# Patient Record
Sex: Male | Born: 1955
Health system: Southern US, Community
[De-identification: ages and names within clinical notes are randomized; demographics above are authoritative.]

## PROBLEM LIST (undated history)

## (undated) DIAGNOSIS — E119 Type 2 diabetes mellitus without complications: Secondary | ICD-10-CM

## (undated) DIAGNOSIS — N189 Chronic kidney disease, unspecified: Secondary | ICD-10-CM

## (undated) DIAGNOSIS — R011 Cardiac murmur, unspecified: Secondary | ICD-10-CM

## (undated) DIAGNOSIS — K219 Gastro-esophageal reflux disease without esophagitis: Secondary | ICD-10-CM

## (undated) DIAGNOSIS — I10 Essential (primary) hypertension: Secondary | ICD-10-CM

## (undated) DIAGNOSIS — N200 Calculus of kidney: Secondary | ICD-10-CM

## (undated) HISTORY — PX: COLONOSCOPY: SHX174

## (undated) HISTORY — DX: Type 2 diabetes mellitus without complications: E11.9

## (undated) HISTORY — DX: Calculus of kidney: N20.0

## (undated) HISTORY — PX: LITHOTRIPSY: SUR834

## (undated) HISTORY — DX: Cardiac murmur, unspecified: R01.1

---

## 2001-03-05 ENCOUNTER — Ambulatory Visit (HOSPITAL_COMMUNITY): Admission: RE | Admit: 2001-03-05 | Discharge: 2001-03-05 | Payer: Self-pay | Admitting: Pulmonary Disease

## 2009-05-12 ENCOUNTER — Encounter: Admission: RE | Admit: 2009-05-12 | Discharge: 2009-05-12 | Payer: Self-pay | Admitting: Urology

## 2009-10-06 ENCOUNTER — Emergency Department (HOSPITAL_COMMUNITY): Admission: EM | Admit: 2009-10-06 | Discharge: 2009-10-06 | Payer: Self-pay | Admitting: Family Medicine

## 2009-10-20 ENCOUNTER — Emergency Department (HOSPITAL_COMMUNITY): Admission: EM | Admit: 2009-10-20 | Discharge: 2009-10-20 | Payer: Self-pay | Admitting: Emergency Medicine

## 2010-11-18 LAB — DIFFERENTIAL
Basophils Absolute: 0.1 10*3/uL (ref 0.0–0.1)
Basophils Relative: 0 % (ref 0–1)
Basophils Relative: 1 % (ref 0–1)
Eosinophils Absolute: 0 10*3/uL (ref 0.0–0.7)
Eosinophils Relative: 2 % (ref 0–5)
Monocytes Absolute: 0.6 10*3/uL (ref 0.1–1.0)
Monocytes Absolute: 0.8 10*3/uL (ref 0.1–1.0)
Neutro Abs: 8.8 10*3/uL — ABNORMAL HIGH (ref 1.7–7.7)
Neutrophils Relative %: 61 % (ref 43–77)

## 2010-11-18 LAB — POCT URINALYSIS DIP (DEVICE)
Bilirubin Urine: NEGATIVE
Glucose, UA: NEGATIVE mg/dL
Hgb urine dipstick: NEGATIVE
Ketones, ur: 40 mg/dL — AB
Nitrite: NEGATIVE
Protein, ur: NEGATIVE mg/dL
Specific Gravity, Urine: 1.025 (ref 1.005–1.030)
pH: 7 (ref 5.0–8.0)

## 2010-11-18 LAB — POCT I-STAT, CHEM 8
BUN: 19 mg/dL (ref 6–23)
Calcium, Ion: 1.13 mmol/L (ref 1.12–1.32)
Calcium, Ion: 1.14 mmol/L (ref 1.12–1.32)
Chloride: 107 mEq/L (ref 96–112)
HCT: 41 % (ref 39.0–52.0)
Hemoglobin: 13.9 g/dL (ref 13.0–17.0)
Potassium: 3.6 mEq/L (ref 3.5–5.1)
Potassium: 3.8 mEq/L (ref 3.5–5.1)
TCO2: 29 mmol/L (ref 0–100)

## 2010-11-18 LAB — CBC
MCHC: 34.3 g/dL (ref 30.0–36.0)
Platelets: 155 10*3/uL (ref 150–400)
RDW: 13.4 % (ref 11.5–15.5)
WBC: 6.8 10*3/uL (ref 4.0–10.5)

## 2012-02-06 ENCOUNTER — Other Ambulatory Visit: Payer: Self-pay | Admitting: Urology

## 2012-02-09 ENCOUNTER — Encounter (HOSPITAL_COMMUNITY): Payer: Self-pay | Admitting: *Deleted

## 2012-02-09 NOTE — Progress Notes (Signed)
Asked bring folder sign all forms that have a patient signature and a nurse will co sign the form,insurance card,ID,take laxative between 5 pm and 6 pm and eat a light dinner 02-22-12.  Clear liquids until 0745 am then NPO for procedure 02-23-12  Do not take any Aspirin,Advil.Ibuprofen or Toradol 72 hours prior to procedure.  Arrive at 1145 am 02-23-12 to the [facility].   Able to verbalize all instructions without difficulty.

## 2012-02-21 ENCOUNTER — Encounter (HOSPITAL_COMMUNITY): Payer: Self-pay | Admitting: Pharmacy Technician

## 2012-02-23 ENCOUNTER — Ambulatory Visit (HOSPITAL_COMMUNITY): Payer: 59

## 2012-02-23 ENCOUNTER — Encounter (HOSPITAL_COMMUNITY)
Admission: RE | Disposition: A | Discharge status: Home / Self Care | Payer: Self-pay | Source: Ambulatory Visit | Attending: Urology

## 2012-02-23 ENCOUNTER — Ambulatory Visit (HOSPITAL_COMMUNITY)
Admission: RE | Admit: 2012-02-23 | Discharge: 2012-02-23 | Disposition: A | Discharge status: Home / Self Care | Payer: 59 | Source: Ambulatory Visit | Attending: Urology | Admitting: Urology

## 2012-02-23 ENCOUNTER — Encounter (HOSPITAL_COMMUNITY): Payer: Self-pay | Admitting: *Deleted

## 2012-02-23 DIAGNOSIS — N200 Calculus of kidney: Secondary | ICD-10-CM | POA: Insufficient documentation

## 2012-02-23 DIAGNOSIS — I10 Essential (primary) hypertension: Secondary | ICD-10-CM | POA: Insufficient documentation

## 2012-02-23 DIAGNOSIS — E119 Type 2 diabetes mellitus without complications: Secondary | ICD-10-CM | POA: Insufficient documentation

## 2012-02-23 HISTORY — DX: Essential (primary) hypertension: I10

## 2012-02-23 HISTORY — DX: Chronic kidney disease, unspecified: N18.9

## 2012-02-23 LAB — GLUCOSE, CAPILLARY: Glucose-Capillary: 88 mg/dL (ref 70–99)

## 2012-02-23 SURGERY — LITHOTRIPSY, ESWL
Anesthesia: LOCAL | Laterality: Left

## 2012-02-23 MED ORDER — DIPHENHYDRAMINE HCL 25 MG PO CAPS
ORAL_CAPSULE | ORAL | Status: AC
  Administered 2012-02-23: 25 mg via ORAL
  Filled 2012-02-23: qty 1

## 2012-02-23 MED ORDER — DIPHENHYDRAMINE HCL 25 MG PO CAPS
25.0000 mg | ORAL_CAPSULE | ORAL | Status: AC
  Administered 2012-02-23: 25 mg via ORAL

## 2012-02-23 MED ORDER — DIAZEPAM 5 MG PO TABS
10.0000 mg | ORAL_TABLET | ORAL | Status: AC
  Administered 2012-02-23: 10 mg via ORAL

## 2012-02-23 MED ORDER — DIAZEPAM 5 MG PO TABS
ORAL_TABLET | ORAL | Status: AC
  Administered 2012-02-23: 10 mg via ORAL
  Filled 2012-02-23: qty 2

## 2012-02-23 MED ORDER — DEXTROSE-NACL 5-0.45 % IV SOLN
INTRAVENOUS | Status: DC
  Administered 2012-02-23: 13:00:00 via INTRAVENOUS

## 2012-02-23 MED ORDER — OXYCODONE-ACETAMINOPHEN 5-325 MG PO TABS: 1.0000 | ORAL_TABLET | ORAL | Status: AC | PRN

## 2012-02-23 NOTE — H&P (Signed)
  Urology History and Physical Exam  CC: kidney stone  HPI: 56 year old male who presents at this time for lithotripsy. I saw him on 6 June, 2013 in followup for gross hematuria. Evaluation included a CT scan and cystoscopy. He has small renal cysts, but are branched left lower pole renal stone. Hounsfield unit was approximately 1100, skin to sun distance was only about 7 cm. He presents at this time for lithotripsy as primary therapy for his symptomatic left lower pole stone.  PMH: Past Medical History  Diagnosis Date  . Hypertension   . Diabetes mellitus   . Chronic kidney disease     PSH: Past Surgical History  Procedure Date  . Lithotripsy     3 yrs ago    Allergies: No Known Allergies  Medications: Prescriptions prior to admission  Medication Sig Dispense Refill  . amLODipine (NORVASC) 5 MG tablet Take 5 mg by mouth every morning.      Marland Kitchen atorvastatin (LIPITOR) 10 MG tablet Take 10 mg by mouth every evening.      Marland Kitchen lisinopril (PRINIVIL,ZESTRIL) 10 MG tablet Take 10 mg by mouth every morning.      . metFORMIN (GLUMETZA) 500 MG (MOD) 24 hr tablet Take 500 mg by mouth daily with breakfast.      . Multiple Vitamins-Minerals (CENTRUM SILVER PO) Take 1 tablet by mouth daily.      Marland Kitchen acetaminophen (TYLENOL) 500 MG tablet Take 1,000 mg by mouth every 6 (six) hours as needed. PAIN      . ascorbic acid (VITAMIN C) 500 MG tablet Take 500 mg by mouth daily.      Marland Kitchen aspirin EC 81 MG tablet Take 81 mg by mouth daily.      . sildenafil (VIAGRA) 100 MG tablet Take 50 mg by mouth daily as needed. ED         Social History: History   Social History  . Marital Status: Married    Spouse Name: N/A    Number of Children: N/A  . Years of Education: N/A   Occupational History  . Not on file.   Social History Main Topics  . Smoking status: Never Smoker   . Smokeless tobacco: Not on file  . Alcohol Use: No  . Drug Use: No  . Sexually Active: Yes   Other Topics Concern  . Not on  file   Social History Narrative  . No narrative on file    Family History: History reviewed. No pertinent family history.  Review of Systems: Genitourinary, constitutional, skin, eye, otolaryngeal, hematologic/lymphatic, cardiovascular, pulmonary, endocrine, musculoskeletal, gastrointestinal, neurological and psychiatric system(s) were reviewed and pertinent findings if present are noted.  Genitourinary: hematuria.     Physical Exam: @VITALS2 @ General: No acute distress.  Awake. Head:  Normocephalic.  Atraumatic. ENT:  EOMI.  Mucous membranes moist Neck:  Supple.  No lymphadenopathy. CV:  S1 present. S2 present. Regular rate. Pulmonary: Equal effort bilaterally.  Clear to auscultation bilaterally. Abdomen: Soft.  Non- tender to palpation. Skin:  Normal turgor.  No visible rash.   Studies:  No results found for this basename: HGB:2,WBC:2,PLT:2 in the last 72 hours  No results found for this basename: NA:2,K:2,CL:2,CO2:2,BUN:2,CREATININE:2,CALCIUM:2,MAGNESIUM:2,GFRNONAA:2,GFRAA:2 in the last 72 hours   No results found for this basename: PT:2,INR:2,APTT:2 in the last 72 hours   No components found with this basename: ABG:2    Assessment:  Left lower pole renal calculus  Plan: lithotripsy

## 2012-02-23 NOTE — Discharge Instructions (Signed)
See [facility] discharge instructions in chart. Lithotripsy for Kidney Stones WHAT ARE KIDNEY STONES? The kidneys filter blood for chemicals the body cannot use. These waste chemicals are eliminated in the urine. They are removed from the body. Under some conditions, these chemicals may become concentrated. When this happens, they form crystals in the urine. When these crystals build up and stick together, stones may form. When these stones block the flow of urine through the urinary tract, they may cause severe pain. The urinary tract is very sensitive to blockage and stretching by the stone. WHAT IS LITHOTRIPSY? Lithotripsy is a treatment that can sometimes help eliminate kidney stones and pain faster. A form of lithotripsy, also known as ESWL (extracorporeal shock wave lithotripsy), is a nonsurgical procedure that helps your body rid itself of the kidney stone with a minimum amount of pain. EWSL is a method of crushing a kidney stone with shock waves. These shock waves pass through your body. They cause the kidney stones to crumble while still in the urinary tract. It is then easier for the smaller pieces of stone to pass in the urine. Lithotripsy usually takes about an hour. It is done in a hospital, a lithotripsy center, or a mobile unit. It usually does not require an overnight stay. Your caregiver will instruct you on preparation for the procedure. Your caregiver will tell you what to expect afterward. LET YOUR CAREGIVER KNOW ABOUT:  Allergies.   Medicines taken including herbs, eye drops, over the counter medicines (including aspirin, aleve, or motrin for treatment of inflammatory conditions) and creams.   Use of steroids (by mouth or creams).   Previous problems with anesthetics or novocaine.   Possibility of pregnancy, if this applies.   History of blood clots (thrombophlebitis).   History of bleeding or blood problems.   Previous surgery.   Other health problems.  RISKS  AND COMPLICATIONS Complications of lithotripsy are uncommon, but include the following:  Infection.   Bleeding of the kidney.   Bruising of the kidney or skin.   Obstruction of the ureter (the passageway from the kidney to the bladder).   Failure of the stone to fragment (break apart).  PROCEDURE A stent (flexible tube with holes) may be placed in your ureter. The ureter is the tube that transports the urine from the kidneys to the bladder. Your caregiver may place a stent before the procedure. This will help keep urine flowing from the kidney if the fragments of the stone block the ureter. You may receive an intravenous (IV) line to give you fluids and medicines. These medicines may help you relax or make you sleep. During the procedure, you will lie comfortably on a fluid-filled cushion or in a warm-water bath. After an x-ray or ultrasound locates your stone, shock waves are aimed at the stone. If you are awake, you may feel a tapping sensation (feeling) as the shock waves pass through your body. If large stone particles remain after treatment, a second procedure may be necessary at a later date. For comfort during the test:  Relax as much as possible.   Try to remain still as much as possible.   Try to follow instructions to speed up the test.   Let your caregiver know if you are uncomfortable, anxious, or in pain.  AFTER THE PROCEDURE  After surgery, you will be taken to the recovery area. A nurse will watch and check your progress. Once you're awake, stable, and taking fluids well, you will be  allowed to go home as long as there are no problems. You may be prescribed antibiotics (medicines that kill germs) to help prevent infection. You may also be prescribed pain medicine if needed. In a week or two, your doctor may remove your stent, if you have one. Your caregiver will check to see whether or not stone particles remain. PASSING THE STONE It may take anywhere from a day to several  weeks for the stone particles to leave your body. During this time, drink at least 8 to 12 eight ounce glasses of water every day. It is normal for your urine to be cloudy or slightly bloody for a few weeks following this procedure. You may even see small pieces of stone in your urine. A slight fever and some pain are also normal. Your caregiver may ask you to strain your urine to collect some stone particles for chemical analysis. If you find particles while straining the urine, save them. Analysis tells you and the caregiver what the stone is made of. Knowing this may help prevent future stones. PREVENTING FUTURE STONES  Drink about 8 to 12, eight-ounce glasses of water every day.   Follow the diet your caregiver recommends.   Take your prescribed medicine.   See your caregiver regularly for checkups.  SEEK IMMEDIATE MEDICAL CARE IF:  You develop an oral temperature above 102 F (38.9 C), or as your caregiver suggests.   Your pain is not relieved by medicine.   You develop nausea (feeling sick to your stomach) and vomiting.   You develop heavy bleeding.   You have difficulty urinating.  Document Released: 08/12/2000 Document Revised: 08/04/2011 Document Reviewed: 06/06/2008 [facility] Patient Information 2012 [place], [place].

## 2012-02-24 ENCOUNTER — Encounter (HOSPITAL_COMMUNITY): Payer: Self-pay

## 2016-12-20 ENCOUNTER — Ambulatory Visit (INDEPENDENT_AMBULATORY_CARE_PROVIDER_SITE_OTHER)

## 2016-12-20 ENCOUNTER — Ambulatory Visit (HOSPITAL_COMMUNITY)
Admission: RE | Admit: 2016-12-20 | Discharge: 2016-12-20 | Disposition: A | Discharge status: Home / Self Care | Payer: Commercial Managed Care - HMO | Source: Ambulatory Visit | Attending: Physician Assistant | Admitting: Physician Assistant

## 2016-12-20 ENCOUNTER — Ambulatory Visit: Payer: Self-pay

## 2016-12-20 ENCOUNTER — Ambulatory Visit: Admitting: Physician Assistant

## 2016-12-20 VITALS — BP 127/85 | HR 71 | Temp 98.4°F | Resp 17 | Ht 69.5 in | Wt 168.0 lb

## 2016-12-20 DIAGNOSIS — R51 Headache: Secondary | ICD-10-CM

## 2016-12-20 DIAGNOSIS — M542 Cervicalgia: Secondary | ICD-10-CM

## 2016-12-20 DIAGNOSIS — S43102A Unspecified dislocation of left acromioclavicular joint, initial encounter: Secondary | ICD-10-CM

## 2016-12-20 DIAGNOSIS — M25512 Pain in left shoulder: Secondary | ICD-10-CM

## 2016-12-20 DIAGNOSIS — E785 Hyperlipidemia, unspecified: Secondary | ICD-10-CM | POA: Insufficient documentation

## 2016-12-20 DIAGNOSIS — F0781 Postconcussional syndrome: Secondary | ICD-10-CM

## 2016-12-20 DIAGNOSIS — R519 Headache, unspecified: Secondary | ICD-10-CM

## 2016-12-20 DIAGNOSIS — I152 Hypertension secondary to endocrine disorders: Secondary | ICD-10-CM | POA: Insufficient documentation

## 2016-12-20 DIAGNOSIS — E1169 Type 2 diabetes mellitus with other specified complication: Secondary | ICD-10-CM | POA: Insufficient documentation

## 2016-12-20 DIAGNOSIS — I10 Essential (primary) hypertension: Secondary | ICD-10-CM | POA: Insufficient documentation
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
## 2016-12-20 NOTE — Patient Instructions (Addendum)
Return for follow up with me in 48 hours.   Post-Concussion Syndrome Post-concussion syndrome is the symptoms that can occur after a head injury. These symptoms can last from weeks to months. Follow these instructions at home:  Take medicines only as told by your doctor.  Do not take aspirin.  Sleep with your head raised to help with headaches.  Avoid activities that can cause another head injury.  Do not play contact sports like football, hockey, soccer, or basketball.  Do not do other risky activities like downhill skiing, martial arts, or horseback riding until your doctor says it is okay.  Keep all follow-up visits as told by your doctor. This is important. Contact a doctor if:  You have a harder time:  Paying attention.  Focusing.  Remembering.  Learning new information.  Dealing with stress.  You need more time to complete tasks.  You are easily bothered (irritable).  You have more symptoms. Get help if you have any of these symptoms for more than two weeks after your injury:  Long-lasting (chronic) headaches.  Dizziness.  Trouble balancing.  Feeling sick to your stomach (nauseous).  Trouble with your vision.  Noise or light bothers you more.  Depression.  Mood swings.  Feeling worried (anxious).  Easily bothered.  Memory problems.  Trouble concentrating or paying attention.  Sleep problems.  Feeling tired all of the time. Get help right away if:  You feel confused.  You feel very sleepy.  You are hard to wake up.  You feel sick to your stomach.  You keep throwing up (vomiting).  You feel like you are moving when you are not (vertigo).  Your eyes move back and forth very quickly.  You start shaking (convulsing) or pass out (faint).  You have very bad headaches that do not get better with medicine.  You cannot use your arms or legs like normal.  One of the black centers of your eyes (pupils) is bigger than the other.  You  have clear or bloody fluid coming from your nose or ears.  Your problems get worse, not better. This information is not intended to replace advice given to you by your health care provider. Make sure you discuss any questions you have with your health care provider. Document Released: 09/22/2004 Document Revised: 01/21/2016 Document Reviewed: 11/20/2013 Elsevier Interactive Patient Education  2017 [employer]  Acromioclavicular Separation Acromioclavicular separation is an injury to the small joint at the top of the shoulder (acromioclavicular joint or AC joint). The [facility] joint connects the outer tip of the collarbone (clavicle) to the top of the shoulder blade (acromion). Two strong cords of tissue (acromioclavicular ligament and coracoclavicular ligament) stretch across the [facility] joint to keep it in place. An AC joint separation happens when one or both ligaments stretch or tear, causing the joint to separate. There are six types of separation. The type of separation that you have depends on how much the ligaments are damaged and how far the joint has moved out of place. The less severe types of separation are most common. What are the causes? Common causes of this condition include:  A hard, direct hit (blow) to the top of the shoulder.  Falling on the shoulder.  Falling on an outstretched arm. What increases the risk? This condition is more likely to develop in male athletes under age 28 who participate in sports that involve potential contact, such as:  Football.  Rugby.  Hockey.  Cycling.  Martial arts. What are  the signs or symptoms?   The main symptom of this condition is shoulder pain. Pain may be mild or severe, depending on the type of separation. Other signs and symptoms in the shoulder may include:  Swelling.  Limited range of motion, especially when moving the arm across the body.  Pain and tenderness when touching the top of the shoulder.  Pain when putting weight  on the shoulder, such as rolling on the shoulder while sleeping.  A visible bump (deformity) over the joint.  A clicking or popping sound when moving the shoulder. How is this diagnosed? This condition may be diagnosed based on:  Your symptoms.  Your medical history, including your history of recent injuries.  A physical exam to check for deformity and limited range of motion.  Imaging tests, such as:  X-rays.  MRI.  Ultrasound. How is this treated? Treatment for this condition may include:  Resting the shoulder before gradually returning to normal activities.  Icing the shoulder.  NSAIDs to help reduce pain and swelling.  A sling to support your shoulder and keep it from moving.  Physical therapy.  Surgery. This is rare. Surgery may be needed for severe injuries that include breaks (fractures) in a bone, or injuries that do not get better with nonsurgical treatments. Surgery is followed by keeping your joint in place for a period of time (immobilization) and physical therapy. Follow these instructions at home: If you have a sling:   Wear the sling as told by your health care provider. Remove it only as told by your health care provider.  Reposition the sling if your fingers tingle, become numb, or turn cold and blue.  Do not let your sling get wet if it is not waterproof. Ask your health care provider if you can remove the sling for bathing and showering.  Keep the sling clean. Managing pain, stiffness, and swelling    If directed, apply ice to the injured area.  Put ice in a plastic bag.  Place a towel between your skin and the bag.  Leave the ice on for 20 minutes, 2-3 times a day.  Move your fingers often to avoid stiffness and to lessen swelling. Driving   Do not drive or operate heavy machinery while taking prescription pain medicine.  Ask your health care provider when it is safe for you to drive. Activity   Rest and return to your normal  activities as told by your health care provider. Ask your health care provider what activities are safe for you.  Do exercises as told by your health care provider. General instructions   Do not use any tobacco products, such as cigarettes, chewing tobacco, and e-cigarettes. Tobacco can delay healing. If you need help quitting, ask your health care provider.  Take over-the-counter and prescription medicines only as told by your health care provider.  Keep all follow-up visits as told by your health care provider. This is important. How is this prevented?  Make sure to use equipment that fits you.  Wear shoulder padding during contact sports.  Be safe and responsible while being active to avoid falls. Contact a health care provider if:  Your pain and stiffness do not improve after 2 weeks. This information is not intended to replace advice given to you by your health care provider. Make sure you discuss any questions you have with your health care provider. Document Released: 08/15/2005 Document Revised: 04/21/2016 Document Reviewed: 07/18/2015 Elsevier Interactive Patient Education  2017 [employer].  IF you received an x-ray today, you will receive an invoice from [facility]. Please contact [facility] at [phone] with questions or concerns regarding your invoice.   IF you received labwork today, you will receive an invoice from [facility]. Please contact [facility] at [phone] with questions or concerns regarding your invoice.   Our billing staff will not be able to assist you with questions regarding bills from these companies.  You will be contacted with the lab results as soon as they are available. The fastest way to get your results is to activate your My Chart account. Instructions are located on the last page of this paperwork. If you have not heard from us regarding the results in 2 weeks, please contact this office.     

## 2016-12-21 ENCOUNTER — Telehealth: Payer: Self-pay | Admitting: Physician Assistant
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
## 2016-12-22 ENCOUNTER — Other Ambulatory Visit: Payer: Commercial Managed Care - HMO

## 2016-12-22 NOTE — Telephone Encounter (Signed)
I have completed the paperwork and placed it with RN Santiago Glad to be faxed.

## 2016-12-22 NOTE — Telephone Encounter (Signed)
Faxed

## 2016-12-23 ENCOUNTER — Ambulatory Visit (INDEPENDENT_AMBULATORY_CARE_PROVIDER_SITE_OTHER)

## 2016-12-23 ENCOUNTER — Encounter: Payer: Self-pay | Admitting: Physician Assistant

## 2016-12-23 ENCOUNTER — Ambulatory Visit (INDEPENDENT_AMBULATORY_CARE_PROVIDER_SITE_OTHER): Admitting: Physician Assistant

## 2016-12-23 VITALS — BP 136/88 | HR 79 | Temp 98.4°F | Resp 16 | Wt 173.2 lb

## 2016-12-23 DIAGNOSIS — S43102D Unspecified dislocation of left acromioclavicular joint, subsequent encounter: Secondary | ICD-10-CM

## 2016-12-23 DIAGNOSIS — S43102A Unspecified dislocation of left acromioclavicular joint, initial encounter: Secondary | ICD-10-CM

## 2016-12-23 DIAGNOSIS — F0781 Postconcussional syndrome: Secondary | ICD-10-CM
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
## 2016-12-23 NOTE — Patient Instructions (Addendum)
Plain films suggest a type 1 AC joint separation. This typically takes anywhere from 3 days to 2 weeks to fully heal.   I recommend you rest, ice, and protect the arm in a sling. Ice can be applied for 15 minutes every four to six hours as needed. Rest includes avoiding overhead reaching, reaching across the chest, lifting, leaning on the elbows, and sleeping directly on the shoulder.  Range-of-motion exercises are recommended as soon as they can be tolerated. They are provided below.   Follow up with me on 01/03/17.     Acromioclavicular Separation Acromioclavicular separation is an injury to the small joint at the top of the shoulder (acromioclavicular joint or AC joint). The [facility] joint connects the outer tip of the collarbone (clavicle) to the top of the shoulder blade (acromion). Two strong cords of tissue (acromioclavicular ligament and coracoclavicular ligament) stretch across the [facility] joint to keep it in place. An AC joint separation happens when one or both ligaments stretch or tear, causing the joint to separate. There are six types of separation. The type of separation that you have depends on how much the ligaments are damaged and how far the joint has moved out of place. The less severe types of separation are most common. What are the causes? Common causes of this condition include:  A hard, direct hit (blow) to the top of the shoulder.  Falling on the shoulder.  Falling on an outstretched arm. What increases the risk? This condition is more likely to develop in male athletes under age 48 who participate in sports that involve potential contact, such as:  Football.  Rugby.  Hockey.  Cycling.  Martial arts. What are the signs or symptoms?   The main symptom of this condition is shoulder pain. Pain may be mild or severe, depending on the type of separation. Other signs and symptoms in the shoulder may include:  Swelling.  Limited range of motion, especially when moving the  arm across the body.  Pain and tenderness when touching the top of the shoulder.  Pain when putting weight on the shoulder, such as rolling on the shoulder while sleeping.  A visible bump (deformity) over the joint.  A clicking or popping sound when moving the shoulder. How is this diagnosed? This condition may be diagnosed based on:  Your symptoms.  Your medical history, including your history of recent injuries.  A physical exam to check for deformity and limited range of motion.  Imaging tests, such as:  X-rays.  MRI.  Ultrasound. How is this treated? Treatment for this condition may include:  Resting the shoulder before gradually returning to normal activities.  Icing the shoulder.  NSAIDs to help reduce pain and swelling.  A sling to support your shoulder and keep it from moving.  Physical therapy.  Surgery. This is rare. Surgery may be needed for severe injuries that include breaks (fractures) in a bone, or injuries that do not get better with nonsurgical treatments. Surgery is followed by keeping your joint in place for a period of time (immobilization) and physical therapy. Follow these instructions at home: If you have a sling:   Wear the sling as told by your health care provider. Remove it only as told by your health care provider.  Reposition the sling if your fingers tingle, become numb, or turn cold and blue.  Do not let your sling get wet if it is not waterproof. Ask your health care provider if you can remove the sling  for bathing and showering.  Keep the sling clean. Managing pain, stiffness, and swelling    If directed, apply ice to the injured area.  Put ice in a plastic bag.  Place a towel between your skin and the bag.  Leave the ice on for 20 minutes, 2-3 times a day.  Move your fingers often to avoid stiffness and to lessen swelling. Driving   Do not drive or operate heavy machinery while taking prescription pain medicine.  Ask  your health care provider when it is safe for you to drive. Activity   Rest and return to your normal activities as told by your health care provider. Ask your health care provider what activities are safe for you.  Do exercises as told by your health care provider. General instructions   Do not use any tobacco products, such as cigarettes, chewing tobacco, and e-cigarettes. Tobacco can delay healing. If you need help quitting, ask your health care provider.  Take over-the-counter and prescription medicines only as told by your health care provider.  Keep all follow-up visits as told by your health care provider. This is important. How is this prevented?  Make sure to use equipment that fits you.  Wear shoulder padding during contact sports.  Be safe and responsible while being active to avoid falls. Contact a health care provider if:  Your pain and stiffness do not improve after 2 weeks. This information is not intended to replace advice given to you by your health care provider. Make sure you discuss any questions you have with your health care provider. Document Released: 08/15/2005 Document Revised: 04/21/2016 Document Reviewed: 07/18/2015 Elsevier Interactive Patient Education  2017 [place] After Surgery Ask your health care provider which exercises are safe for you. Do exercises exactly as told by your health care provider and adjust them as directed. It is normal to feel mild stretching, pulling, tightness, or discomfort as you do these exercises, but you should stop right away if you feel sudden pain or your pain gets worse. Do not begin these exercises until told by your health care provider. Stretching and range of motion exercises These exercises warm up your muscles and joints and improve the movement and flexibility of your shoulder. These exercises also help to relieve pain, numbness, and tingling. Exercise A: Pendulum    1. Stand near a wall or a surface that you can hold onto for balance. 2. Bend at the waist and let your left / right arm hang straight down. Use your other arm to keep your balance. 3. Relax your arm and shoulder muscles, and move your hips and your trunk so your left / right arm swings freely. Your arm should swing because of the motion of your body, not because you are using your arm or shoulder muscles. 4. Keep moving so your arm swings in the following directions, as told by your health care provider:  Side to side.  Forward and backward.  In clockwise and counterclockwise circles. Repeat __________ times, or for __________ seconds per direction. Complete this exercise __________ times a day. Exercise B: Flexion, standing   1. Stand and hold a broomstick, a cane, or a similar object with your hands. Place your hands a little more than shoulder-width apart on the object. Your left / right hand should be palm-up, and your other hand should be palm-down. 2. Push the stick down with your healthy arm to raise your left / right arm in front  of your body, and then over your head. Use your other hand to help move the stick. Stop when you feel a stretch in your shoulder, or when you reach the angle that is recommended by your health care provider.  Avoid shrugging your shoulder while you raise your arm. Keep your shoulder blade tucked down toward your spine. 3. Hold for __________ seconds. 4. Slowly return to the starting position. Repeat __________ times. Complete this exercise __________ times a day. Exercise C: Flexion, seated   1. Sit in a stable chair so your left / right forearm can rest on a flat surface. Your elbow should rest at a height that keeps your upper arm next to your body. 2. Keeping your shoulder relaxed, lean forward at the waist and let your hand slide forward. Stop when you feel a stretch in your shoulder, or when you reach the angle that is recommended by your health care  provider. 3. Hold for __________ seconds. 4. Slowly return to the starting position. Repeat __________ times. Complete this exercise __________ times a day. Strengthening exercises These exercises build strength and endurance in your shoulder. Endurance is the ability to use your muscles for a long time, even after they get tired. Exercise D: Shoulder abduction, isometric   1. Stand or sit about 4-6 inches (10-15 cm) away from a wall with your right/left side facing the wall. 2. Bend your left / right elbow and gently press your elbow against the wall as if you are trying to move your arm out to your side. Gradually increase the pressure until you are pressing as hard as you can without shrugging your shoulder. 3. Hold for __________ seconds. 4. Slowly release the tension and relax your muscles completely before you repeat the exercise. Repeat __________ times. Complete this exercise __________ times a day. Exercise E: Internal rotation, isometric   1. Stand or sit in a doorway, facing the door frame. 2. Bend your left / right elbow and place the palm of your hand against the door frame. Only your palm should be touching the frame. Keep your upper arm at your side. 3. Gently press your hand against the door frame, as if you are trying to push your arm toward your abdomen.  Avoid shrugging your shoulder while you press your hand into the door frame. Keep your shoulder blade tucked down toward the middle of your back. 4. Hold for __________ seconds. 5. Slowly release the tension, and relax your muscles completely before you repeat the exercise. Repeat __________ times. Complete this exercise __________ times a day. Exercise F: External rotation, isometric   1. Stand or sit in a doorway, facing the door frame. 2. Bend your left / right elbow and place the back of your wrist against the door frame. Only the back of your wrist should be touching the frame. Keep your upper arm at your  side. 3. Gently press your wrist against the door frame, as if you are trying to push your arm away from your abdomen.  Avoid shrugging your shoulder while you press your wrist into the door frame. Keep your shoulder blade tucked down toward the middle of your back. 4. Hold for __________ seconds. 5. Slowly release the tension, and relax your muscles completely before you repeat the exercise. Repeat __________ times. Complete this exercise __________ times a day. Exercise G: Internal rotation   1. Sit in a stable chair without armrests, or stand. 2. Secure an exercise band at elbow height at your  left / right side. 3. Place a soft object, such as a folded towel or a small pillow, between your left / right upper arm and your body to move your elbow a few inches (about 10 cm) away from your side. 4. Hold the end of the exercise band so it stretches. 5. Keeping your elbow pressed against the soft object, move your forearm in, toward your abdomen. Keep your body steady so the movement is only coming from your shoulder. 6. Hold for __________ seconds. 7. Slowly return to the starting position. Repeat __________ times. Complete this exercise __________ times a day. Exercise H: External rotation   1. Sit in a stable chair without armrests, or stand. 2. Secure an exercise band at elbow height on your left / right side. 3. Place a soft object, such as a folded towel or a small pillow, between your left / right upper arm and your body to move your elbow a few inches (about 10 cm) away from your side. 4. Hold the end of the exercise band so it stretches. 5. Keeping your elbow pressed against the soft object, move your forearm out, away from your abdomen. Keep your body steady so the movement is only coming from your shoulder. 6. Hold for __________ seconds. 7. Slowly return to the starting position. Repeat __________ times. Complete this exercise __________ times a day. This information is not  intended to replace advice given to you by your health care provider. Make sure you discuss any questions you have with your health care provider. Document Released: 03/16/2005 Document Revised: 04/21/2016 Document Reviewed: 08/29/2015 Elsevier Interactive Patient Education  2017 [employer].    IF you received an x-ray today, you will receive an invoice from [facility]. Please contact [facility] at [phone] with questions or concerns regarding your invoice.   IF you received labwork today, you will receive an invoice from [facility]. Please contact [facility] at [phone] with questions or concerns regarding your invoice.   Our billing staff will not be able to assist you with questions regarding bills from these companies.  You will be contacted with the lab results as soon as they are available. The fastest way to get your results is to activate your My Chart account. Instructions are located on the last page of this paperwork. If you have not heard from [place] regarding the results in 2 weeks, please contact this office.

## 2017-01-03 ENCOUNTER — Encounter: Payer: Self-pay | Admitting: Physician Assistant

## 2017-01-03 ENCOUNTER — Ambulatory Visit (INDEPENDENT_AMBULATORY_CARE_PROVIDER_SITE_OTHER): Admitting: Physician Assistant

## 2017-01-03 ENCOUNTER — Telehealth: Payer: Self-pay | Admitting: Physician Assistant

## 2017-01-03 VITALS — BP 143/81 | HR 81 | Temp 97.5°F | Resp 16 | Ht 69.5 in | Wt 171.6 lb

## 2017-01-03 DIAGNOSIS — S43102D Unspecified dislocation of left acromioclavicular joint, subsequent encounter: Secondary | ICD-10-CM

## 2017-01-03 NOTE — Patient Instructions (Addendum)
You may return to work but I want you to limit the overhead activity you do.Also I want you to go light with lifting. If you ever start to have pain, stop the activity and progress towards lifting that amount again. Please continue to ice after work if you are doing a lot of lifting that day. I also want you to continue doing the stretches at home. Please follow up in one week. Thank you for letting me participate in your health and well being.   Per our medical website:  Most patients return to full activities between three days and two weeks following injury, although throwing and overhead athletes (eg, baseball pitchers, tennis, and volleyball players) may require two to six weeks of progressive rehabilitation before they can return to high level overhead activity [7]. Complete healing and remodeling of the injured ligaments may take four to six weeks. Type I injuries generally heal without deformity and there is no increased risk of reinjury once completely healed.       IF you received an x-ray today, you will receive an invoice from [facility]. Please contact [facility] at [phone] with questions or concerns regarding your invoice.   IF you received labwork today, you will receive an invoice from [place]. Please contact [facility] at [phone] with questions or concerns regarding your invoice.   Our billing staff will not be able to assist you with questions regarding bills from these companies.  You will be contacted with the lab results as soon as they are available. The fastest way to get your results is to activate your My Chart account. Instructions are located on the last page of this paperwork. If you have not heard from [place] regarding the results in 2 weeks, please contact this office.
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
## 2017-01-03 NOTE — Telephone Encounter (Signed)
Patient came in for [facility] on 01/03/17 he brought in forms that were to be completed by [place] and taken back to his boss at work, he forgot and left them here. Once we located them they had not been completed so Larene Beach has placed the forms in [place] box for her to complete on 01/04/17 they need to be done ASAP and faxed to [phone].  Please complete as soon as you can when you come in his boss is waiting for them.

## 2017-01-04 NOTE — Telephone Encounter (Signed)
I have completed the forms and given them to [employer] to fax.

## 2017-01-04 NOTE — Telephone Encounter (Signed)
Advised [place] once forms completed, placed in nurses' box.

## 2017-01-04 NOTE — Telephone Encounter (Signed)
Completed forms faxed to [phone].

## 2017-01-10 ENCOUNTER — Encounter: Payer: Self-pay | Admitting: Physician Assistant

## 2017-01-10 ENCOUNTER — Ambulatory Visit (INDEPENDENT_AMBULATORY_CARE_PROVIDER_SITE_OTHER): Admitting: Physician Assistant

## 2017-01-10 VITALS — Ht 69.5 in

## 2017-01-10 DIAGNOSIS — S43102D Unspecified dislocation of left acromioclavicular joint, subsequent encounter: Secondary | ICD-10-CM

## 2017-01-10 NOTE — Patient Instructions (Addendum)
Decrease the work you are going to do this week by 50% of what you did last week. I want you to make sure you ice your arm at least 3 times the night after you work (20 minutes on, 20 minutes off). Continue doing exercises. I have placed a referral to physical therapy and they should contact you within the next week with an appointment. Plan for follow up with me in one week, unless PT can see you in the next week. If they see you in the next week, follow up with me in 2 weeks. Use tylenol as needed for pain. Thank you for letting me participate in your health and well being.   IF you received an x-ray today, you will receive an invoice from [facility]. Please contact [facility] at [phone] with questions or concerns regarding your invoice.   IF you received labwork today, you will receive an invoice from [place]. Please contact [facility] at [phone] with questions or concerns regarding your invoice.   Our billing staff will not be able to assist you with questions regarding bills from these companies.  You will be contacted with the lab results as soon as they are available. The fastest way to get your results is to activate your My Chart account. Instructions are located on the last page of this paperwork. If you have not heard from [place] regarding the results in 2 weeks, please contact this office.
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
## 2017-01-24 ENCOUNTER — Ambulatory Visit: Payer: Self-pay | Admitting: Physician Assistant

## 2017-01-25 ENCOUNTER — Encounter: Payer: Self-pay | Admitting: Physician Assistant

## 2017-01-25 ENCOUNTER — Ambulatory Visit (INDEPENDENT_AMBULATORY_CARE_PROVIDER_SITE_OTHER): Admitting: Physician Assistant

## 2017-01-25 ENCOUNTER — Ambulatory Visit: Payer: Commercial Managed Care - HMO

## 2017-01-25 VITALS — BP 118/76 | HR 76 | Temp 98.8°F | Resp 16 | Ht 69.5 in | Wt 168.0 lb

## 2017-01-25 DIAGNOSIS — S43102D Unspecified dislocation of left acromioclavicular joint, subsequent encounter: Secondary | ICD-10-CM

## 2017-01-25 MED ORDER — DICLOFENAC SODIUM 1 % TD GEL: 2.0000 g | Freq: Two times a day (BID) | TRANSDERMAL | 0 refills | Status: DC

## 2017-01-25 NOTE — Patient Instructions (Addendum)
Continue following work restrictions. You can advance activity as recommended per physical therapy. Please contact our office later this week if the Dept of Labor needs [place] to fax anything over to support your need for PT. In terms of the shoulder start using gel on affected area twice daily when you are working (before and after work). And continue icing after work. It was a pleasure seeing you today. Please follow up with me in office one week after your first PT visit. Thank you for letting me participate in your health and well being.     IF you received an x-ray today, you will receive an invoice from [facility]. Please contact [facility] at [phone] with questions or concerns regarding your invoice.   IF you received labwork today, you will receive an invoice from [place]. Please contact [facility] at [phone] with questions or concerns regarding your invoice.   Our billing staff will not be able to assist you with questions regarding bills from these companies.  You will be contacted with the lab results as soon as they are available. The fastest way to get your results is to activate your My Chart account. Instructions are located on the last page of this paperwork. If you have not heard from [place] regarding the results in 2 weeks, please contact this office.
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
## 2017-02-01 ENCOUNTER — Telehealth: Payer: Self-pay | Admitting: Physician Assistant
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
## 2017-02-02 ENCOUNTER — Ambulatory Visit (INDEPENDENT_AMBULATORY_CARE_PROVIDER_SITE_OTHER): Admitting: Physician Assistant

## 2017-02-02 ENCOUNTER — Encounter: Payer: Self-pay | Admitting: Physician Assistant

## 2017-02-02 VITALS — BP 120/70 | HR 89 | Temp 98.5°F | Resp 18 | Ht 69.53 in | Wt 173.2 lb

## 2017-02-02 DIAGNOSIS — S43102D Unspecified dislocation of left acromioclavicular joint, subsequent encounter: Secondary | ICD-10-CM

## 2017-02-02 NOTE — Patient Instructions (Addendum)
Please pick up voltaren gel and use as prescribed. Continue following work restrictions until you are evaluated by PT. Continue icing as needed. Thank you for letting me participate in your health and well being.    IF you received an x-ray today, you will receive an invoice from [facility]. Please contact [facility] at [phone] with questions or concerns regarding your invoice.   IF you received labwork today, you will receive an invoice from [place]. Please contact [facility] at [phone] with questions or concerns regarding your invoice.   Our billing staff will not be able to assist you with questions regarding bills from these companies.  You will be contacted with the lab results as soon as they are available. The fastest way to get your results is to activate your My Chart account. Instructions are located on the last page of this paperwork. If you have not heard from [place] regarding the results in 2 weeks, please contact this office.
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
## 2017-02-03 ENCOUNTER — Telehealth: Payer: Self-pay | Admitting: General Practice

## 2017-02-03 NOTE — Telephone Encounter (Signed)
He was evaluated today in office and we discussed this today.

## 2017-02-22 ENCOUNTER — Ambulatory Visit (INDEPENDENT_AMBULATORY_CARE_PROVIDER_SITE_OTHER): Payer: Commercial Managed Care - HMO | Admitting: Physician Assistant

## 2017-02-22 ENCOUNTER — Encounter: Payer: Self-pay | Admitting: Physician Assistant

## 2017-02-22 VITALS — BP 131/82 | HR 86 | Temp 97.8°F | Resp 18 | Ht 70.47 in | Wt 172.0 lb

## 2017-02-22 DIAGNOSIS — E785 Hyperlipidemia, unspecified: Secondary | ICD-10-CM

## 2017-02-22 DIAGNOSIS — E119 Type 2 diabetes mellitus without complications: Secondary | ICD-10-CM | POA: Diagnosis not present

## 2017-02-22 DIAGNOSIS — I10 Essential (primary) hypertension: Secondary | ICD-10-CM | POA: Diagnosis not present

## 2017-02-22 LAB — POCT URINALYSIS DIP (MANUAL ENTRY)
Bilirubin, UA: NEGATIVE
GLUCOSE UA: NEGATIVE mg/dL
Leukocytes, UA: NEGATIVE
NITRITE UA: NEGATIVE
PH UA: 5.5 (ref 5.0–8.0)
SPEC GRAV UA: 1.02 (ref 1.010–1.025)
UROBILINOGEN UA: 0.2 U/dL

## 2017-02-22 LAB — POCT GLYCOSYLATED HEMOGLOBIN (HGB A1C): Hemoglobin A1C: 6.1

## 2017-02-22 MED ORDER — METFORMIN HCL ER (MOD) 500 MG PO TB24: 500.0000 mg | ORAL_TABLET | Freq: Every day | ORAL | 1 refills | Status: DC

## 2017-02-22 MED ORDER — AMLODIPINE BESYLATE 5 MG PO TABS: 5.0000 mg | ORAL_TABLET | Freq: Every morning | ORAL | 1 refills | Status: DC

## 2017-02-22 MED ORDER — LISINOPRIL 10 MG PO TABS: 10.0000 mg | ORAL_TABLET | Freq: Every morning | ORAL | 1 refills | Status: DC

## 2017-02-22 MED ORDER — ATORVASTATIN CALCIUM 10 MG PO TABS: 10.0000 mg | ORAL_TABLET | Freq: Every evening | ORAL | 3 refills | Status: DC

## 2017-02-22 NOTE — Patient Instructions (Addendum)
We will have your lab results within the next week. Please continue taking medication as prescribed. Follow up in 6 months for reevaluation. I have placed a referral for the eye doctor and they should contact you within the next couple weeks to schedule a diabetic eye exam. Thank you for letting me participate in your health and well being.   IF you received an x-ray today, you will receive an invoice from [facility]. Please contact [facility] at [phone] with questions or concerns regarding your invoice.   IF you received labwork today, you will receive an invoice from [place]. Please contact [facility] at [phone] with questions or concerns regarding your invoice.   Our billing staff will not be able to assist you with questions regarding bills from these companies.  You will be contacted with the lab results as soon as they are available. The fastest way to get your results is to activate your My Chart account. Instructions are located on the last page of this paperwork. If you have not heard from [place] regarding the results in 2 weeks, please contact this office.

## 2017-02-22 NOTE — Progress Notes (Signed)
MRN: [id number]  Subjective:   Jacob Chan is a 61 y.o. male who presents for medication refills for chronic medical conditons of T2DM, HTN, and HLD. Followed by a doctor in [place] but could not get in with them before he was going to run out of medication.   T2DM: Dx in 2008. Patient is currently managed with metformin XR 526m daily. Admits excellent compliance. Denies adverse effects including metallic taste, hypoglycemia, nausea, vomiting. Patient is checking home blood sugars. Home blood sugar records: BGs consistently in an acceptable range. Current symptoms include none. Patient denies foot ulcerations, hyperglycemia, nausea, paresthesia of the feet, polydipsia, polyuria, visual disturbances and vomiting. Patient is checking their feet daily. No foot concerns. Last diabetic eye exam eye exam: never.  Diet: Low sodium, eats more baked, grilled foods than fried foods. Eats various fruits and vegetables. Drinks mostly water. Will drink occasional sweet tea. No sodas. Exercise includes walking daily. He works for the [employer] is constantly moving. Denies smoking or alcohol use.   HTN: Dx in 1998. Currently managed with amlodipine 52mand lisinopril 1046mPatient is checking blood pressure at home, range is 120409-811stolic. . DMarland Kitchennies lightheadedness, dizziness, chronic headache, double vision, chest pain, shortness of breath, heart racing, palpitations, nausea, vomiting, abdominal pain, hematuria, lower leg swelling.   HLD: Currently managed with atorvastatin 24m31m  Objective:   PHYSICAL EXAM BP 131/82 (BP Location: Right Arm, Patient Position: Sitting, Cuff Size: Normal)   Pulse 86   Temp 97.8 F (36.6 C) (Oral)   Resp 18   Ht 5' 10.47" (1.79 m)   Wt 172 lb (78 kg)   SpO2 99%   BMI 24.35 kg/m   Physical Exam  Constitutional: He is oriented to person, place, and time. He appears well-developed and well-nourished.  HENT:  Head: Normocephalic and atraumatic.    Right Ear: Tympanic membrane, external ear and ear canal normal.  Left Ear: Tympanic membrane, external ear and ear canal normal.  Mouth/Throat: Uvula is midline, oropharynx is clear and moist and mucous membranes are normal.  Eyes: Conjunctivae are normal.  Neck: Normal range of motion.  Cardiovascular: Normal rate, regular rhythm, normal heart sounds and intact distal pulses.   Pulmonary/Chest: Effort normal and breath sounds normal. He has no wheezes. He has no rales.  Musculoskeletal:       Right lower leg: He exhibits no swelling.       Left lower leg: He exhibits no swelling.  Neurological: He is alert and oriented to person, place, and time.  Skin: Skin is warm and dry.  Psychiatric: He has a normal mood and affect.  Vitals reviewed.   Diabetic Foot Exam - Simple   Simple Foot Form Visual Inspection No deformities, no ulcerations, no other skin breakdown bilaterally:  Yes Sensation Testing Intact to touch and monofilament testing bilaterally:  Yes Pulse Check Posterior Tibialis and Dorsalis pulse intact bilaterally:  Yes Comments     Results for orders placed or performed in visit on 02/22/17 (from the past 24 hour(s))  POCT urinalysis dipstick     Status: Abnormal   Collection Time: 02/22/17  1:52 PM  Result Value Ref Range   Color, UA straw (A) yellow   Clarity, UA clear clear   Glucose, UA negative negative mg/dL   Bilirubin, UA negative negative   Ketones, POC UA trace (5) (A) negative mg/dL   Spec Grav, UA 1.020 1.010 - 1.025   Blood, UA large (A) negative  pH, UA 5.5 5.0 - 8.0   Protein Ur, POC trace (A) negative mg/dL   Urobilinogen, UA 0.2 0.2 or 1.0 E.U./dL   Nitrite, UA Negative Negative   Leukocytes, UA Negative Negative  POCT glycosylated hemoglobin (Hb A1C)     Status: None   Collection Time: 02/22/17  1:53 PM  Result Value Ref Range   Hemoglobin A1C 6.1     Assessment and Plan :  1. Type 2 diabetes mellitus without complication, without  long-term current use of insulin (HCC) Well controlled. Continue current medication. Plan to follow up with PCP as planned. Return here as needed.  - CMP14+EGFR - Microalbumin/Creatinine Ratio, Urine - POCT urinalysis dipstick - POCT glycosylated hemoglobin (Hb A1C) - HM Diabetes Foot Exam - metFORMIN (GLUMETZA) 500 MG (MOD) 24 hr tablet; Take 1 tablet (500 mg total) by mouth daily with breakfast.  Dispense: 90 tablet; Refill: 1 - Ambulatory referral to Ophthalmology  2. Essential hypertension Controlled.  - CBC with Differential/Platelet - CMP14+EGFR - lisinopril (PRINIVIL,ZESTRIL) 10 MG tablet; Take 1 tablet (10 mg total) by mouth every morning.  Dispense: 90 tablet; Refill: 1 - amLODipine (NORVASC) 5 MG tablet; Take 1 tablet (5 mg total) by mouth every morning.  Dispense: 90 tablet; Refill: 1  3. Hyperlipidemia, unspecified hyperlipidemia type Labs pending. - Lipid panel - atorvastatin (LIPITOR) 10 MG tablet; Take 1 tablet (10 mg total) by mouth every evening.  Dispense: 90 tablet; Refill: [place], PA-C  [facility] 02/22/2017 4:02 PM

## 2017-02-23 LAB — CMP14+EGFR
ALBUMIN: 4.4 g/dL (ref 3.6–4.8)
ALK PHOS: 68 IU/L (ref 39–117)
ALT: 21 IU/L (ref 0–44)
AST: 23 IU/L (ref 0–40)
Albumin/Globulin Ratio: 1.8 (ref 1.2–2.2)
BUN / CREAT RATIO: 16 (ref 10–24)
BUN: 18 mg/dL (ref 8–27)
Bilirubin Total: 0.7 mg/dL (ref 0.0–1.2)
CO2: 23 mmol/L (ref 20–29)
Calcium: 9.6 mg/dL (ref 8.6–10.2)
Chloride: 102 mmol/L (ref 96–106)
Creatinine, Ser: 1.15 mg/dL (ref 0.76–1.27)
GFR, EST AFRICAN AMERICAN: 78 mL/min/{1.73_m2} (ref >59)
GFR, EST NON AFRICAN AMERICAN: 68 mL/min/{1.73_m2} (ref >59)
GLOBULIN, TOTAL: 2.5 g/dL (ref 1.5–4.5)
Glucose: 91 mg/dL (ref 65–99)
Potassium: 4.6 mmol/L (ref 3.5–5.2)
Sodium: 142 mmol/L (ref 134–144)
TOTAL PROTEIN: 6.9 g/dL (ref 6.0–8.5)

## 2017-02-23 LAB — CBC WITH DIFFERENTIAL/PLATELET
Basophils Absolute: 0 10*3/uL (ref 0.0–0.2)
Basos: 1 %
EOS (ABSOLUTE): 0.1 10*3/uL (ref 0.0–0.4)
EOS: 2 %
HEMATOCRIT: 42 % (ref 37.5–51.0)
HEMOGLOBIN: 13.9 g/dL (ref 13.0–17.7)
IMMATURE GRANS (ABS): 0 10*3/uL (ref 0.0–0.1)
Immature Granulocytes: 0 %
LYMPHS ABS: 1.4 10*3/uL (ref 0.7–3.1)
LYMPHS: 26 %
MCH: 31 pg (ref 26.6–33.0)
MCHC: 33.1 g/dL (ref 31.5–35.7)
MCV: 94 fL (ref 79–97)
MONOCYTES: 9 %
Monocytes Absolute: 0.5 10*3/uL (ref 0.1–0.9)
NEUTROS ABS: 3.4 10*3/uL (ref 1.4–7.0)
Neutrophils: 62 %
Platelets: 245 10*3/uL (ref 150–379)
RBC: 4.49 x10E6/uL (ref 4.14–5.80)
RDW: 13.4 % (ref 12.3–15.4)
WBC: 5.5 10*3/uL (ref 3.4–10.8)

## 2017-02-23 LAB — LIPID PANEL
CHOL/HDL RATIO: 2.1 ratio (ref 0.0–5.0)
Cholesterol, Total: 164 mg/dL (ref 100–199)
HDL: 80 mg/dL (ref >39)
LDL CALC: 75 mg/dL (ref 0–99)
Triglycerides: 47 mg/dL (ref 0–149)
VLDL CHOLESTEROL CAL: 9 mg/dL (ref 5–40)

## 2017-02-23 LAB — MICROALBUMIN / CREATININE URINE RATIO
CREATININE, UR: 167 mg/dL
MICROALB/CREAT RATIO: 23.1 mg/g{creat} (ref 0.0–30.0)
Microalbumin, Urine: 38.5 ug/mL

## 2017-03-09 LAB — HM DIABETES EYE EXAM

## 2017-06-22 ENCOUNTER — Encounter: Payer: Self-pay | Admitting: Physician Assistant

## 2017-06-22 ENCOUNTER — Ambulatory Visit (INDEPENDENT_AMBULATORY_CARE_PROVIDER_SITE_OTHER): Admitting: Physician Assistant

## 2017-06-22 ENCOUNTER — Ambulatory Visit (INDEPENDENT_AMBULATORY_CARE_PROVIDER_SITE_OTHER)

## 2017-06-22 VITALS — BP 130/88 | HR 81 | Temp 99.2°F | Resp 18 | Ht 71.02 in | Wt 176.8 lb

## 2017-06-22 DIAGNOSIS — S43102D Unspecified dislocation of left acromioclavicular joint, subsequent encounter: Secondary | ICD-10-CM

## 2017-06-22 DIAGNOSIS — M25512 Pain in left shoulder: Secondary | ICD-10-CM

## 2017-06-22 DIAGNOSIS — G8929 Other chronic pain: Secondary | ICD-10-CM

## 2017-06-22 NOTE — Patient Instructions (Addendum)
I will contact you with your xray results. I still recommend PT. We should try to run it through worker's comp once more. If this does not work, we can try running it through [employer]. Continue work restrictions and icing/heating when you get off work if pain is still present. Thank you for letting me participate in your health and well being.    IF you received an x-ray today, you will receive an invoice from [facility]. Please contact [facility] Radiology at [phone] with questions or concerns regarding your invoice.   IF you received labwork today, you will receive an invoice from [place]. Please contact [facility] at [phone] with questions or concerns regarding your invoice.   Our billing staff will not be able to assist you with questions regarding bills from these companies.  You will be contacted with the lab results as soon as they are available. The fastest way to get your results is to activate your My Chart account. Instructions are located on the last page of this paperwork. If you have not heard from [place] regarding the results in 2 weeks, please contact this office.
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
## 2017-06-24 ENCOUNTER — Telehealth: Payer: Self-pay | Admitting: Physician Assistant
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
## 2017-06-24 NOTE — Telephone Encounter (Signed)
Called patient to discuss x-ray findings.  X-ray shows stable type II AC joint separation.  Considering it has been 6 months, he will need to see orthopedics at this time for further evaluation and treatment.  I have reprinted his work note to indicate that he will be following up with orthopedic surgery at this time.  I have signed and placed at the front desk for him to pick up.

## 2017-07-28 ENCOUNTER — Telehealth: Payer: Self-pay | Admitting: Physician Assistant

## 2017-07-28 NOTE — Telephone Encounter (Signed)
Copied from [place] [id number]. Topic: Quick Communication - Rx Refill/Question >> Jul 28, 2017  4:13 PM Marin Olp L wrote: Has the patient contacted their pharmacy? Changing Pharm (Agent: If no, request that the patient contact the pharmacy for the refill.) Preferred Pharmacy (with phone number or street name): [place], [place]: Please be advised that RX refills may take up to 48 hours. We ask that you follow-up with your pharmacy. Would like to change quantity from 50 to 75tabs.

## 2017-07-31 NOTE — Telephone Encounter (Signed)
Please review for refill.  

## 2017-08-01 NOTE — Telephone Encounter (Signed)
Patient has an appointment on 12-6

## 2017-08-03 ENCOUNTER — Ambulatory Visit (INDEPENDENT_AMBULATORY_CARE_PROVIDER_SITE_OTHER): Admitting: Physician Assistant

## 2017-08-03 ENCOUNTER — Encounter: Payer: Self-pay | Admitting: Physician Assistant

## 2017-08-03 ENCOUNTER — Other Ambulatory Visit: Payer: Self-pay

## 2017-08-03 VITALS — BP 120/80 | HR 92 | Temp 99.3°F | Resp 18 | Ht 71.02 in | Wt 172.2 lb

## 2017-08-03 DIAGNOSIS — S43102D Unspecified dislocation of left acromioclavicular joint, subsequent encounter: Secondary | ICD-10-CM | POA: Diagnosis not present

## 2017-08-03 DIAGNOSIS — G8929 Other chronic pain: Secondary | ICD-10-CM

## 2017-08-03 DIAGNOSIS — M25512 Pain in left shoulder: Secondary | ICD-10-CM

## 2017-08-03 NOTE — Progress Notes (Signed)
I agree with assessment and plan as outlined by Tenna Delaine, PA-C. Norwood Levo, M.D. Primary Care at [facility] previously [facility] [place] [address] [place], [place]  [address] [phone] phone [phone] fax

## 2017-08-03 NOTE — Progress Notes (Signed)
I agree with assessment and plan as outlined by PA Timmothy Euler. Cozy Veale Elayne Guerin, M.D. Primary Care at [facility] previously Ur[facility] [place] [address] [place], [place]  [address] [phone] phone [phone] fax

## 2017-08-03 NOTE — Progress Notes (Signed)
I have reviewed the note in detail and agree with assessment and plan as outlined by PA Timmothy Euler. Cabria Micalizzi Elayne Guerin, M.D. Primary Care at [facility] previously Ur[facility] [place] [address] [place], [place]  [address] [phone] phone [phone] fax

## 2017-08-03 NOTE — Progress Notes (Signed)
I have reviewed assessment and plan as outlined by PA Tenna Delaine and agree as outlined. Bary Limbach Elayne Guerin, M.D. Primary Care at [facility] previously [facility] [place] [address] [place], [place]  [address] [phone] phone [phone] fax

## 2017-08-03 NOTE — Patient Instructions (Signed)
     IF you received an x-ray today, you will receive an invoice from [facility]. Please contact [facility] at [phone] with questions or concerns regarding your invoice.   IF you received labwork today, you will receive an invoice from [facility]. Please contact [facility] at [phone] with questions or concerns regarding your invoice.   Our billing staff will not be able to assist you with questions regarding bills from these companies.  You will be contacted with the lab results as soon as they are available. The fastest way to get your results is to activate your My Chart account. Instructions are located on the last page of this paperwork. If you have not heard from us regarding the results in 2 weeks, please contact this office.     

## 2017-08-03 NOTE — Progress Notes (Signed)
I agree with assessment and plan as outlined by PA Timmothy Euler. Jaleen Finch Elayne Guerin, M.D. Primary Care at [facility] previously Ur[facility] [place] [address] [place], [place]  [address] [phone] phone [phone] fax

## 2017-08-03 NOTE — Progress Notes (Signed)
I agree with assessment and plan as outlined by PA Timmothy Euler. Kristi Elayne Guerin, M.D. Primary Care at [facility] previously Ur[facility] [place] [address] [place], [place]  [address] [phone] phone [phone] fax

## 2017-08-03 NOTE — Progress Notes (Deleted)
   Jacob Chan  MRN: [id number] DOB: 10/20/1955  Subjective:  Jacob Chan is a 61 y.o. male seen in office today for a chief complaint of ***  Review of Systems  Patient Active Problem List   Diagnosis Date Noted  . Hypertension 12/20/2016  . Diabetes mellitus 12/20/2016    Current Outpatient Medications on File Prior to Visit  Medication Sig Dispense Refill  . acetaminophen (TYLENOL) 500 MG tablet Take 1,000 mg by mouth every 6 (six) hours as needed. PAIN    . amLODipine (NORVASC) 5 MG tablet Take 1 tablet (5 mg total) by mouth every morning. 90 tablet 1  . ascorbic acid (VITAMIN C) 500 MG tablet Take 500 mg by mouth daily.    Marland Kitchen aspirin EC 81 MG tablet Take 81 mg by mouth daily.    Marland Kitchen atorvastatin (LIPITOR) 10 MG tablet Take 1 tablet (10 mg total) by mouth every evening. 90 tablet 3  . lisinopril (PRINIVIL,ZESTRIL) 10 MG tablet Take 1 tablet (10 mg total) by mouth every morning. 90 tablet 1  . metFORMIN (GLUMETZA) 500 MG (MOD) 24 hr tablet Take 1 tablet (500 mg total) by mouth daily with breakfast. 90 tablet 1  . Multiple Vitamins-Minerals (CENTRUM SILVER PO) Take 1 tablet by mouth daily.    . sildenafil (REVATIO) 20 MG tablet TAKE TWO TABLETS BY MOUTH APPROXIMATELY 30 TO 60 MINUTES BEFORE INTERCOURSE.  1  . diclofenac sodium (VOLTAREN) 1 % GEL Apply 2 g topically 2 (two) times daily. (Patient not taking: Reported on 08/03/2017) 100 g 0   No current facility-administered medications on file prior to visit.     No Known Allergies   Objective:  BP 120/80 (BP Location: Left Arm, Patient Position: Sitting, Cuff Size: Normal)   Pulse 92   Temp 99.3 F (37.4 C) (Oral)   Resp 18   Ht 5' 11.02" (1.804 m)   Wt 172 lb 3.2 oz (78.1 kg)   SpO2 100%   BMI 24.00 kg/m   Physical Exam  Assessment and Plan :  *** There are no diagnoses linked to this encounter.   Tenna Delaine PA-C  [facility] 08/03/2017 4:47 PM

## 2017-08-03 NOTE — Progress Notes (Signed)
I agree with assessment and plan as outlined by PA Timmothy Euler. Jacob Chan Jacob Chan, M.D. Primary Care at [facility] previously Urgent [place] [address] [place], [place]  [address] [phone] phone [phone] fax

## 2017-08-04 ENCOUNTER — Encounter: Payer: Self-pay | Admitting: Physician Assistant
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
## 2017-08-09 NOTE — Telephone Encounter (Unsigned)
Copied from [place] [id number]. Topic: Inquiry >> Aug 09, 2017  4:24 PM [place] Chancy, NT wrote: Reason for CRM: patient would like for Tenna Delaine to give him a call in regards to not receiving a phone call from the specialist he is supposed to be having surgery on his left shoulder. Please contact patient when you are able to.

## 2017-08-14 NOTE — Telephone Encounter (Signed)
Jacob Chan, could you please help me with this. I have placed the referral to orthopedics on 10/25,but from my understanding, until it is approved from his worker's comp, they will not contact him? Is that correct?

## 2017-09-14 ENCOUNTER — Other Ambulatory Visit: Payer: Self-pay

## 2017-09-14 ENCOUNTER — Encounter: Payer: Self-pay | Admitting: Physician Assistant

## 2017-09-14 ENCOUNTER — Ambulatory Visit (INDEPENDENT_AMBULATORY_CARE_PROVIDER_SITE_OTHER): Admitting: Physician Assistant

## 2017-09-14 VITALS — BP 120/78 | HR 63 | Temp 98.1°F | Resp 18 | Ht 70.87 in | Wt 168.0 lb

## 2017-09-14 DIAGNOSIS — Z5321 Procedure and treatment not carried out due to patient leaving prior to being seen by health care provider: Secondary | ICD-10-CM

## 2017-09-14 NOTE — Progress Notes (Signed)
Pt was not seen in office by a provider.

## 2017-09-14 NOTE — Patient Instructions (Signed)
     IF you received an x-ray today, you will receive an invoice from [facility]. Please contact [facility] at [phone] with questions or concerns regarding your invoice.   IF you received labwork today, you will receive an invoice from [facility]. Please contact [facility] at [phone] with questions or concerns regarding your invoice.   Our billing staff will not be able to assist you with questions regarding bills from these companies.  You will be contacted with the lab results as soon as they are available. The fastest way to get your results is to activate your My Chart account. Instructions are located on the last page of this paperwork. If you have not heard from us regarding the results in 2 weeks, please contact this office.     

## 2017-09-18 ENCOUNTER — Other Ambulatory Visit: Payer: Self-pay | Admitting: Physician Assistant

## 2017-09-18 DIAGNOSIS — I10 Essential (primary) hypertension: Secondary | ICD-10-CM

## 2017-09-18 DIAGNOSIS — E119 Type 2 diabetes mellitus without complications: Secondary | ICD-10-CM

## 2017-09-28 ENCOUNTER — Other Ambulatory Visit: Payer: Self-pay

## 2017-09-28 ENCOUNTER — Ambulatory Visit: Payer: Commercial Managed Care - HMO | Admitting: Physician Assistant

## 2017-09-28 ENCOUNTER — Encounter: Payer: Self-pay | Admitting: Physician Assistant

## 2017-09-28 VITALS — BP 118/72 | HR 78 | Temp 99.2°F | Resp 18 | Ht 71.06 in | Wt 168.6 lb

## 2017-09-28 DIAGNOSIS — B360 Pityriasis versicolor: Secondary | ICD-10-CM

## 2017-09-28 DIAGNOSIS — S43102D Unspecified dislocation of left acromioclavicular joint, subsequent encounter: Secondary | ICD-10-CM

## 2017-09-28 DIAGNOSIS — R21 Rash and other nonspecific skin eruption: Secondary | ICD-10-CM | POA: Diagnosis not present

## 2017-09-28 DIAGNOSIS — N529 Male erectile dysfunction, unspecified: Secondary | ICD-10-CM | POA: Diagnosis not present

## 2017-09-28 LAB — POCT SKIN KOH: SKIN KOH, POC: NEGATIVE

## 2017-09-28 MED ORDER — SILDENAFIL CITRATE 20 MG PO TABS: ORAL_TABLET | ORAL | 2 refills | Status: DC

## 2017-09-28 MED ORDER — DICLOFENAC SODIUM 1 % TD GEL: 2.0000 g | Freq: Two times a day (BID) | TRANSDERMAL | 0 refills | Status: DC

## 2017-09-28 MED ORDER — SELENIUM SULFIDE 2.25 % EX SHAM: 1.0000 "application " | MEDICATED_SHAMPOO | Freq: Every day | CUTANEOUS | 0 refills | Status: AC

## 2017-09-28 NOTE — Progress Notes (Signed)
DARRIEL UTTER  MRN: [id number] DOB: 12-Dec-1955  Subjective:  Jacob Chan is a 62 y.o. male seen in office today for a chief complaint of multipile complaints.  1)ED: Has had dx for over one year now. Takes revatio 40-60mg  for ED 4-5 x a week. Deneis flusing, visiual distrbance, hearing loss, flushing, and chest pain. Has PMH of HTN, very well controlled on norvasc 5mg .  PMH of type 2 diabetes.  Well controlled.  Last A1c 7 months ago 6.1.  No PMH of CAD or stroke. Denies smoking.   2) Shoulder pain: Needs refill for voltaran gel for shoulder pain.  Has history of type II AC joint separation and left shoulder.  This was a workers comp incident and he is still trying to get approval to go to orthopedics.  He uses Voltaren gel as needed for pain.  This helps tremendously along with an ice pack.  3) Rash involving the upper extremity and back . Rash started 3 months ago. Lesions are white, and flat in texture. Rash has changed over time. Rash is pruritic. Associated symptoms: none. Patient denies: abdominal pain, arthralgia, congestion, cough, decrease in appetite, decrease in energy level, fever, headache, irritability, myalgia, nausea, sore throat and vomiting. Patient has not had contacts with similar rash. Patient has not had new exposures (soaps, lotions, laundry detergents, foods, medications, plants, insects or animals).  Rash is worsened when he sweats or gets really hot.  He has tried some over-the-counter topical stuff with no full relief.  Review of Systems  Constitutional: Negative for chills, diaphoresis and fever.  Respiratory: Negative for cough.   Cardiovascular: Negative for chest pain and palpitations.  Gastrointestinal: Negative for nausea and vomiting.  Neurological: Negative for dizziness and light-headedness.    Patient Active Problem List   Diagnosis Date Noted  . Hypertension 12/20/2016  . Diabetes mellitus 12/20/2016    Current Outpatient Medications on  File Prior to Visit  Medication Sig Dispense Refill  . acetaminophen (TYLENOL) 500 MG tablet Take 1,000 mg by mouth every 6 (six) hours as needed. PAIN    . amLODipine (NORVASC) 5 MG tablet TAKE 1 TABLET BY MOUTH EVERY MORNING. 90 tablet 0  . ascorbic acid (VITAMIN C) 500 MG tablet Take 500 mg by mouth daily.    Marland Kitchen aspirin EC 81 MG tablet Take 81 mg by mouth daily.    Marland Kitchen atorvastatin (LIPITOR) 10 MG tablet Take 1 tablet (10 mg total) by mouth every evening. 90 tablet 3  . diclofenac sodium (VOLTAREN) 1 % GEL Apply 2 g topically 2 (two) times daily. 100 g 0  . lisinopril (PRINIVIL,ZESTRIL) 10 MG tablet TAKE 1 TABLET BY MOUTH EVERY MORNING. 90 tablet 0  . metFORMIN (GLUCOPHAGE-XR) 500 MG 24 hr tablet TAKE 1 TABLET WITH BREAKFAST 90 tablet 0  . Multiple Vitamins-Minerals (CENTRUM SILVER PO) Take 1 tablet by mouth daily.    . sildenafil (REVATIO) 20 MG tablet TAKE TWO TABLETS BY MOUTH APPROXIMATELY 30 TO 60 MINUTES BEFORE INTERCOURSE.  1   No current facility-administered medications on file prior to visit.     No Known Allergies    Social History   Socioeconomic History  . Marital status: Married    Spouse name: [name]  . Number of children: 4  . Years of education: Not on file  . Highest education level: Not on file  Social Needs  . Financial resource strain: Not on file  . Food insecurity - worry: Not on file  . Food  insecurity - inability: Not on file  . Transportation needs - medical: Not on file  . Transportation needs - non-medical: Not on file  Occupational History  . Not on file  Tobacco Use  . Smoking status: Never Smoker  . Smokeless tobacco: Never Used  Substance and Sexual Activity  . Alcohol use: No  . Drug use: No  . Sexual activity: Yes  Other Topics Concern  . Not on file  Social History Narrative  . Not on file    Objective:  BP 118/72 (BP Location: Left Arm, Patient Position: Sitting, Cuff Size: Normal)   Pulse 78   Temp 99.2 F (37.3 C) (Oral)   Resp 18    Ht 5' 11.06" (1.805 m)   Wt 168 lb 9.6 oz (76.5 kg)   SpO2 99%   BMI 23.47 kg/m   Physical Exam  Constitutional: He is oriented to person, place, and time and well-developed, well-nourished, and in no distress.  HENT:  Head: Normocephalic and atraumatic.  Eyes: Conjunctivae are normal.  Neck: Normal range of motion.  Cardiovascular: Normal rate, regular rhythm and normal heart sounds.  Pulmonary/Chest: Effort normal and breath sounds normal.  Neurological: He is alert and oriented to person, place, and time. Gait normal.  Skin: Skin is warm and dry. Rash ( Multiple hypopigmented flaky macules coalescing into patches on bilateral elbow flexure creases and oval flaky hypopigmented macules on posterior trunk.  ) noted.  Psychiatric: Affect normal.  Vitals reviewed.  Results for orders placed or performed in visit on 09/28/17 (from the past 24 hour(s))  POCT Skin KOH     Status: None   Collection Time: 09/28/17 10:09 AM  Result Value Ref Range   Skin KOH, POC Negative Negative    Assessment and Plan :  1. AC separation, left, subsequent encounter - diclofenac sodium (VOLTAREN) 1 % GEL; Apply 2 g topically 2 (two) times daily.  Dispense: 100 g; Refill: 0  2. Erectile dysfunction, unspecified erectile dysfunction type Rx given - use lowest effective dose. Side effects discussed (including but not limited to headache/flushing, blue discoloration of vision, possible vascular steal and risk of cardiac effects if underlying unknown coronary artery disease, and permanent sensorineural hearing loss). Understanding expressed. - sildenafil (REVATIO) 20 MG tablet; TAKE TWO TABLETS BY MOUTH APPROXIMATELY 30 TO 60 MINUTES BEFORE INTERCOURSE.  Dispense: 75 tablet; Refill: 2  3. Rash and nonspecific skin eruption - POCT Skin KOH  4. Tinea versicolor History and physical exam findings consistent with tinea versicolor.Point-of-care KOH negative.  Ddx includes pityriasis.  Rx for selenium sulfide  given to pt.  Educated on dosing.  Advised to return to clinic if symptoms worsen, do not improve, or as needed. - Selenium Sulfide 2.25 % SHAM; Apply 1 application topically daily for 7 days. Apply to affected area and lather with small amounts of water; leave on skin for 10 minutes, then rinse thoroughly.  Dispense: 180 mL; Refill: 0  Tenna Delaine PA-C  Primary Care at [facility] 09/28/2017 10:13 AM

## 2017-09-28 NOTE — Patient Instructions (Addendum)
For revatio, use lowest effective dose. Side effects include but not limited to headache/flushing, blue discoloration of vision, possible vascular steal and risk of cardiac effects if underlying unknown coronary artery disease, and permanent sensorineural hearing loss.   For rash, apply topical shampoo in the shower daily for the next 7 days.  If no improvement, or symptoms worsen, please return office for reevaluation.   Tinea Versicolor Tinea versicolor is a skin infection that is caused by a type of yeast. It causes a rash that shows up as light or dark patches on the skin. It often occurs on the chest, back, neck, or upper arms. The condition usually does not cause other problems. In most cases, it goes away in a few weeks with treatment. The infection cannot be spread by person to another person. Follow these instructions at home:  Take medicines only as told by your doctor.  Scrub your skin every day with a dandruff shampoo as told by your doctor.  Do not scratch your skin in the rash area.  Avoid places that are hot and humid.  Do not use tanning booths.  Try to avoid sweating a lot. Contact a doctor if:  Your symptoms get worse.  You have a fever.  You have redness, swelling, or pain in the area of your rash.  You have fluid, blood, or pus coming from your rash.  Your rash comes back after treatment. This information is not intended to replace advice given to you by your health care provider. Make sure you discuss any questions you have with your health care provider. Document Released: 07/28/2008 Document Revised: 04/17/2016 Document Reviewed: 05/27/2014 Elsevier Interactive Patient Education  2018 [employer].    IF you received an x-ray today, you will receive an invoice from [facility]. Please contact [facility] at [phone] with questions or concerns regarding your invoice.   IF you received labwork today, you will receive an invoice from  [facility]. Please contact [facility] at [phone] with questions or concerns regarding your invoice.   Our billing staff will not be able to assist you with questions regarding bills from these companies.  You will be contacted with the lab results as soon as they are available. The fastest way to get your results is to activate your My Chart account. Instructions are located on the last page of this paperwork. If you have not heard from [place] regarding the results in 2 weeks, please contact this office.

## 2017-12-07 ENCOUNTER — Telehealth: Payer: Self-pay

## 2017-12-07 NOTE — Telephone Encounter (Signed)
Copied from [place] [id number]. Topic: Quick Communication - See Telephone Encounter >> Dec 06, 2017  9:47 AM Antonieta Iba C wrote: CRM for notification. See Telephone encounter for: 12/06/17.  Pt called in to follow up on his workers comp paperwork. Pt would like to speak with Jenny Reichmann [facility]) directly if possible.  -- [phone]   >> Dec 07, 2017  8:31 AM Virl Axe P wrote: This is not any paperwork for me--pt has not been seen here since Jan. And there is nothing on file for FMLA/Disability--pt is requesting to speak to John. >> Dec 07, 2017  9:42 AM Rodolph Bong, RN wrote: Pt states that he wanted to speak with Eddie Dibbles regarding this paperwork not Jenny Reichmann.

## 2017-12-21 ENCOUNTER — Other Ambulatory Visit: Payer: Self-pay | Admitting: Physician Assistant

## 2017-12-21 DIAGNOSIS — E119 Type 2 diabetes mellitus without complications: Secondary | ICD-10-CM

## 2017-12-21 DIAGNOSIS — I10 Essential (primary) hypertension: Secondary | ICD-10-CM

## 2017-12-22 NOTE — Telephone Encounter (Signed)
Metformin refill Last OV: 02/22/17 Last Refill:09/18/17 no RF [place] PA  Last Hgb A1C 6.1 on 02/22/17  Lisinipril refill Last OV: 02/22/17 Last Refill:09/18/17 #90 no RF   Amlodipine refill Last OV: 02/22/17 Last Refill:09/18/17 #90 No RF

## 2018-01-18 ENCOUNTER — Other Ambulatory Visit: Payer: Self-pay

## 2018-01-18 ENCOUNTER — Encounter: Payer: Self-pay | Admitting: Physician Assistant

## 2018-01-18 ENCOUNTER — Ambulatory Visit: Payer: 59 | Admitting: Physician Assistant

## 2018-01-18 VITALS — BP 110/66 | HR 92 | Temp 98.9°F | Resp 18 | Ht 70.87 in | Wt 165.8 lb

## 2018-01-18 DIAGNOSIS — E785 Hyperlipidemia, unspecified: Secondary | ICD-10-CM

## 2018-01-18 DIAGNOSIS — Z5320 Procedure and treatment not carried out because of patient's decision for unspecified reasons: Secondary | ICD-10-CM | POA: Diagnosis not present

## 2018-01-18 DIAGNOSIS — E119 Type 2 diabetes mellitus without complications: Secondary | ICD-10-CM

## 2018-01-18 DIAGNOSIS — Z1159 Encounter for screening for other viral diseases: Secondary | ICD-10-CM | POA: Diagnosis not present

## 2018-01-18 DIAGNOSIS — I10 Essential (primary) hypertension: Secondary | ICD-10-CM

## 2018-01-18 DIAGNOSIS — Z1211 Encounter for screening for malignant neoplasm of colon: Secondary | ICD-10-CM

## 2018-01-18 DIAGNOSIS — N529 Male erectile dysfunction, unspecified: Secondary | ICD-10-CM

## 2018-01-18 MED ORDER — ROSUVASTATIN CALCIUM 5 MG PO TABS: 5.0000 mg | ORAL_TABLET | Freq: Every day | ORAL | 0 refills | Status: DC

## 2018-01-18 MED ORDER — AMLODIPINE BESYLATE 5 MG PO TABS
5.0000 mg | ORAL_TABLET | Freq: Every morning | ORAL | 1 refills | Status: DC
Start: 2018-01-18 — End: 2018-08-11

## 2018-01-18 MED ORDER — LISINOPRIL 10 MG PO TABS: 10.0000 mg | ORAL_TABLET | Freq: Every morning | ORAL | 1 refills | Status: DC

## 2018-01-18 MED ORDER — METFORMIN HCL ER 500 MG PO TB24: ORAL_TABLET | ORAL | 1 refills | Status: DC

## 2018-01-18 MED ORDER — SILDENAFIL CITRATE 20 MG PO TABS: ORAL_TABLET | ORAL | 2 refills | Status: DC

## 2018-01-18 NOTE — Patient Instructions (Addendum)
We will change you from atorvastatin to crestor to see if we can get the night sweats to stop.  See [place] 3 months - we will check your cholesterol at that time.    IF you received an x-ray today, you will receive an invoice from [facility]. Please contact [facility] at [phone] with questions or concerns regarding your invoice.   IF you received labwork today, you will receive an invoice from [place]. Please contact [facility] at [phone] with questions or concerns regarding your invoice.   Our billing staff will not be able to assist you with questions regarding bills from these companies.  You will be contacted with the lab results as soon as they are available. The fastest way to get your results is to activate your My Chart account. Instructions are located on the last page of this paperwork. If you have not heard from [place] regarding the results in 2 weeks, please contact this office.

## 2018-01-18 NOTE — Progress Notes (Signed)
Jacob Chan  MRN: [id number] DOB: May 15, 1956  PCP: Leonie Douglas, PA-C  Chief Complaint  Patient presents with  . Hypertension    f/u  . Medication Refill    all    Subjective:  Pt presents to clinic for medication refill.  He has been doing good on his medications.  He takes his lipitor at night and he feels like since it went generic he has been having hot flashes - he has not stopped the medication to see if they will go away but he wants to know if something can be done for that.  Home BP 111/68 - lower last night - he did not feel bad He does not check his glucose at home.  History is obtained by patient.  Review of Systems  Constitutional: Negative for chills and fever.  Eyes: Negative for visual disturbance.  Respiratory: Negative for cough and shortness of breath.   Cardiovascular: Negative for chest pain, palpitations and leg swelling.  Neurological: Negative for dizziness, light-headedness, numbness and headaches.    Patient Active Problem List   Diagnosis Date Noted  . Hypertension 12/20/2016  . Diabetes mellitus 12/20/2016    Current Outpatient Medications on File Prior to Visit  Medication Sig Dispense Refill  . acetaminophen (TYLENOL) 500 MG tablet Take 1,000 mg by mouth every 6 (six) hours as needed. PAIN    . ascorbic acid (VITAMIN C) 500 MG tablet Take 500 mg by mouth daily.    Marland Kitchen aspirin EC 81 MG tablet Take 81 mg by mouth daily.    . diclofenac sodium (VOLTAREN) 1 % GEL Apply 2 g topically 2 (two) times daily. 100 g 0  . Multiple Vitamins-Minerals (CENTRUM SILVER PO) Take 1 tablet by mouth daily.     No current facility-administered medications on file prior to visit.     No Known Allergies  Past Medical History:  Diagnosis Date  . Chronic kidney disease   . Diabetes mellitus   . Hypertension    Social History   Social History Narrative  . Not on file   Social History   Tobacco Use  . Smoking status: Never Smoker  .  Smokeless tobacco: Never Used  Substance Use Topics  . Alcohol use: No  . Drug use: No   family history includes Diabetes in his father and mother; Heart disease in his mother.     Objective:  BP 110/66 (BP Location: Left Arm, Patient Position: Sitting, Cuff Size: Normal)   Pulse 92   Temp 98.9 F (37.2 C) (Oral)   Resp 18   Ht 5' 10.87" (1.8 m)   Wt 165 lb 12.8 oz (75.2 kg)   SpO2 99%   BMI 23.21 kg/m  Body mass index is 23.21 kg/m.  Physical Exam  Constitutional: He is oriented to person, place, and time. He appears well-developed and well-nourished.  HENT:  Head: Normocephalic and atraumatic.  Right Ear: External ear normal.  Left Ear: External ear normal.  Eyes: Conjunctivae are normal.  Neck: Normal range of motion.  Cardiovascular: Normal rate, regular rhythm, normal heart sounds and intact distal pulses.  Pulmonary/Chest: Effort normal and breath sounds normal. He has no wheezes.  Musculoskeletal:       Right lower leg: He exhibits no edema.       Left lower leg: He exhibits no edema.  Neurological: He is alert and oriented to person, place, and time.  Skin: Skin is warm and dry.  Psychiatric: He has a normal  mood and affect. His behavior is normal. Judgment and thought content normal.  Vitals reviewed.   Assessment and Plan :  Type 2 diabetes mellitus without complication, without long-term current use of insulin (HCC) - Plan: Hemoglobin A1c, CMP14+EGFR, metFORMIN (GLUCOPHAGE-XR) 500 MG 24 hr tablet - check labs - adjust medication if needed  Essential hypertension - Plan: CMP14+EGFR, lisinopril (PRINIVIL,ZESTRIL) 10 MG tablet, amLODipine (NORVASC) 5 MG tablet - encouraged pt to split to dose to one in the am and one in the Pm for better 24h control  Erectile dysfunction, unspecified erectile dysfunction type - Plan: sildenafil (REVATIO) 20 MG tablet - refilled pt medication  Hyperlipidemia, unspecified hyperlipidemia type - Plan: rosuvastatin (CRESTOR) 5 MG  tablet, CMP14+EGFR - change to crestor to see if hot flashes at night resolve - I am not sure it is being caused by the lipitor but pt would like to make this change to see  HIV screening declined - Plan: HIV antibody - check labs  Need for hepatitis C screening test - Plan: HCV Ab w Reflex to Quant PCR - check labs  Screen for colon cancer - Plan: Cologuard - pt has had a colonoscopy over 10 years ago which was normal - he is having no blood in stool he would like to try this screening method  Windell Hummingbird PA-C  Primary Care at [place] 01/19/2018 8:49 AM

## 2018-01-19 ENCOUNTER — Encounter: Payer: Self-pay | Admitting: Physician Assistant

## 2018-01-19 LAB — CMP14+EGFR
ALBUMIN: 4 g/dL (ref 3.6–4.8)
ALK PHOS: 63 IU/L (ref 39–117)
ALT: 18 IU/L (ref 0–44)
AST: 18 IU/L (ref 0–40)
Albumin/Globulin Ratio: 1.7 (ref 1.2–2.2)
BUN / CREAT RATIO: 14 (ref 10–24)
BUN: 19 mg/dL (ref 8–27)
Bilirubin Total: 0.4 mg/dL (ref 0.0–1.2)
CO2: 22 mmol/L (ref 20–29)
CREATININE: 1.33 mg/dL — AB (ref 0.76–1.27)
Calcium: 9.3 mg/dL (ref 8.6–10.2)
Chloride: 103 mmol/L (ref 96–106)
GFR calc Af Amer: 65 mL/min/{1.73_m2} (ref >59)
GFR, EST NON AFRICAN AMERICAN: 56 mL/min/{1.73_m2} — AB (ref >59)
GLOBULIN, TOTAL: 2.4 g/dL (ref 1.5–4.5)
Glucose: 142 mg/dL — ABNORMAL HIGH (ref 65–99)
Potassium: 4.3 mmol/L (ref 3.5–5.2)
SODIUM: 139 mmol/L (ref 134–144)
Total Protein: 6.4 g/dL (ref 6.0–8.5)

## 2018-01-19 LAB — HCV AB W REFLEX TO QUANT PCR: HCV Ab: <0.1 s/co ratio (ref 0.0–0.9)

## 2018-01-19 LAB — HEMOGLOBIN A1C
Est. average glucose Bld gHb Est-mCnc: 134 mg/dL
Hgb A1c MFr Bld: 6.3 % — ABNORMAL HIGH (ref 4.8–5.6)

## 2018-01-19 LAB — HIV ANTIBODY (ROUTINE TESTING W REFLEX): HIV SCREEN 4TH GENERATION: NONREACTIVE

## 2018-01-19 LAB — HCV INTERPRETATION

## 2018-01-24 ENCOUNTER — Encounter: Payer: Self-pay | Admitting: *Deleted

## 2018-02-27 ENCOUNTER — Other Ambulatory Visit: Payer: Self-pay | Admitting: Physician Assistant

## 2018-02-27 DIAGNOSIS — E785 Hyperlipidemia, unspecified: Secondary | ICD-10-CM

## 2018-03-08 ENCOUNTER — Telehealth: Payer: Self-pay | Admitting: Physician Assistant

## 2018-03-08 NOTE — Telephone Encounter (Signed)
Called pt to try and reschedule his appt with Timmothy Euler on 04/26/18. Pt was not in front of his calender and will call back to make an appt. When he calls back, please reschedule him with Timmothy Euler at his convenience for an [place] for f/u 01/18/18.   Thanks!

## 2018-03-09 ENCOUNTER — Encounter: Payer: Self-pay | Admitting: Physician Assistant

## 2018-03-09 LAB — HM DIABETES EYE EXAM

## 2018-03-22 NOTE — Telephone Encounter (Signed)
° ° °  Pt has paperwork he need to have filled out and will drop paperwork off on 03/23/18

## 2018-03-24 ENCOUNTER — Telehealth: Payer: Self-pay | Admitting: Physician Assistant

## 2018-03-24 NOTE — Telephone Encounter (Signed)
Pt dropped off [employer] Pre-employment Medical and Restriction Information form. Pt did not want a copy, but stated he could get copy printed off at home. He wanted to know if this would be available for him to pick up at his appt on 03/27/18 with [place]. Pt wanted to know if this could not be completed before then. I am placing this form in [place] box in 104. Thanks!

## 2018-03-27 ENCOUNTER — Encounter: Payer: Self-pay | Admitting: Physician Assistant

## 2018-03-27 ENCOUNTER — Ambulatory Visit: Payer: 59 | Admitting: Physician Assistant

## 2018-03-27 ENCOUNTER — Other Ambulatory Visit: Payer: Self-pay

## 2018-03-27 VITALS — BP 121/73 | HR 86 | Temp 98.7°F | Resp 20 | Ht 70.55 in | Wt 173.0 lb

## 2018-03-27 DIAGNOSIS — G8929 Other chronic pain: Secondary | ICD-10-CM

## 2018-03-27 DIAGNOSIS — S43102D Unspecified dislocation of left acromioclavicular joint, subsequent encounter: Secondary | ICD-10-CM | POA: Diagnosis not present

## 2018-03-27 DIAGNOSIS — M25512 Pain in left shoulder: Secondary | ICD-10-CM | POA: Diagnosis not present

## 2018-03-27 NOTE — Patient Instructions (Addendum)
  If you are still having pain after 4 weeks in the new role, please contact our office so we can run your personal insurance and get you in with orthopedics for further evaluation.   Thank you for letting me participate in your health and well being.    IF you received an x-ray today, you will receive an invoice from [facility]. Please contact [facility] at [phone] with questions or concerns regarding your invoice.   IF you received labwork today, you will receive an invoice from [place]. Please contact [facility] at [phone] with questions or concerns regarding your invoice.   Our billing staff will not be able to assist you with questions regarding bills from these companies.  You will be contacted with the lab results as soon as they are available. The fastest way to get your results is to activate your My Chart account. Instructions are located on the last page of this paperwork. If you have not heard from [place] regarding the results in 2 weeks, please contact this office.

## 2018-03-27 NOTE — Progress Notes (Signed)
Jacob Chan  MRN: [id number] DOB: 1956-08-18  Subjective:  Jacob Chan is a 62 y.o. male seen in office today for a chief complaint of need of paperwork for type II [facility] joint separation, which occurred in 11/2016. He was referred to PT, but workers comp did not approve. He continued to do his job and have pain.  Last plain films in 05/2017 showed stable type II left AC joint. Referred to orthopedics at that time, but once again, it was not approved by worker's comp so he never went. Rec he utilize his own insurance and get evaluated by orthopedics but he declined. 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 He only notices the left shoulder pain when he does continuous repetitive motions with left shoulder for >1 hour at a time of if he lifts things over 30-40lbs. Does not believe he will be doing this in his new job. Will typically take tylenol if he has pain and this will help. Has continued doing shoulder ROM exercises as needed. Denies decreased ROM, numbness, tingling, redness, and warmth. No other questions or concerns today.   Review of Systems  Per HPI  Patient Active Problem List   Diagnosis Date Noted  . Hypertension 12/20/2016  . Diabetes mellitus 12/20/2016    Current Outpatient Medications on File Prior to Visit  Medication Sig Dispense Refill  . acetaminophen (TYLENOL) 500 MG tablet Take 1,000 mg by mouth every 6 (six) hours as needed. PAIN    . amLODipine (NORVASC) 5 MG tablet Take 1 tablet (5 mg total) by mouth every morning. 90 tablet 1  . ascorbic acid (VITAMIN C) 500 MG tablet Take 500 mg by mouth daily.    Marland Kitchen aspirin EC 81 MG tablet Take 81 mg by mouth daily.    . diclofenac sodium (VOLTAREN) 1 % GEL Apply 2 g topically 2 (two) times daily. 100 g 0  . lisinopril (PRINIVIL,ZESTRIL) 10 MG tablet Take 1 tablet (10 mg total) by mouth every morning. 90 tablet 1  . metFORMIN (GLUCOPHAGE-XR) 500 MG 24 hr tablet TAKE 1 TABLET WITH BREAKFAST 90 tablet  1  . Multiple Vitamins-Minerals (CENTRUM SILVER PO) Take 1 tablet by mouth daily.    . rosuvastatin (CRESTOR) 5 MG tablet TAKE 1 TABLET ONCE DAILY. 30 tablet 0  . sildenafil (REVATIO) 20 MG tablet TAKE TWO TABLETS BY MOUTH APPROXIMATELY 30 TO 60 MINUTES BEFORE INTERCOURSE. 75 tablet 2   No current facility-administered medications on file prior to visit.     No Known Allergies   Objective:  BP 121/73 (BP Location: Left Arm, Patient Position: Sitting, Cuff Size: Normal)   Pulse 86   Temp 98.7 F (37.1 C) (Oral)   Resp 20   Ht 5' 10.55" (1.792 m)   Wt 173 lb (78.5 kg)   SpO2 100%   BMI 24.44 kg/m   Physical Exam  Constitutional: He is oriented to person, place, and time. He appears well-developed and well-nourished. No distress.  HENT:  Head: Normocephalic and atraumatic.  Eyes: Conjunctivae are normal.  Neck: Normal range of motion.  Pulmonary/Chest: Effort normal.  Musculoskeletal:       Left shoulder: He exhibits tenderness (mild tenderness noted with palpation at [facility] joint). He exhibits normal range of motion, no bony tenderness and no swelling.  Left shoulder exam: Negative Cross Arm Test.   Neurological: He is alert and oriented to person, place, and time.  Skin: Skin is warm and dry.  Psychiatric: He has a  normal mood and affect.  Vitals reviewed.   Assessment and Plan :  1. AC separation, left, subsequent encounter 2. Chronic left shoulder pain Paperwork completed, scanned, and given back to patient in office. Pt still experiences pain at [facility] joint intermittently with repepitive movements of shoulder > 1hr at a time and lifting weight >40lbs. Has mild TTP at [facility] joint noted on exam, but otherwise no abnormalities noted on exam. Full ROM noted. Due to duration of pain, rec he follow up with orthopedics despite it being denied by workers comp for further evaluation of AC degeneration or other shoulder pathology. He would like to try out his new job position before  considering this. Agrees to f/u in 4 weeks if pain persists. Will refer to orthopedics under his own insurance at that time.   A total of 15 minutes was spent in the room with the patient, greater than 50% of which was in counseling/coordination of care regarding of chronic left shoulder pain.  Tenna Delaine PA-C  Primary Care at [facility] 03/27/2018 2:11 PM

## 2018-04-03 ENCOUNTER — Other Ambulatory Visit: Payer: Self-pay | Admitting: Physician Assistant

## 2018-04-03 DIAGNOSIS — E785 Hyperlipidemia, unspecified: Secondary | ICD-10-CM

## 2018-04-26 ENCOUNTER — Ambulatory Visit: Payer: Self-pay | Admitting: Physician Assistant

## 2018-05-07 ENCOUNTER — Other Ambulatory Visit: Payer: Self-pay | Admitting: Physician Assistant

## 2018-05-07 DIAGNOSIS — I10 Essential (primary) hypertension: Secondary | ICD-10-CM

## 2018-08-11 ENCOUNTER — Ambulatory Visit: Payer: 59 | Admitting: Family Medicine

## 2018-08-11 ENCOUNTER — Other Ambulatory Visit: Payer: Self-pay

## 2018-08-11 ENCOUNTER — Encounter: Payer: Self-pay | Admitting: Family Medicine

## 2018-08-11 VITALS — BP 133/83 | HR 88 | Temp 99.0°F | Resp 18 | Ht 70.55 in | Wt 187.6 lb

## 2018-08-11 DIAGNOSIS — E785 Hyperlipidemia, unspecified: Secondary | ICD-10-CM | POA: Diagnosis not present

## 2018-08-11 DIAGNOSIS — J029 Acute pharyngitis, unspecified: Secondary | ICD-10-CM

## 2018-08-11 DIAGNOSIS — E119 Type 2 diabetes mellitus without complications: Secondary | ICD-10-CM

## 2018-08-11 DIAGNOSIS — I10 Essential (primary) hypertension: Secondary | ICD-10-CM | POA: Diagnosis not present

## 2018-08-11 LAB — POCT RAPID STREP A (OFFICE): Rapid Strep A Screen: NEGATIVE

## 2018-08-11 MED ORDER — AMOXICILLIN 875 MG PO TABS: 875.0000 mg | ORAL_TABLET | Freq: Two times a day (BID) | ORAL | 0 refills | Status: DC

## 2018-08-11 MED ORDER — METFORMIN HCL ER 500 MG PO TB24: ORAL_TABLET | ORAL | 1 refills | Status: DC

## 2018-08-11 MED ORDER — AMLODIPINE BESYLATE 5 MG PO TABS: 5.0000 mg | ORAL_TABLET | Freq: Every morning | ORAL | 0 refills | Status: DC

## 2018-08-11 MED ORDER — ROSUVASTATIN CALCIUM 5 MG PO TABS: 5.0000 mg | ORAL_TABLET | Freq: Every day | ORAL | 0 refills | Status: DC

## 2018-08-11 MED ORDER — LISINOPRIL 10 MG PO TABS: 10.0000 mg | ORAL_TABLET | Freq: Every morning | ORAL | 0 refills | Status: DC

## 2018-08-11 NOTE — Progress Notes (Signed)
Patient ID: Jacob Chan, male    DOB: 1956-05-11, 62 y.o.   MRN: [id number]  PCP: Leonie Douglas, PA-C  Chief Complaint  Patient presents with  . Sore Throat    feels like mucus is stuck in throat x2-3days   . Medication Refill    metformin,lisinopril,amlodipine,and atorvastatin    Subjective:  HPI Jacob Chan is a 62 y.o. male presents for evaluation of worsening sore throat with possible exposure to strep. Adult son diagnosed with strep today. He is concern as throat has grown increasingly sore over the course of 3 days. He has experienced a mild cough which is occasionally productive  of clear mucus.  He needs refills of all chronic medication. Patient's prior PCP is no longer at practice and he has not re-established with a provider. He denies chest pain, shortness of breath, new weakness, or dizziness.  Social History   Socioeconomic History  . Marital status: Married    Spouse name: [name]  . Number of children: 4  . Years of education: Not on file  . Highest education level: Not on file  Occupational History  . Not on file  Social Needs  . Financial resource strain: Not on file  . Food insecurity:    Worry: Not on file    Inability: Not on file  . Transportation needs:    Medical: Not on file    Non-medical: Not on file  Tobacco Use  . Smoking status: Never Smoker  . Smokeless tobacco: Never Used  Substance and Sexual Activity  . Alcohol use: No  . Drug use: No  . Sexual activity: Yes  Lifestyle  . Physical activity:    Days per week: Not on file    Minutes per session: Not on file  . Stress: Not on file  Relationships  . Social connections:    Talks on phone: Not on file    Gets together: Not on file    Attends religious service: Not on file    Active member of club or organization: Not on file    Attends meetings of clubs or organizations: Not on file    Relationship status: Not on file  . Intimate partner violence:    Fear of current  or ex partner: Not on file    Emotionally abused: Not on file    Physically abused: Not on file    Forced sexual activity: Not on file  Other Topics Concern  . Not on file  Social History Narrative  . Not on file    Family History  Problem Relation Age of Onset  . Diabetes Mother   . Heart disease Mother   . Diabetes Father    Review of Systems Pertinent negatives listed in HPI  Patient Active Problem List   Diagnosis Date Noted  . Hypertension 12/20/2016  . Diabetes mellitus 12/20/2016    No Known Allergies  Prior to Admission medications   Medication Sig Start Date End Date Taking? Authorizing Provider  acetaminophen (TYLENOL) 500 MG tablet Take 1,000 mg by mouth every 6 (six) hours as needed. PAIN   Yes [provider]  amLODipine (NORVASC) 5 MG tablet Take 1 tablet (5 mg total) by mouth every morning. 01/18/18  Yes Weber, Sarah L, PA-C  ascorbic acid (VITAMIN C) 500 MG tablet Take 500 mg by mouth daily.   Yes [provider]  aspirin EC 81 MG tablet Take 81 mg by mouth daily.   Yes [provider]  atorvastatin (LIPITOR) 10 MG tablet Take 10 mg by mouth daily.   Yes [provider]  diclofenac sodium (VOLTAREN) 1 % GEL Apply 2 g topically 2 (two) times daily. 09/28/17  Yes Timmothy Euler, [place] D, PA-C  lisinopril (PRINIVIL,ZESTRIL) 10 MG tablet Take 1 tablet (10 mg total) by mouth every morning. 01/18/18  Yes Weber, Sarah L, PA-C  metFORMIN (GLUCOPHAGE-XR) 500 MG 24 hr tablet TAKE 1 TABLET WITH BREAKFAST 01/18/18  Yes Weber, Sarah L, PA-C  Multiple Vitamins-Minerals (CENTRUM SILVER PO) Take 1 tablet by mouth daily.   Yes [provider]  rosuvastatin (CRESTOR) 5 MG tablet TAKE 1 TABLET ONCE DAILY. 02/27/18  Yes Timmothy Euler, [place] D, PA-C  sildenafil (REVATIO) 20 MG tablet TAKE TWO TABLETS BY MOUTH APPROXIMATELY 30 TO 60 MINUTES BEFORE INTERCOURSE. 01/18/18  Yes Weber, Damaris Hippo, PA-C    Past Medical, Surgical Family and Social History  reviewed and updated.    Objective:   Today's Vitals   08/11/18 1128  BP: 133/83  Pulse: 88  Resp: 18  Temp: 99 F (37.2 C)  TempSrc: Oral  SpO2: 95%  Weight: 187 lb 9.6 oz (85.1 kg)  Height: 5' 10.55" (1.792 m)    Wt Readings from Last 3 Encounters:  08/11/18 187 lb 9.6 oz (85.1 kg)  03/27/18 173 lb (78.5 kg)  01/18/18 165 lb 12.8 oz (75.2 kg)     Physical Exam General appearance: alert, well developed, well nourished, cooperative and in no distress Head: Normocephalic, without obvious abnormality, atraumatic Pharynx erythematous and edematousu. np wjote pard  Respiratory: Respirations even and unlabored, normal respiratory rate Heart: rate and rhythm normal. No gallop or murmurs noted on exam  Extremities: No gross deformities Skin: Skin color, texture, turgor normal. No rashes seen  Psych: Appropriate mood and affect. Neurologic: Mental status: Alert, oriented to person, place, and time, thought content appropriate.  No results found for: POCGLU  Lab Results  Component Value Date   HGBA1C 6.3 (H) 01/18/2018    Assessment & Plan:  1. Sore throat Appearance of tonsils and pharynx  is consistent with a possible streptococcal infection. Treating empirically with amoxicillin 500 mg BIDx 10 days - POCT rapid strep A, negative - Culture, Group A Strep, pending   2. Type 2 diabetes mellitus without complication, without long-term current use of insulin ([place]) last A1C 6.29 Dec 2017. A1C  - metFORMIN (GLUCOPHAG, last A1C E-XR) 500 MG 24 hr tablet; TAKE 1 TABLET WITH BREAKFAST  Dispense: 90 needA   A1C and adjust meicatink st; Refill: 1  3. Essential hypertension, STABLE - lisinopril (PRINIVIL,ZESTRIL) 10 MG tablet; Take 1 tablet (10 mg total) by mouth every morning.  Dispense: 30 tablet; Refill: 0 - amLODipine (NORVASC) 5 MG tablet; Take 1 tablet (5 mg total) by mouth every morning.  Dispense: 30 tablet; Refill: 0  4. Hyperlipidemia, unspecified hyperlipidemia  type *ESTOR) 5 MG tablet; Take 1 tablet (5 mg total) by mouth daily.  Dispense: 60 tablet; Refill: 0  Meds ordered this encounter  Medications  . metFORMIN (GLUCOPHAGE-XR) 500 MG 24 hr tablet    Sig: TAKE 1 TABLET WITH BREAKFAST    Dispense:  90 tablet    Refill:  1    Patient needs office visit for more refills  . lisinopril (PRINIVIL,ZESTRIL) 10 MG tablet    Sig: Take 1 tablet (10 mg total) by mouth every morning.    Dispense:  30 tablet    Refill:  0    Patient needs OV for more  refills.  . rosuvastatin (CRESTOR) 5 MG tablet    Sig: Take 1 tablet (5 mg total) by mouth daily.    Dispense:  60 tablet    Refill:  0  . amLODipine (NORVASC) 5 MG tablet    Sig: Take 1 tablet (5 mg total) by mouth every morning.    Dispense:  30 tablet    Refill:  0    Patient needs office visit for more refills  . amoxicillin (AMOXIL) 875 MG tablet    Sig: Take 1 tablet (875 mg total) by mouth 2 (two) times daily.    Dispense:  20 tablet    Refill:  0      -The patient was given clear instructions to go to ER or return to medical center if symptoms do not improve, worsen or new problems develop. The patient verbalized understanding.     Carroll Sage. Kenton Kingfisher, FNP-C Nurse Practitioner (PRN Staff)  Primary [facility] at [facility] [address] [place], [place]

## 2018-08-11 NOTE — Patient Instructions (Addendum)
If you have lab work done today you will be contacted with your lab results within the next 2 weeks.  If you have not heard from [place] then please contact us. The fastest way to get your results is to register for My Chart.   IF you received an x-ray today, you will receive an invoice from [facility]. Please contact [facility] at [phone] with questions or concerns regarding your invoice.   IF you received labwork today, you will receive an invoice from [facility]. Please contact [facility] at [phone] with questions or concerns regarding your invoice.   Our billing staff will not be able to assist you with questions regarding bills from these companies.  You will be contacted with the lab results as soon as they are available. The fastest way to get your results is to activate your My Chart account. Instructions are located on the last page of this paperwork. If you have not heard from [place] regarding the results in 2 weeks, please contact this office.      Pharyngitis Pharyngitis is a sore throat (pharynx). There is redness, pain, and swelling of your throat. Follow these instructions at home:  Drink enough fluids to keep your pee (urine) clear or pale yellow.  Only take medicine as told by your doctor. ? You may get sick again if you do not take medicine as told. Finish your medicines, even if you start to feel better. ? Do not take aspirin.  Rest.  Rinse your mouth (gargle) with salt water ( tsp of salt per 1 qt of water) every 1-2 hours. This will help the pain.  If you are not at risk for choking, you can suck on hard candy or sore throat lozenges. Contact a doctor if:  You have large, tender lumps on your neck.  You have a rash.  You cough up green, yellow-brown, or bloody spit. Get help right away if:  You have a stiff neck.  You drool or cannot swallow liquids.  You throw up (vomit) or are not able to keep medicine or liquids down.  You have  very bad pain that does not go away with medicine.  You have problems breathing (not from a stuffy nose). This information is not intended to replace advice given to you by your health care provider. Make sure you discuss any questions you have with your health care provider. Document Released: 02/01/2008 Document Revised: 01/21/2016 Document Reviewed: 04/22/2013 Elsevier Interactive Patient Education  2017 [employer].

## 2018-08-14 LAB — CULTURE, GROUP A STREP

## 2018-09-13 ENCOUNTER — Encounter: Payer: Self-pay | Admitting: Emergency Medicine

## 2018-09-13 ENCOUNTER — Ambulatory Visit: Payer: 59 | Admitting: Emergency Medicine

## 2018-09-13 ENCOUNTER — Other Ambulatory Visit: Payer: Self-pay

## 2018-09-13 VITALS — BP 149/93 | HR 84 | Temp 98.5°F | Resp 16 | Ht 69.5 in | Wt 188.0 lb

## 2018-09-13 DIAGNOSIS — Z7689 Persons encountering health services in other specified circumstances: Secondary | ICD-10-CM

## 2018-09-13 DIAGNOSIS — E119 Type 2 diabetes mellitus without complications: Secondary | ICD-10-CM

## 2018-09-13 DIAGNOSIS — E785 Hyperlipidemia, unspecified: Secondary | ICD-10-CM

## 2018-09-13 DIAGNOSIS — J040 Acute laryngitis: Secondary | ICD-10-CM | POA: Diagnosis not present

## 2018-09-13 DIAGNOSIS — I10 Essential (primary) hypertension: Secondary | ICD-10-CM | POA: Diagnosis not present

## 2018-09-13 MED ORDER — LISINOPRIL 10 MG PO TABS: 10.0000 mg | ORAL_TABLET | Freq: Every morning | ORAL | 1 refills | Status: DC

## 2018-09-13 MED ORDER — PREDNISONE 20 MG PO TABS: 40.0000 mg | ORAL_TABLET | Freq: Every day | ORAL | 0 refills | Status: AC

## 2018-09-13 MED ORDER — METFORMIN HCL ER 500 MG PO TB24: ORAL_TABLET | ORAL | 1 refills | Status: DC

## 2018-09-13 MED ORDER — ROSUVASTATIN CALCIUM 5 MG PO TABS: 5.0000 mg | ORAL_TABLET | Freq: Every day | ORAL | 1 refills | Status: DC

## 2018-09-13 MED ORDER — AMLODIPINE BESYLATE 5 MG PO TABS: 5.0000 mg | ORAL_TABLET | Freq: Every morning | ORAL | 1 refills | Status: DC

## 2018-09-13 NOTE — Patient Instructions (Signed)
Laryngitis  Laryngitis is irritation and swelling (inflammation) of your vocal cords. This condition causes symptoms such as:  · A change in your voice. It may sound low and hoarse.  · Loss of voice.  · Coughing.  · Sore throat.  · Dry throat.  · Stuffy nose.  Depending on the cause, this condition may go away after a short time or may last for more than 3 weeks. Treatment often involves resting your voice and using medicines to soothe your throat.  Follow these instructions at home:  Medicines  · Take over-the-counter and prescription medicines only as told by your doctor.  · If you were prescribed an antibiotic medicine, take it as told by your doctor. Do not stop taking it even if you start to feel better.  General instructions  · Talk as little as possible. Also avoid whispering.  · Write instead of talking. Do this until your voice is back to normal.  · Drink enough fluid to keep your pee (urine) pale yellow.  · Breathe in moist air. Use a humidifier if you live in a dry climate.  · Do not use any products that have nicotine or tobacco in them, such as cigarettes and e-cigarettes. If you need help quitting, ask your doctor.  Contact a doctor if:  · You have a fever.  · Your pain is worse.  · Your symptoms do not get better in 2 weeks.  Get help right away if:  · You cough up blood.  · You have trouble swallowing.  · You have trouble breathing.  Summary  · Laryngitis is inflammation of your vocal cords.  · This condition causes your voice to sound low and hoarse.  · Rest your voice by talking as little as possible. Also avoid whispering.  This information is not intended to replace advice given to you by your health care provider. Make sure you discuss any questions you have with your health care provider.  Document Released: 08/04/2011 Document Revised: 08/02/2017 Document Reviewed: 08/02/2017  Elsevier Interactive Patient Education © 2019 Elsevier Inc.

## 2018-09-13 NOTE — Progress Notes (Signed)
Jacob Chan 63 y.o.   Chief Complaint  Patient presents with  . Establish Care  . Cough    x 4 weeks per patient voice sounds different    HISTORY OF PRESENT ILLNESS: This is a 63 y.o. male here to establish care with me.  Works at the [employer].  Is also a Environmental education officer.  Had a throat infection on 08/11/2018.  Better but still complaining of raspy voice some discomfort.  Voice sounds different.  Intermittent cough.  Has a history of hypertension and diabetes.  HPI   Prior to Admission medications   Medication Sig Start Date End Date Taking? Authorizing Provider  acetaminophen (TYLENOL) 500 MG tablet Take 1,000 mg by mouth every 6 (six) hours as needed. PAIN   Yes [provider]  amLODipine (NORVASC) 5 MG tablet Take 1 tablet (5 mg total) by mouth every morning. 08/11/18  Yes Scot Jun, FNP  ascorbic acid (VITAMIN C) 500 MG tablet Take 500 mg by mouth daily.   Yes [provider]  aspirin EC 81 MG tablet Take 81 mg by mouth daily.   Yes [provider]  atorvastatin (LIPITOR) 10 MG tablet Take 10 mg by mouth daily.   Yes [provider]  lisinopril (PRINIVIL,ZESTRIL) 10 MG tablet Take 1 tablet (10 mg total) by mouth every morning. 08/11/18  Yes Scot Jun, FNP  metFORMIN (GLUCOPHAGE-XR) 500 MG 24 hr tablet TAKE 1 TABLET WITH BREAKFAST 08/11/18  Yes Scot Jun, FNP  Multiple Vitamins-Minerals (CENTRUM SILVER PO) Take 1 tablet by mouth daily.   Yes [provider]  rosuvastatin (CRESTOR) 5 MG tablet Take 1 tablet (5 mg total) by mouth daily. 08/11/18  Yes Scot Jun, FNP  sildenafil (REVATIO) 20 MG tablet TAKE TWO TABLETS BY MOUTH APPROXIMATELY 30 TO 60 MINUTES BEFORE INTERCOURSE. 01/18/18  Yes Weber, Sarah L, PA-C  diclofenac sodium (VOLTAREN) 1 % GEL Apply 2 g topically 2 (two) times daily. 09/28/17   Tenna Delaine D, PA-C  predniSONE (DELTASONE) 20 MG tablet Take 2 tablets (40 mg total) by mouth daily  with breakfast for 5 days. 09/13/18 09/18/18  Horald Pollen, MD    No Known Allergies  Patient Active Problem List   Diagnosis Date Noted  . Hypertension 12/20/2016  . Diabetes mellitus 12/20/2016    Past Medical History:  Diagnosis Date  . Chronic kidney disease   . Diabetes mellitus   . Hypertension     Past Surgical History:  Procedure Laterality Date  . LITHOTRIPSY     3 yrs ago    Social History   Socioeconomic History  . Marital status: Married    Spouse name: [name]  . Number of children: 4  . Years of education: Not on file  . Highest education level: Not on file  Occupational History  . Not on file  Social Needs  . Financial resource strain: Not on file  . Food insecurity:    Worry: Not on file    Inability: Not on file  . Transportation needs:    Medical: Not on file    Non-medical: Not on file  Tobacco Use  . Smoking status: Never Smoker  . Smokeless tobacco: Never Used  Substance and Sexual Activity  . Alcohol use: No  . Drug use: No  . Sexual activity: Yes  Lifestyle  . Physical activity:    Days per week: Not on file    Minutes per session: Not on file  .  Stress: Not on file  Relationships  . Social connections:    Talks on phone: Not on file    Gets together: Not on file    Attends religious service: Not on file    Active member of club or organization: Not on file    Attends meetings of clubs or organizations: Not on file    Relationship status: Not on file  . Intimate partner violence:    Fear of current or ex partner: Not on file    Emotionally abused: Not on file    Physically abused: Not on file    Forced sexual activity: Not on file  Other Topics Concern  . Not on file  Social History Narrative  . Not on file    Family History  Problem Relation Age of Onset  . Diabetes Mother   . Heart disease Mother   . Diabetes Father      Review of Systems  Constitutional: Negative.  Negative for chills and fever.  HENT:  Positive for sore throat. Negative for congestion and nosebleeds.   Eyes: Negative.   Respiratory: Negative.  Negative for cough and shortness of breath.   Cardiovascular: Negative.  Negative for chest pain and palpitations.  Gastrointestinal: Negative.  Negative for abdominal pain, nausea and vomiting.  Genitourinary: Negative.   Musculoskeletal: Negative.   Skin: Negative for rash.  Neurological: Negative.  Negative for dizziness and headaches.  Endo/Heme/Allergies: Negative.   All other systems reviewed and are negative.   Vitals:   09/13/18 1548  BP: (!) 149/93  Pulse: 84  Resp: 16  Temp: 98.5 F (36.9 C)  SpO2: 99%    Physical Exam Vitals signs reviewed.  HENT:     Head: Normocephalic and atraumatic.     Nose: Nose normal.     Mouth/Throat:     Mouth: Mucous membranes are moist.     Pharynx: Oropharynx is clear.  Eyes:     Extraocular Movements: Extraocular movements intact.     Conjunctiva/sclera: Conjunctivae normal.     Pupils: Pupils are equal, round, and reactive to light.  Neck:     Musculoskeletal: Normal range of motion and neck supple.  Cardiovascular:     Rate and Rhythm: Normal rate and regular rhythm.     Heart sounds: Normal heart sounds.  Pulmonary:     Effort: Pulmonary effort is normal.     Breath sounds: Normal breath sounds.  Abdominal:     General: There is no distension.     Palpations: Abdomen is soft.     Tenderness: There is no abdominal tenderness.  Musculoskeletal: Normal range of motion.  Skin:    General: Skin is warm and dry.     Capillary Refill: Capillary refill takes less than 2 seconds.  Neurological:     General: No focal deficit present.     Mental Status: He is alert and oriented to person, place, and time.     Sensory: No sensory deficit.     Motor: No weakness.     Coordination: Coordination normal.  Psychiatric:        Mood and Affect: Mood normal.        Behavior: Behavior normal.    A total of 25 minutes was  spent in the room with the patient, greater than 50% of which was in counseling/coordination of care regarding chronic medical problems, treatment, medications, prognosis, and need for follow-up.    ASSESSMENT & PLAN: Jacob Chan was seen today for establish care and  cough.  Diagnoses and all orders for this visit:  Laryngitis -     predniSONE (DELTASONE) 20 MG tablet; Take 2 tablets (40 mg total) by mouth daily with breakfast for 5 days.  Encounter to establish care  Essential hypertension -     lisinopril (PRINIVIL,ZESTRIL) 10 MG tablet; Take 1 tablet (10 mg total) by mouth every morning. -     amLODipine (NORVASC) 5 MG tablet; Take 1 tablet (5 mg total) by mouth every morning.  Type 2 diabetes mellitus without complication, without long-term current use of insulin (HCC) -     metFORMIN (GLUCOPHAGE-XR) 500 MG 24 hr tablet; TAKE 1 TABLET WITH BREAKFAST  Hyperlipidemia, unspecified hyperlipidemia type -     rosuvastatin (CRESTOR) 5 MG tablet; Take 1 tablet (5 mg total) by mouth daily.    Patient Instructions  Laryngitis Laryngitis is irritation and swelling (inflammation) of your vocal cords. This condition causes symptoms such as:  A change in your voice. It may sound low and hoarse.  Loss of voice.  Coughing.  Sore throat.  Dry throat.  Stuffy nose. Depending on the cause, this condition may go away after a short time or may last for more than 3 weeks. Treatment often involves resting your voice and using medicines to soothe your throat. Follow these instructions at home: Medicines  Take over-the-counter and prescription medicines only as told by your doctor.  If you were prescribed an antibiotic medicine, take it as told by your doctor. Do not stop taking it even if you start to feel better. General instructions  Talk as little as possible. Also avoid whispering.  Write instead of talking. Do this until your voice is back to normal.  Drink enough fluid to keep  your pee (urine) pale yellow.  Breathe in moist air. Use a humidifier if you live in a dry climate.  Do not use any products that have nicotine or tobacco in them, such as cigarettes and e-cigarettes. If you need help quitting, ask your doctor. Contact a doctor if:  You have a fever.  Your pain is worse.  Your symptoms do not get better in 2 weeks. Get help right away if:  You cough up blood.  You have trouble swallowing.  You have trouble breathing. Summary  Laryngitis is inflammation of your vocal cords.  This condition causes your voice to sound low and hoarse.  Rest your voice by talking as little as possible. Also avoid whispering. This information is not intended to replace advice given to you by your health care provider. Make sure you discuss any questions you have with your health care provider. Document Released: 08/04/2011 Document Revised: 08/02/2017 Document Reviewed: 08/02/2017 Elsevier Interactive Patient Education  2019 [employer] Inc.      Agustina Caroli, MD [facility]

## 2019-02-18 ENCOUNTER — Encounter: Payer: Self-pay | Admitting: Emergency Medicine

## 2019-02-18 ENCOUNTER — Ambulatory Visit: Payer: 59 | Admitting: Emergency Medicine

## 2019-02-18 ENCOUNTER — Other Ambulatory Visit: Payer: Self-pay

## 2019-02-18 VITALS — BP 130/84 | HR 92 | Temp 98.2°F | Ht 69.5 in | Wt 188.4 lb

## 2019-02-18 DIAGNOSIS — E119 Type 2 diabetes mellitus without complications: Secondary | ICD-10-CM

## 2019-02-18 DIAGNOSIS — S43102D Unspecified dislocation of left acromioclavicular joint, subsequent encounter: Secondary | ICD-10-CM

## 2019-02-18 DIAGNOSIS — E785 Hyperlipidemia, unspecified: Secondary | ICD-10-CM

## 2019-02-18 DIAGNOSIS — N529 Male erectile dysfunction, unspecified: Secondary | ICD-10-CM

## 2019-02-18 DIAGNOSIS — Z7689 Persons encountering health services in other specified circumstances: Secondary | ICD-10-CM | POA: Diagnosis not present

## 2019-02-18 DIAGNOSIS — I10 Essential (primary) hypertension: Secondary | ICD-10-CM

## 2019-02-18 MED ORDER — LISINOPRIL 10 MG PO TABS: 10.0000 mg | ORAL_TABLET | Freq: Every morning | ORAL | 1 refills | Status: DC

## 2019-02-18 MED ORDER — DICLOFENAC SODIUM 1 % TD GEL: 2.0000 g | Freq: Two times a day (BID) | TRANSDERMAL | 0 refills | Status: AC

## 2019-02-18 MED ORDER — SILDENAFIL CITRATE 20 MG PO TABS: ORAL_TABLET | ORAL | 2 refills | Status: DC

## 2019-02-18 MED ORDER — ROSUVASTATIN CALCIUM 5 MG PO TABS: 5.0000 mg | ORAL_TABLET | Freq: Every day | ORAL | 1 refills | Status: DC

## 2019-02-18 MED ORDER — METFORMIN HCL ER 500 MG PO TB24: ORAL_TABLET | ORAL | 1 refills | Status: DC

## 2019-02-18 NOTE — Progress Notes (Signed)
Jacob Chan 63 y.o.   Chief Complaint  Patient presents with  . Diabetes  . Hypertension  . Night Sweats    wants to talk about the rouvastatin he is taking, thinks it causes night sweat    HISTORY OF PRESENT ILLNESS: This is a 63 y.o. male with history of diabetes and hypertension here for follow-up and medication refill. Has a history of intermittent chronic night sweats for the past 2 years. No other complaints or medical concerns today.  HPI   Prior to Admission medications   Medication Sig Start Date End Date Taking? Authorizing Provider  acetaminophen (TYLENOL) 500 MG tablet Take 1,000 mg by mouth every 6 (six) hours as needed. PAIN   Yes [provider]  amLODipine (NORVASC) 5 MG tablet Take 1 tablet (5 mg total) by mouth every morning. 09/13/18 02/18/19 Yes Alisse Tuite, Ines Bloomer, MD  ascorbic acid (VITAMIN C) 500 MG tablet Take 500 mg by mouth daily.   Yes [provider]  aspirin EC 81 MG tablet Take 81 mg by mouth daily.   Yes [provider]  diclofenac sodium (VOLTAREN) 1 % GEL Apply 2 g topically 2 (two) times daily. 09/28/17  Yes Timmothy Euler, Tanzania D, PA-C  lisinopril (PRINIVIL,ZESTRIL) 10 MG tablet Take 1 tablet (10 mg total) by mouth every morning. 09/13/18 02/18/19 Yes Kore Madlock, Ines Bloomer, MD  metFORMIN (GLUCOPHAGE-XR) 500 MG 24 hr tablet TAKE 1 TABLET WITH BREAKFAST 09/13/18  Yes Akshar Starnes, Ines Bloomer, MD  Multiple Vitamins-Minerals (CENTRUM SILVER PO) Take 1 tablet by mouth daily.   Yes [provider]  sildenafil (REVATIO) 20 MG tablet TAKE TWO TABLETS BY MOUTH APPROXIMATELY 30 TO 60 MINUTES BEFORE INTERCOURSE. 01/18/18  Yes Weber, Sarah L, PA-C  rosuvastatin (CRESTOR) 5 MG tablet Take 1 tablet (5 mg total) by mouth daily. 09/13/18 12/12/18  Horald Pollen, MD    No Known Allergies  Patient Active Problem List   Diagnosis Date Noted  . Hypertension 12/20/2016  . Diabetes mellitus 12/20/2016    Past Medical  History:  Diagnosis Date  . Chronic kidney disease   . Diabetes mellitus   . Hypertension     Past Surgical History:  Procedure Laterality Date  . LITHOTRIPSY     3 yrs ago    Social History   Socioeconomic History  . Marital status: Married    Spouse name: [name]  . Number of children: 4  . Years of education: Not on file  . Highest education level: Not on file  Occupational History  . Not on file  Social Needs  . Financial resource strain: Not on file  . Food insecurity    Worry: Not on file    Inability: Not on file  . Transportation needs    Medical: Not on file    Non-medical: Not on file  Tobacco Use  . Smoking status: Never Smoker  . Smokeless tobacco: Never Used  Substance and Sexual Activity  . Alcohol use: No  . Drug use: No  . Sexual activity: Yes  Lifestyle  . Physical activity    Days per week: Not on file    Minutes per session: Not on file  . Stress: Not on file  Relationships  . Social Herbalist on phone: Not on file    Gets together: Not on file    Attends religious service: Not on file    Active member of club or organization: Not on file    Attends meetings  of clubs or organizations: Not on file    Relationship status: Not on file  . Intimate partner violence    Fear of current or ex partner: Not on file    Emotionally abused: Not on file    Physically abused: Not on file    Forced sexual activity: Not on file  Other Topics Concern  . Not on file  Social History Narrative  . Not on file    Family History  Problem Relation Age of Onset  . Diabetes Mother   . Heart disease Mother   . Diabetes Father      Review of Systems  Constitutional: Negative.  Negative for chills and fever.  HENT: Negative.  Negative for congestion, sinus pain and sore throat.   Eyes: Negative.  Negative for blurred vision and double vision.  Respiratory: Negative.  Negative for cough and shortness of breath.   Cardiovascular: Negative.   Negative for chest pain.  Gastrointestinal: Negative.  Negative for abdominal pain, diarrhea, nausea and vomiting.  Genitourinary: Negative.  Negative for dysuria and hematuria.  Musculoskeletal: Negative.  Negative for back pain, myalgias and neck pain.  Skin: Negative.  Negative for rash.  Neurological: Negative for dizziness and headaches.  Endo/Heme/Allergies: Negative.   All other systems reviewed and are negative.  Today's Vitals   02/18/19 1650  BP: 130/84  Pulse: 92  Temp: 98.2 F (36.8 C)  TempSrc: Oral  SpO2: 97%  Weight: 188 lb 6.4 oz (85.5 kg)  Height: 5' 9.5" (1.765 m)   Body mass index is 27.42 kg/m.   Physical Exam Vitals signs reviewed.  HENT:     Head: Normocephalic and atraumatic.  Eyes:     Extraocular Movements: Extraocular movements intact.     Pupils: Pupils are equal, round, and reactive to light.  Neck:     Musculoskeletal: Normal range of motion and neck supple.  Cardiovascular:     Rate and Rhythm: Normal rate and regular rhythm.     Pulses: Normal pulses.     Heart sounds: Normal heart sounds.  Pulmonary:     Effort: Pulmonary effort is normal.     Breath sounds: Normal breath sounds.  Abdominal:     Palpations: Abdomen is soft.     Tenderness: There is no abdominal tenderness.  Musculoskeletal: Normal range of motion.  Skin:    General: Skin is warm and dry.     Capillary Refill: Capillary refill takes less than 2 seconds.  Neurological:     General: No focal deficit present.     Mental Status: He is alert and oriented to person, place, and time.  Psychiatric:        Mood and Affect: Mood normal.        Behavior: Behavior normal.    Results for orders placed or performed in visit on 02/18/19 (from the past 24 hour(s))  Lipid panel     Status: None   Collection Time: 02/18/19  4:54 PM  Result Value Ref Range   Cholesterol, Total 161 100 - 199 mg/dL   Triglycerides 104 0 - 149 mg/dL   HDL 61 >39 mg/dL   VLDL Cholesterol Cal 21 5  - 40 mg/dL   LDL Calculated 79 0 - 99 mg/dL   Chol/HDL Ratio 2.6 0.0 - 5.0 ratio   Narrative   Performed at:  [address], [place], [place]  [id number] Lab Director: Rush Farmer MD, Phone:  [phone]  CMP14+EGFR  Status: Abnormal   Collection Time: 02/18/19  4:54 PM  Result Value Ref Range   Glucose 133 (H) 65 - 99 mg/dL   BUN 20 8 - 27 mg/dL   Creatinine, Ser 1.18 0.76 - 1.27 mg/dL   GFR calc non Af Amer 65 >59 mL/min/1.73   GFR calc Af Amer 75 >59 mL/min/1.73   BUN/Creatinine Ratio 17 10 - 24   Sodium 140 134 - 144 mmol/L   Potassium 4.7 3.5 - 5.2 mmol/L   Chloride 102 96 - 106 mmol/L   CO2 25 20 - 29 mmol/L   Calcium 9.4 8.6 - 10.2 mg/dL   Total Protein 6.5 6.0 - 8.5 g/dL   Albumin 4.4 3.8 - 4.8 g/dL   Globulin, Total 2.1 1.5 - 4.5 g/dL   Albumin/Globulin Ratio 2.1 1.2 - 2.2   Bilirubin Total 0.4 0.0 - 1.2 mg/dL   Alkaline Phosphatase 61 39 - 117 IU/L   AST 12 0 - 40 IU/L   ALT 15 0 - 44 IU/L   Narrative   Performed at:  [address] [place], [place]  [id number] Lab Director: Rush Farmer MD, Phone:  [phone]  Hemoglobin A1c     Status: Abnormal   Collection Time: 02/18/19  4:54 PM  Result Value Ref Range   Hgb A1c MFr Bld 6.6 (H) 4.8 - 5.6 %   Est. average glucose Bld gHb Est-mCnc 143 mg/dL   Narrative   Performed at:  [address] [place], [place]  [id number] Lab Director: Rush Farmer MD, Phone:  [phone]  Microalbumin / creatinine urine ratio     Status: None   Collection Time: 02/18/19  5:03 PM  Result Value Ref Range   Creatinine, Urine 221.1 Not Estab. mg/dL   Microalbumin, Urine 16.0 Not Estab. ug/mL   Microalb/Creat Ratio 7 0 - 29 mg/g creat   Narrative   Performed at:  [address] [facility] [address], [place], [place]  [id number] Lab Director: Rush Farmer MD, Phone:  [phone]     ASSESSMENT & PLAN: Noah was seen today for diabetes,  hypertension and night sweats.  Diagnoses and all orders for this visit:  Essential hypertension -     Lipid panel -     CMP14+EGFR -     lisinopril (ZESTRIL) 10 MG tablet; Take 1 tablet (10 mg total) by mouth every morning.  Encounter to establish care  Type 2 diabetes mellitus without complication, without long-term current use of insulin (HCC) -     Lipid panel -     CMP14+EGFR -     Hemoglobin A1c -     Microalbumin / creatinine urine ratio -     metFORMIN (GLUCOPHAGE-XR) 500 MG 24 hr tablet; TAKE 1 TABLET WITH BREAKFAST  Hyperlipidemia, unspecified hyperlipidemia type -     Lipid panel -     CMP14+EGFR -     rosuvastatin (CRESTOR) 5 MG tablet; Take 1 tablet (5 mg total) by mouth daily.  Erectile dysfunction, unspecified erectile dysfunction type -     sildenafil (REVATIO) 20 MG tablet; TAKE TWO TABLETS BY MOUTH APPROXIMATELY 30 TO 60 MINUTES BEFORE INTERCOURSE.  AC separation, left, subsequent encounter -     diclofenac sodium (VOLTAREN) 1 % GEL; Apply 2 g topically 2 (two) times daily.     Patient Instructions       If you have lab work done today you will be contacted with your lab results within the  next 2 weeks.  If you have not heard from [place] then please contact us. The fastest way to get your results is to register for My Chart.   IF you received an x-ray today, you will receive an invoice from [facility]. Please contact [facility] at [phone] with questions or concerns regarding your invoice.   IF you received labwork today, you will receive an invoice from [facility]. Please contact [facility] at [phone] with questions or concerns regarding your invoice.   Our billing staff will not be able to assist you with questions regarding bills from these companies.  You will be contacted with the lab results as soon as they are available. The fastest way to get your results is to activate your My Chart account. Instructions are located on the  last page of this paperwork. If you have not heard from [place] regarding the results in 2 weeks, please contact this office.     Health Maintenance After Age 84 After age 7, you are at a higher risk for certain long-term diseases and infections as well as injuries from falls. Falls are a major cause of broken bones and head injuries in people who are older than age 65. Getting regular preventive care can help to keep you healthy and well. Preventive care includes getting regular testing and making lifestyle changes as recommended by your health care provider. Talk with your health care provider about:  Which screenings and tests you should have. A screening is a test that checks for a disease when you have no symptoms.  A diet and exercise plan that is right for you. What should I know about screenings and tests to prevent falls? Screening and testing are the best ways to find a health problem early. Early diagnosis and treatment give you the best chance of managing medical conditions that are common after age 29. Certain conditions and lifestyle choices may make you more likely to have a fall. Your health care provider may recommend:  Regular vision checks. Poor vision and conditions such as cataracts can make you more likely to have a fall. If you wear glasses, make sure to get your prescription updated if your vision changes.  Medicine review. Work with your health care provider to regularly review all of the medicines you are taking, including over-the-counter medicines. Ask your health care provider about any side effects that may make you more likely to have a fall. Tell your health care provider if any medicines that you take make you feel dizzy or sleepy.  Osteoporosis screening. Osteoporosis is a condition that causes the bones to get weaker. This can make the bones weak and cause them to break more easily.  Blood pressure screening. Blood pressure changes and medicines to control blood  pressure can make you feel dizzy.  Strength and balance checks. Your health care provider may recommend certain tests to check your strength and balance while standing, walking, or changing positions.  Foot health exam. Foot pain and numbness, as well as not wearing proper footwear, can make you more likely to have a fall.  Depression screening. You may be more likely to have a fall if you have a fear of falling, feel emotionally low, or feel unable to do activities that you used to do.  Alcohol use screening. Using too much alcohol can affect your balance and may make you more likely to have a fall. What actions can I take to lower my risk of falls? General instructions  Talk with  your health care provider about your risks for falling. Tell your health care provider if: ? You fall. Be sure to tell your health care provider about all falls, even ones that seem minor. ? You feel dizzy, sleepy, or off-balance.  Take over-the-counter and prescription medicines only as told by your health care provider. These include any supplements.  Eat a healthy diet and maintain a healthy weight. A healthy diet includes low-fat dairy products, low-fat (lean) meats, and fiber from whole grains, beans, and lots of fruits and vegetables. Home safety  Remove any tripping hazards, such as rugs, cords, and clutter.  Install safety equipment such as grab bars in bathrooms and safety rails on stairs.  Keep rooms and walkways well-lit. Activity   Follow a regular exercise program to stay fit. This will help you maintain your balance. Ask your health care provider what types of exercise are appropriate for you.  If you need a cane or walker, use it as recommended by your health care provider.  Wear supportive shoes that have nonskid soles. Lifestyle  Do not drink alcohol if your health care provider tells you not to drink.  If you drink alcohol, limit how much you have: ? 0-1 drink a day for women. ?  0-2 drinks a day for men.  Be aware of how much alcohol is in your drink. In the [place] one drink equals one typical bottle of beer (12 oz), one-half glass of wine (5 oz), or one shot of hard liquor (1 oz).  Do not use any products that contain nicotine or tobacco, such as cigarettes and e-cigarettes. If you need help quitting, ask your health care provider. Summary  Having a healthy lifestyle and getting preventive care can help to protect your health and wellness after age 35.  Screening and testing are the best way to find a health problem early and help you avoid having a fall. Early diagnosis and treatment give you the best chance for managing medical conditions that are more common for people who are older than age 75.  Falls are a major cause of broken bones and head injuries in people who are older than age 31. Take precautions to prevent a fall at home.  Work with your health care provider to learn what changes you can make to improve your health and wellness and to prevent falls. This information is not intended to replace advice given to you by your health care provider. Make sure you discuss any questions you have with your health care provider. Document Released: 06/28/2017 Document Revised: 06/28/2017 Document Reviewed: 06/28/2017 Elsevier Interactive Patient Education  2019 Elsevier Inc.     Agustina Caroli, MD [facility]

## 2019-02-18 NOTE — Patient Instructions (Addendum)
   If you have lab work done today you will be contacted with your lab results within the next 2 weeks.  If you have not heard from us then please contact us. The fastest way to get your results is to register for My Chart.   IF you received an x-ray today, you will receive an invoice from [facility]. Please contact [facility] at [phone] with questions or concerns regarding your invoice.   IF you received labwork today, you will receive an invoice from [facility]. Please contact [facility] at [phone] with questions or concerns regarding your invoice.   Our billing staff will not be able to assist you with questions regarding bills from these companies.  You will be contacted with the lab results as soon as they are available. The fastest way to get your results is to activate your My Chart account. Instructions are located on the last page of this paperwork. If you have not heard from us regarding the results in 2 weeks, please contact this office.     Health Maintenance After Age 65 After age 65, you are at a higher risk for certain long-term diseases and infections as well as injuries from falls. Falls are a major cause of broken bones and head injuries in people who are older than age 65. Getting regular preventive care can help to keep you healthy and well. Preventive care includes getting regular testing and making lifestyle changes as recommended by your health care provider. Talk with your health care provider about:  Which screenings and tests you should have. A screening is a test that checks for a disease when you have no symptoms.  A diet and exercise plan that is right for you. What should I know about screenings and tests to prevent falls? Screening and testing are the best ways to find a health problem early. Early diagnosis and treatment give you the best chance of managing medical conditions that are common after age 65. Certain conditions and  lifestyle choices may make you more likely to have a fall. Your health care provider may recommend:  Regular vision checks. Poor vision and conditions such as cataracts can make you more likely to have a fall. If you wear glasses, make sure to get your prescription updated if your vision changes.  Medicine review. Work with your health care provider to regularly review all of the medicines you are taking, including over-the-counter medicines. Ask your health care provider about any side effects that may make you more likely to have a fall. Tell your health care provider if any medicines that you take make you feel dizzy or sleepy.  Osteoporosis screening. Osteoporosis is a condition that causes the bones to get weaker. This can make the bones weak and cause them to break more easily.  Blood pressure screening. Blood pressure changes and medicines to control blood pressure can make you feel dizzy.  Strength and balance checks. Your health care provider may recommend certain tests to check your strength and balance while standing, walking, or changing positions.  Foot health exam. Foot pain and numbness, as well as not wearing proper footwear, can make you more likely to have a fall.  Depression screening. You may be more likely to have a fall if you have a fear of falling, feel emotionally low, or feel unable to do activities that you used to do.  Alcohol use screening. Using too much alcohol can affect your balance and may make you more likely to   have a fall. What actions can I take to lower my risk of falls? General instructions  Talk with your health care provider about your risks for falling. Tell your health care provider if: ? You fall. Be sure to tell your health care provider about all falls, even ones that seem minor. ? You feel dizzy, sleepy, or off-balance.  Take over-the-counter and prescription medicines only as told by your health care provider. These include any  supplements.  Eat a healthy diet and maintain a healthy weight. A healthy diet includes low-fat dairy products, low-fat (lean) meats, and fiber from whole grains, beans, and lots of fruits and vegetables. Home safety  Remove any tripping hazards, such as rugs, cords, and clutter.  Install safety equipment such as grab bars in bathrooms and safety rails on stairs.  Keep rooms and walkways well-lit. Activity   Follow a regular exercise program to stay fit. This will help you maintain your balance. Ask your health care provider what types of exercise are appropriate for you.  If you need a cane or walker, use it as recommended by your health care provider.  Wear supportive shoes that have nonskid soles. Lifestyle  Do not drink alcohol if your health care provider tells you not to drink.  If you drink alcohol, limit how much you have: ? 0-1 drink a day for women. ? 0-2 drinks a day for men.  Be aware of how much alcohol is in your drink. In the [place] one drink equals one typical bottle of beer (12 oz), one-half glass of wine (5 oz), or one shot of hard liquor (1 oz).  Do not use any products that contain nicotine or tobacco, such as cigarettes and e-cigarettes. If you need help quitting, ask your health care provider. Summary  Having a healthy lifestyle and getting preventive care can help to protect your health and wellness after age 24.  Screening and testing are the best way to find a health problem early and help you avoid having a fall. Early diagnosis and treatment give you the best chance for managing medical conditions that are more common for people who are older than age 30.  Falls are a major cause of broken bones and head injuries in people who are older than age 3. Take precautions to prevent a fall at home.  Work with your health care provider to learn what changes you can make to improve your health and wellness and to prevent falls. This information is not intended  to replace advice given to you by your health care provider. Make sure you discuss any questions you have with your health care provider. Document Released: 06/28/2017 Document Revised: 06/28/2017 Document Reviewed: 06/28/2017 Elsevier Interactive Patient Education  2019 [employer].

## 2019-02-19 LAB — LIPID PANEL
Chol/HDL Ratio: 2.6 ratio (ref 0.0–5.0)
Cholesterol, Total: 161 mg/dL (ref 100–199)
HDL: 61 mg/dL (ref >39)
LDL Calculated: 79 mg/dL (ref 0–99)
Triglycerides: 104 mg/dL (ref 0–149)
VLDL Cholesterol Cal: 21 mg/dL (ref 5–40)

## 2019-02-19 LAB — CMP14+EGFR
ALT: 15 IU/L (ref 0–44)
AST: 12 IU/L (ref 0–40)
Albumin/Globulin Ratio: 2.1 (ref 1.2–2.2)
Albumin: 4.4 g/dL (ref 3.8–4.8)
Alkaline Phosphatase: 61 IU/L (ref 39–117)
BUN/Creatinine Ratio: 17 (ref 10–24)
BUN: 20 mg/dL (ref 8–27)
Bilirubin Total: 0.4 mg/dL (ref 0.0–1.2)
CO2: 25 mmol/L (ref 20–29)
Calcium: 9.4 mg/dL (ref 8.6–10.2)
Chloride: 102 mmol/L (ref 96–106)
Creatinine, Ser: 1.18 mg/dL (ref 0.76–1.27)
GFR calc Af Amer: 75 mL/min/{1.73_m2} (ref >59)
GFR calc non Af Amer: 65 mL/min/{1.73_m2} (ref >59)
Globulin, Total: 2.1 g/dL (ref 1.5–4.5)
Glucose: 133 mg/dL — ABNORMAL HIGH (ref 65–99)
Potassium: 4.7 mmol/L (ref 3.5–5.2)
Sodium: 140 mmol/L (ref 134–144)
Total Protein: 6.5 g/dL (ref 6.0–8.5)

## 2019-02-19 LAB — MICROALBUMIN / CREATININE URINE RATIO
Creatinine, Urine: 221.1 mg/dL
Microalb/Creat Ratio: 7 mg/g creat (ref 0–29)
Microalbumin, Urine: 16 ug/mL

## 2019-02-19 LAB — HEMOGLOBIN A1C
Est. average glucose Bld gHb Est-mCnc: 143 mg/dL
Hgb A1c MFr Bld: 6.6 % — ABNORMAL HIGH (ref 4.8–5.6)

## 2019-02-21 ENCOUNTER — Telehealth: Payer: 59 | Admitting: Emergency Medicine

## 2019-03-27 LAB — HM DIABETES EYE EXAM

## 2019-04-02 ENCOUNTER — Other Ambulatory Visit: Payer: Self-pay | Admitting: Emergency Medicine

## 2019-04-02 DIAGNOSIS — I10 Essential (primary) hypertension: Secondary | ICD-10-CM

## 2019-04-03 NOTE — Telephone Encounter (Signed)
Requested Prescriptions  Pending Prescriptions Disp Refills  . amLODipine (NORVASC) 5 MG tablet [Pharmacy Med Name: AMLODIPINE BESYLATE 5 MG TAB] 90 tablet 0    Sig: TAKE (1) TABLET DAILY IN THE MORNING.     Cardiovascular:  Calcium Channel Blockers Passed - 04/02/2019  6:42 PM      Passed - Last BP in normal range    BP Readings from Last 1 Encounters:  02/18/19 130/84         Passed - Valid encounter within last 6 months    Recent Outpatient Visits          1 month ago Essential hypertension   Primary Care at [facility], Ines Bloomer, MD   6 months ago Laryngitis   Primary Care at [facility], Ines Bloomer, MD   7 months ago Sore throat   Primary Care at [facility], Carroll Sage, [place]   1 year ago [facility] separation, left, subsequent encounter   Primary Care at [place], Tanzania D, PA-C   1 year ago Type 2 diabetes mellitus without complication, without long-term current use of insulin [facility])   Primary Care at [place], PA-C

## 2019-07-16 ENCOUNTER — Other Ambulatory Visit: Payer: Self-pay | Admitting: Emergency Medicine

## 2019-07-16 DIAGNOSIS — I10 Essential (primary) hypertension: Secondary | ICD-10-CM
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
## 2019-09-05 ENCOUNTER — Encounter: Payer: Self-pay | Admitting: *Deleted

## 2019-09-19 ENCOUNTER — Other Ambulatory Visit: Payer: Self-pay | Admitting: Emergency Medicine

## 2019-09-19 DIAGNOSIS — I10 Essential (primary) hypertension: Secondary | ICD-10-CM

## 2019-09-19 DIAGNOSIS — N529 Male erectile dysfunction, unspecified: Secondary | ICD-10-CM

## 2019-09-19 DIAGNOSIS — E119 Type 2 diabetes mellitus without complications: Secondary | ICD-10-CM

## 2019-09-20 ENCOUNTER — Telehealth: Payer: Self-pay | Admitting: Emergency Medicine

## 2019-09-20 ENCOUNTER — Other Ambulatory Visit: Payer: Self-pay | Admitting: Emergency Medicine

## 2019-09-20 DIAGNOSIS — I10 Essential (primary) hypertension: Secondary | ICD-10-CM

## 2019-09-20 DIAGNOSIS — E785 Hyperlipidemia, unspecified: Secondary | ICD-10-CM

## 2019-09-20 DIAGNOSIS — E119 Type 2 diabetes mellitus without complications: Secondary | ICD-10-CM

## 2019-09-20 NOTE — Telephone Encounter (Signed)
Pt would like 3 pills of metFORMIN (GLUCOPHAGE-XR) 500 MG 24 hr tablet [Pharmacy Med Name: METFORMIN HCL ER [place] [id number] ,amLODipine (NORVASC) 5 MG tablet [Pharmacy Med Name: AMLODIPINE BESYLATE 5 MG TAB] [id number]  And lisinopril (ZESTRIL) 10 MG tablet [Pharmacy Med Name: LISINOPRIL 10 MG TABLET] [id number] Pharmacy  [place], [place] Barnwell  [address] [place], [place] [address]  Phone:  [phone] Fax:  [phone]    He has an appointment on 09/23/19 with Dr. Mitchel Honour for a med refill. Please advise at [phone]

## 2019-09-23 ENCOUNTER — Encounter: Payer: Self-pay | Admitting: Emergency Medicine

## 2019-09-23 ENCOUNTER — Ambulatory Visit: Payer: 59 | Admitting: Emergency Medicine

## 2019-09-23 ENCOUNTER — Other Ambulatory Visit: Payer: Self-pay

## 2019-09-23 VITALS — BP 167/99 | HR 81 | Temp 97.7°F | Wt 191.4 lb

## 2019-09-23 DIAGNOSIS — E785 Hyperlipidemia, unspecified: Secondary | ICD-10-CM | POA: Diagnosis not present

## 2019-09-23 DIAGNOSIS — Z23 Encounter for immunization: Secondary | ICD-10-CM | POA: Diagnosis not present

## 2019-09-23 DIAGNOSIS — E119 Type 2 diabetes mellitus without complications: Secondary | ICD-10-CM

## 2019-09-23 DIAGNOSIS — I10 Essential (primary) hypertension: Secondary | ICD-10-CM

## 2019-09-23 DIAGNOSIS — N200 Calculus of kidney: Secondary | ICD-10-CM

## 2019-09-23 DIAGNOSIS — E1159 Type 2 diabetes mellitus with other circulatory complications: Secondary | ICD-10-CM

## 2019-09-23 DIAGNOSIS — I152 Hypertension secondary to endocrine disorders: Secondary | ICD-10-CM

## 2019-09-23 DIAGNOSIS — E1169 Type 2 diabetes mellitus with other specified complication: Secondary | ICD-10-CM

## 2019-09-23 DIAGNOSIS — Z1211 Encounter for screening for malignant neoplasm of colon: Secondary | ICD-10-CM

## 2019-09-23 LAB — POCT URINALYSIS DIP (MANUAL ENTRY)
Bilirubin, UA: NEGATIVE
Blood, UA: NEGATIVE
Glucose, UA: NEGATIVE mg/dL
Leukocytes, UA: NEGATIVE
Nitrite, UA: NEGATIVE
Spec Grav, UA: 1.025 (ref 1.010–1.025)
Urobilinogen, UA: 0.2 E.U./dL
pH, UA: 6.5 (ref 5.0–8.0)

## 2019-09-23 MED ORDER — AMLODIPINE BESYLATE 5 MG PO TABS: 5.0000 mg | ORAL_TABLET | Freq: Every morning | ORAL | 3 refills | Status: DC

## 2019-09-23 MED ORDER — LISINOPRIL 20 MG PO TABS: 20.0000 mg | ORAL_TABLET | Freq: Every day | ORAL | 3 refills | Status: DC

## 2019-09-23 MED ORDER — ROSUVASTATIN CALCIUM 5 MG PO TABS: 5.0000 mg | ORAL_TABLET | Freq: Every day | ORAL | 3 refills | Status: DC

## 2019-09-23 MED ORDER — METFORMIN HCL ER 500 MG PO TB24: ORAL_TABLET | ORAL | 3 refills | Status: DC

## 2019-09-23 NOTE — Patient Instructions (Addendum)
   If you have lab work done today you will be contacted with your lab results within the next 2 weeks.  If you have not heard from us then please contact us. The fastest way to get your results is to register for My Chart.   IF you received an x-ray today, you will receive an invoice from [facility]. Please contact [facility] at [phone] with questions or concerns regarding your invoice.   IF you received labwork today, you will receive an invoice from [facility]. Please contact [facility] at [phone] with questions or concerns regarding your invoice.   Our billing staff will not be able to assist you with questions regarding bills from these companies.  You will be contacted with the lab results as soon as they are available. The fastest way to get your results is to activate your My Chart account. Instructions are located on the last page of this paperwork. If you have not heard from us regarding the results in 2 weeks, please contact this office.      Hypertension, Adult High blood pressure (hypertension) is when the force of blood pumping through the arteries is too strong. The arteries are the blood vessels that carry blood from the heart throughout the body. Hypertension forces the heart to work harder to pump blood and may cause arteries to become narrow or stiff. Untreated or uncontrolled hypertension can cause a heart attack, heart failure, a stroke, kidney disease, and other problems. A blood pressure reading consists of a higher number over a lower number. Ideally, your blood pressure should be below 120/80. The first ("top") number is called the systolic pressure. It is a measure of the pressure in your arteries as your heart beats. The second ("bottom") number is called the diastolic pressure. It is a measure of the pressure in your arteries as the heart relaxes. What are the causes? The exact cause of this condition is not known. There are some conditions  that result in or are related to high blood pressure. What increases the risk? Some risk factors for high blood pressure are under your control. The following factors may make you more likely to develop this condition:  Smoking.  Having type 2 diabetes mellitus, high cholesterol, or both.  Not getting enough exercise or physical activity.  Being overweight.  Having too much fat, sugar, calories, or salt (sodium) in your diet.  Drinking too much alcohol. Some risk factors for high blood pressure may be difficult or impossible to change. Some of these factors include:  Having chronic kidney disease.  Having a family history of high blood pressure.  Age. Risk increases with age.  Race. You may be at higher risk if you are African American.  Gender. Men are at higher risk than women before age 45. After age 65, women are at higher risk than men.  Having obstructive sleep apnea.  Stress. What are the signs or symptoms? High blood pressure may not cause symptoms. Very high blood pressure (hypertensive crisis) may cause:  Headache.  Anxiety.  Shortness of breath.  Nosebleed.  Nausea and vomiting.  Vision changes.  Severe chest pain.  Seizures. How is this diagnosed? This condition is diagnosed by measuring your blood pressure while you are seated, with your arm resting on a flat surface, your legs uncrossed, and your feet flat on the floor. The cuff of the blood pressure monitor will be placed directly against the skin of your upper arm at the level of your   heart. It should be measured at least twice using the same arm. Certain conditions can cause a difference in blood pressure between your right and left arms. Certain factors can cause blood pressure readings to be lower or higher than normal for a short period of time:  When your blood pressure is higher when you are in a health care provider's office than when you are at home, this is called white coat hypertension.  Most people with this condition do not need medicines.  When your blood pressure is higher at home than when you are in a health care provider's office, this is called masked hypertension. Most people with this condition may need medicines to control blood pressure. If you have a high blood pressure reading during one visit or you have normal blood pressure with other risk factors, you may be asked to:  Return on a different day to have your blood pressure checked again.  Monitor your blood pressure at home for 1 week or longer. If you are diagnosed with hypertension, you may have other blood or imaging tests to help your health care provider understand your overall risk for other conditions. How is this treated? This condition is treated by making healthy lifestyle changes, such as eating healthy foods, exercising more, and reducing your alcohol intake. Your health care provider may prescribe medicine if lifestyle changes are not enough to get your blood pressure under control, and if:  Your systolic blood pressure is above 130.  Your diastolic blood pressure is above 80. Your personal target blood pressure may vary depending on your medical conditions, your age, and other factors. Follow these instructions at home: Eating and drinking   Eat a diet that is high in fiber and potassium, and low in sodium, added sugar, and fat. An example eating plan is called the DASH (Dietary Approaches to Stop Hypertension) diet. To eat this way: ? Eat plenty of fresh fruits and vegetables. Try to fill one half of your plate at each meal with fruits and vegetables. ? Eat whole grains, such as whole-wheat pasta, brown rice, or whole-grain bread. Fill about one fourth of your plate with whole grains. ? Eat or drink low-fat dairy products, such as skim milk or low-fat yogurt. ? Avoid fatty cuts of meat, processed or cured meats, and poultry with skin. Fill about one fourth of your plate with lean proteins, such  as fish, chicken without skin, beans, eggs, or tofu. ? Avoid pre-made and processed foods. These tend to be higher in sodium, added sugar, and fat.  Reduce your daily sodium intake. Most people with hypertension should eat less than 1,500 mg of sodium a day.  Do not drink alcohol if: ? Your health care provider tells you not to drink. ? You are pregnant, may be pregnant, or are planning to become pregnant.  If you drink alcohol: ? Limit how much you use to:  0-1 drink a day for women.  0-2 drinks a day for men. ? Be aware of how much alcohol is in your drink. In the [place] one drink equals one 12 oz bottle of beer (355 mL), one 5 oz glass of wine (148 mL), or one 1 oz glass of hard liquor (44 mL). Lifestyle   Work with your health care provider to maintain a healthy body weight or to lose weight. Ask what an ideal weight is for you.  Get at least 30 minutes of exercise most days of the week. Activities may include walking, swimming,   or biking.  Include exercise to strengthen your muscles (resistance exercise), such as Pilates or lifting weights, as part of your weekly exercise routine. Try to do these types of exercises for 30 minutes at least 3 days a week.  Do not use any products that contain nicotine or tobacco, such as cigarettes, e-cigarettes, and chewing tobacco. If you need help quitting, ask your health care provider.  Monitor your blood pressure at home as told by your health care provider.  Keep all follow-up visits as told by your health care provider. This is important. Medicines  Take over-the-counter and prescription medicines only as told by your health care provider. Follow directions carefully. Blood pressure medicines must be taken as prescribed.  Do not skip doses of blood pressure medicine. Doing this puts you at risk for problems and can make the medicine less effective.  Ask your health care provider about side effects or reactions to medicines that you  should watch for. Contact a health care provider if you:  Think you are having a reaction to a medicine you are taking.  Have headaches that keep coming back (recurring).  Feel dizzy.  Have swelling in your ankles.  Have trouble with your vision. Get help right away if you:  Develop a severe headache or confusion.  Have unusual weakness or numbness.  Feel faint.  Have severe pain in your chest or abdomen.  Vomit repeatedly.  Have trouble breathing. Summary  Hypertension is when the force of blood pumping through your arteries is too strong. If this condition is not controlled, it may put you at risk for serious complications.  Your personal target blood pressure may vary depending on your medical conditions, your age, and other factors. For most people, a normal blood pressure is less than 120/80.  Hypertension is treated with lifestyle changes, medicines, or a combination of both. Lifestyle changes include losing weight, eating a healthy, low-sodium diet, exercising more, and limiting alcohol. This information is not intended to replace advice given to you by your health care provider. Make sure you discuss any questions you have with your health care provider. Document Revised: 04/25/2018 Document Reviewed: 04/25/2018 [employer] Patient Education  2020 [employer]  

## 2019-09-23 NOTE — Progress Notes (Signed)
BP Readings from Last 3 Encounters:  09/23/19 (!) 167/99  02/18/19 130/84  09/13/18 (!) 149/93   Wt Readings from Last 3 Encounters:  09/23/19 191 lb 6.4 oz (86.8 kg)  02/18/19 188 lb 6.4 oz (85.5 kg)  09/13/18 188 lb (85.3 kg)   Lab Results  Component Value Date   HGBA1C 6.6 (H) 02/18/2019   Jacob Chan 64 y.o.   Chief Complaint  Patient presents with  . Medical Management of Chronic Issues    Med check and refills. Patient bp was elevated but he states he did not take any of his med this am. Patient also would like a refill on a med he used for white spots on the skin  . urine problem    pt thinks he may have a kidney stone    HISTORY OF PRESENT ILLNESS: This is a 64 y.o. male with chronic medical problems here for follow-up. 1.  Hypertension: On amlodipine 5 mg daily and lisinopril 10 mg daily. 2.  Diabetes type 2: On Metformin 500 mg once a day. 3.  Dyslipidemia: On rosuvastatin 5 mg daily. Ran out of medications.  Needs refills.  Did not take medications this morning. 4.  History of kidney stones and thinks he may have had one recently. No other complaints or medical concerns today.  HPI   Prior to Admission medications   Medication Sig Start Date End Date Taking? Authorizing Provider  acetaminophen (TYLENOL) 500 MG tablet Take 1,000 mg by mouth every 6 (six) hours as needed. PAIN   Yes [provider]  amLODipine (NORVASC) 5 MG tablet Take 1 tablet (5 mg total) by mouth every morning. 09/23/19 12/22/19 Yes Ott Zimmerle, Ines Bloomer, MD  ascorbic acid (VITAMIN C) 500 MG tablet Take 500 mg by mouth daily.   Yes [provider]  aspirin EC 81 MG tablet Take 81 mg by mouth daily.   Yes [provider]  diclofenac sodium (VOLTAREN) 1 % GEL Apply 2 g topically 2 (two) times daily. 02/18/19  Yes Horald Pollen, MD  metFORMIN (GLUCOPHAGE-XR) 500 MG 24 hr tablet TAKE 1 TABLET WITH BREAKFAST 09/23/19  Yes Jalie Eiland, Ines Bloomer, MD  Multiple  Vitamins-Minerals (CENTRUM SILVER PO) Take 1 tablet by mouth daily.   Yes [provider]  sildenafil (REVATIO) 20 MG tablet TAKE 2 TABLETS BY MOUTH APPROXIMATELY 30 TO 60 MINUTES BEFORE INTERCOURSE. 09/19/19  Yes Kavon Valenza, Ines Bloomer, MD  lisinopril (ZESTRIL) 20 MG tablet Take 1 tablet (20 mg total) by mouth daily. 09/23/19   Horald Pollen, MD  rosuvastatin (CRESTOR) 5 MG tablet Take 1 tablet (5 mg total) by mouth daily. 09/23/19 12/22/19  Horald Pollen, MD    No Known Allergies  Patient Active Problem List   Diagnosis Date Noted  . Hypertension associated with diabetes ([place]) 12/20/2016  . Dyslipidemia associated with type 2 diabetes mellitus ([place]) 12/20/2016    Past Medical History:  Diagnosis Date  . Chronic kidney disease   . Diabetes mellitus   . Hypertension     Past Surgical History:  Procedure Laterality Date  . LITHOTRIPSY     3 yrs ago    Social History   Socioeconomic History  . Marital status: Married    Spouse name: [name]  . Number of children: 4  . Years of education: Not on file  . Highest education level: Not on file  Occupational History  . Not on file  Tobacco Use  . Smoking status: Never Smoker  .  Smokeless tobacco: Never Used  Substance and Sexual Activity  . Alcohol use: No  . Drug use: No  . Sexual activity: Yes  Other Topics Concern  . Not on file  Social History Narrative  . Not on file   Social Determinants of Health   Financial Resource Strain:   . Difficulty of Paying Living Expenses: Not on file  Food Insecurity:   . Worried About Charity fundraiser in the Last Year: Not on file  . Ran Out of Food in the Last Year: Not on file  Transportation Needs:   . Lack of Transportation (Medical): Not on file  . Lack of Transportation (Non-Medical): Not on file  Physical Activity:   . Days of Exercise per Week: Not on file  . Minutes of Exercise per Session: Not on file  Stress:   . Feeling of Stress : Not on file    Social Connections:   . Frequency of Communication with Friends and Family: Not on file  . Frequency of Social Gatherings with Friends and Family: Not on file  . Attends Religious Services: Not on file  . Active Member of Clubs or Organizations: Not on file  . Attends Archivist Meetings: Not on file  . Marital Status: Not on file  Intimate Partner Violence:   . Fear of Current or Ex-Partner: Not on file  . Emotionally Abused: Not on file  . Physically Abused: Not on file  . Sexually Abused: Not on file    Family History  Problem Relation Age of Onset  . Diabetes Mother   . Heart disease Mother   . Diabetes Father      Review of Systems  Constitutional: Negative.  Negative for chills and fever.  HENT: Negative.  Negative for congestion and sore throat.   Respiratory: Negative.  Negative for cough and shortness of breath.   Cardiovascular: Negative.  Negative for chest pain and palpitations.  Gastrointestinal: Negative.  Negative for abdominal pain, blood in stool, diarrhea, melena, nausea and vomiting.  Genitourinary: Negative.  Negative for dysuria, flank pain and hematuria.  Musculoskeletal: Negative.  Negative for back pain, myalgias and neck pain.  Skin: Negative.  Negative for rash.  Neurological: Negative.  Negative for dizziness and headaches.  All other systems reviewed and are negative.  Today's Vitals   09/23/19 1131 09/23/19 1135  BP: (!) 178/100 (!) 167/99  Pulse: 81   Temp: 97.7 F (36.5 C)   TempSrc: Temporal   SpO2: 99%   Weight: 191 lb 6.4 oz (86.8 kg)    Body mass index is 27.86 kg/m.   Physical Exam Vitals reviewed.  Constitutional:      Appearance: Normal appearance.  HENT:     Head: Normocephalic.  Eyes:     Extraocular Movements: Extraocular movements intact.     Conjunctiva/sclera: Conjunctivae normal.     Pupils: Pupils are equal, round, and reactive to light.  Cardiovascular:     Rate and Rhythm: Normal rate and regular  rhythm.     Pulses: Normal pulses.     Heart sounds: Normal heart sounds.  Pulmonary:     Effort: Pulmonary effort is normal.     Breath sounds: Normal breath sounds.  Abdominal:     Palpations: Abdomen is soft.     Tenderness: There is no abdominal tenderness.  Musculoskeletal:        General: Normal range of motion.     Cervical back: Normal range of motion and neck supple.  Skin:    General: Skin is warm.     Capillary Refill: Capillary refill takes less than 2 seconds.  Neurological:     General: No focal deficit present.     Mental Status: He is alert and oriented to person, place, and time.  Psychiatric:        Mood and Affect: Mood normal.        Behavior: Behavior normal.    Results for orders placed or performed in visit on 09/23/19 (from the past 24 hour(s))  POCT urinalysis dipstick     Status: Abnormal   Collection Time: 09/23/19 12:13 PM  Result Value Ref Range   Color, UA yellow yellow   Clarity, UA clear clear   Glucose, UA negative negative mg/dL   Bilirubin, UA negative negative   Ketones, POC UA small (15) (A) negative mg/dL   Spec Grav, UA 1.025 1.010 - 1.025   Blood, UA negative negative   pH, UA 6.5 5.0 - 8.0   Protein Ur, POC trace (A) negative mg/dL   Urobilinogen, UA 0.2 0.2 or 1.0 E.U./dL   Nitrite, UA Negative Negative   Leukocytes, UA Negative Negative   A total of 30 minutes was spent with the patient, greater than 50% of which was in counseling/coordination of care regarding chronic medical problems including hypertension, cardiovascular risks associated with this, need to take medication as prescribed, review of all medications and side effects, review of most recent blood work, review of most recent office visit notes, diet and nutrition, need for colon cancer screening and Cologuard test, prognosis and need for follow-up.   ASSESSMENT & PLAN: Carrel was seen today for medical management of chronic issues and urine problem.  Diagnoses and  all orders for this visit:  Hypertension associated with diabetes ([place]) -     Hemoglobin A1c -     Comprehensive metabolic panel -     metFORMIN (GLUCOPHAGE-XR) 500 MG 24 hr tablet; TAKE 1 TABLET WITH BREAKFAST -     lisinopril (ZESTRIL) 20 MG tablet; Take 1 tablet (20 mg total) by mouth daily.  Need for prophylactic vaccination and inoculation against influenza -     Flu Vaccine QUAD 6+ mos PF IM (Fluarix Quad PF)  Type 2 diabetes mellitus without complication, without long-term current use of insulin (HCC)  Essential hypertension -     amLODipine (NORVASC) 5 MG tablet; Take 1 tablet (5 mg total) by mouth every morning.  Hyperlipidemia, unspecified hyperlipidemia type -     rosuvastatin (CRESTOR) 5 MG tablet; Take 1 tablet (5 mg total) by mouth daily.  Kidney stone -     POCT urinalysis dipstick  Dyslipidemia associated with type 2 diabetes mellitus ([place]) -     Lipid Panel  Colon cancer screening -     Ambulatory referral to Gastroenterology -     Cologuard    Patient Instructions       If you have lab work done today you will be contacted with your lab results within the next 2 weeks.  If you have not heard from [place] then please contact us. The fastest way to get your results is to register for My Chart.   IF you received an x-ray today, you will receive an invoice from [facility]. Please contact [facility] at [phone] with questions or concerns regarding your invoice.   IF you received labwork today, you will receive an invoice from [place]. Please contact [facility] at [phone] with questions or concerns regarding your  invoice.   Our billing staff will not be able to assist you with questions regarding bills from these companies.  You will be contacted with the lab results as soon as they are available. The fastest way to get your results is to activate your My Chart account. Instructions are located on the last page of this paperwork. If you  have not heard from [place] regarding the results in 2 weeks, please contact this office.      Hypertension, Adult High blood pressure (hypertension) is when the force of blood pumping through the arteries is too strong. The arteries are the blood vessels that carry blood from the heart throughout the body. Hypertension forces the heart to work harder to pump blood and may cause arteries to become narrow or stiff. Untreated or uncontrolled hypertension can cause a heart attack, heart failure, a stroke, kidney disease, and other problems. A blood pressure reading consists of a higher number over a lower number. Ideally, your blood pressure should be below 120/80. The first ("top") number is called the systolic pressure. It is a measure of the pressure in your arteries as your heart beats. The second ("bottom") number is called the diastolic pressure. It is a measure of the pressure in your arteries as the heart relaxes. What are the causes? The exact cause of this condition is not known. There are some conditions that result in or are related to high blood pressure. What increases the risk? Some risk factors for high blood pressure are under your control. The following factors may make you more likely to develop this condition:  Smoking.  Having type 2 diabetes mellitus, high cholesterol, or both.  Not getting enough exercise or physical activity.  Being overweight.  Having too much fat, sugar, calories, or salt (sodium) in your diet.  Drinking too much alcohol. Some risk factors for high blood pressure may be difficult or impossible to change. Some of these factors include:  Having chronic kidney disease.  Having a family history of high blood pressure.  Age. Risk increases with age.  Race. You may be at higher risk if you are African American.  Gender. Men are at higher risk than women before age 85. After age 90, women are at higher risk than men.  Having obstructive sleep  apnea.  Stress. What are the signs or symptoms? High blood pressure may not cause symptoms. Very high blood pressure (hypertensive crisis) may cause:  Headache.  Anxiety.  Shortness of breath.  Nosebleed.  Nausea and vomiting.  Vision changes.  Severe chest pain.  Seizures. How is this diagnosed? This condition is diagnosed by measuring your blood pressure while you are seated, with your arm resting on a flat surface, your legs uncrossed, and your feet flat on the floor. The cuff of the blood pressure monitor will be placed directly against the skin of your upper arm at the level of your heart. It should be measured at least twice using the same arm. Certain conditions can cause a difference in blood pressure between your right and left arms. Certain factors can cause blood pressure readings to be lower or higher than normal for a short period of time:  When your blood pressure is higher when you are in a health care provider's office than when you are at home, this is called white coat hypertension. Most people with this condition do not need medicines.  When your blood pressure is higher at home than when you are in a health care  provider's office, this is called masked hypertension. Most people with this condition may need medicines to control blood pressure. If you have a high blood pressure reading during one visit or you have normal blood pressure with other risk factors, you may be asked to:  Return on a different day to have your blood pressure checked again.  Monitor your blood pressure at home for 1 week or longer. If you are diagnosed with hypertension, you may have other blood or imaging tests to help your health care provider understand your overall risk for other conditions. How is this treated? This condition is treated by making healthy lifestyle changes, such as eating healthy foods, exercising more, and reducing your alcohol intake. Your health care provider may  prescribe medicine if lifestyle changes are not enough to get your blood pressure under control, and if:  Your systolic blood pressure is above 130.  Your diastolic blood pressure is above 80. Your personal target blood pressure may vary depending on your medical conditions, your age, and other factors. Follow these instructions at home: Eating and drinking   Eat a diet that is high in fiber and potassium, and low in sodium, added sugar, and fat. An example eating plan is called the DASH (Dietary Approaches to Stop Hypertension) diet. To eat this way: ? Eat plenty of fresh fruits and vegetables. Try to fill one half of your plate at each meal with fruits and vegetables. ? Eat whole grains, such as whole-wheat pasta, brown rice, or whole-grain bread. Fill about one fourth of your plate with whole grains. ? Eat or drink low-fat dairy products, such as skim milk or low-fat yogurt. ? Avoid fatty cuts of meat, processed or cured meats, and poultry with skin. Fill about one fourth of your plate with lean proteins, such as fish, chicken without skin, beans, eggs, or tofu. ? Avoid pre-made and processed foods. These tend to be higher in sodium, added sugar, and fat.  Reduce your daily sodium intake. Most people with hypertension should eat less than 1,500 mg of sodium a day.  Do not drink alcohol if: ? Your health care provider tells you not to drink. ? You are pregnant, may be pregnant, or are planning to become pregnant.  If you drink alcohol: ? Limit how much you use to:  0-1 drink a day for women.  0-2 drinks a day for men. ? Be aware of how much alcohol is in your drink. In the [place] one drink equals one 12 oz bottle of beer (355 mL), one 5 oz glass of wine (148 mL), or one 1 oz glass of hard liquor (44 mL). Lifestyle   Work with your health care provider to maintain a healthy body weight or to lose weight. Ask what an ideal weight is for you.  Get at least 30 minutes of exercise  most days of the week. Activities may include walking, swimming, or biking.  Include exercise to strengthen your muscles (resistance exercise), such as Pilates or lifting weights, as part of your weekly exercise routine. Try to do these types of exercises for 30 minutes at least 3 days a week.  Do not use any products that contain nicotine or tobacco, such as cigarettes, e-cigarettes, and chewing tobacco. If you need help quitting, ask your health care provider.  Monitor your blood pressure at home as told by your health care provider.  Keep all follow-up visits as told by your health care provider. This is important. Medicines  Take over-the-counter  and prescription medicines only as told by your health care provider. Follow directions carefully. Blood pressure medicines must be taken as prescribed.  Do not skip doses of blood pressure medicine. Doing this puts you at risk for problems and can make the medicine less effective.  Ask your health care provider about side effects or reactions to medicines that you should watch for. Contact a health care provider if you:  Think you are having a reaction to a medicine you are taking.  Have headaches that keep coming back (recurring).  Feel dizzy.  Have swelling in your ankles.  Have trouble with your vision. Get help right away if you:  Develop a severe headache or confusion.  Have unusual weakness or numbness.  Feel faint.  Have severe pain in your chest or abdomen.  Vomit repeatedly.  Have trouble breathing. Summary  Hypertension is when the force of blood pumping through your arteries is too strong. If this condition is not controlled, it may put you at risk for serious complications.  Your personal target blood pressure may vary depending on your medical conditions, your age, and other factors. For most people, a normal blood pressure is less than 120/80.  Hypertension is treated with lifestyle changes, medicines, or a  combination of both. Lifestyle changes include losing weight, eating a healthy, low-sodium diet, exercising more, and limiting alcohol. This information is not intended to replace advice given to you by your health care provider. Make sure you discuss any questions you have with your health care provider. Document Revised: 04/25/2018 Document Reviewed: 04/25/2018 Elsevier Patient Education  2020 Elsevier Inc.      Agustina Caroli, MD [facility]

## 2019-09-24 ENCOUNTER — Other Ambulatory Visit: Payer: Self-pay | Admitting: Emergency Medicine

## 2019-09-24 LAB — HEMOGLOBIN A1C
Est. average glucose Bld gHb Est-mCnc: 174 mg/dL
Hgb A1c MFr Bld: 7.7 % — ABNORMAL HIGH (ref 4.8–5.6)

## 2019-09-24 LAB — COMPREHENSIVE METABOLIC PANEL
ALT: 16 IU/L (ref 0–44)
AST: 24 IU/L (ref 0–40)
Albumin/Globulin Ratio: 2 (ref 1.2–2.2)
Albumin: 4.6 g/dL (ref 3.8–4.8)
Alkaline Phosphatase: 80 IU/L (ref 39–117)
BUN/Creatinine Ratio: 13 (ref 10–24)
BUN: 13 mg/dL (ref 8–27)
Bilirubin Total: 0.3 mg/dL (ref 0.0–1.2)
CO2: 22 mmol/L (ref 20–29)
Calcium: 9.6 mg/dL (ref 8.6–10.2)
Chloride: 104 mmol/L (ref 96–106)
Creatinine, Ser: 1.01 mg/dL (ref 0.76–1.27)
GFR calc Af Amer: 90 mL/min/{1.73_m2} (ref >59)
GFR calc non Af Amer: 78 mL/min/{1.73_m2} (ref >59)
Globulin, Total: 2.3 g/dL (ref 1.5–4.5)
Glucose: 109 mg/dL — ABNORMAL HIGH (ref 65–99)
Potassium: 4.4 mmol/L (ref 3.5–5.2)
Sodium: 142 mmol/L (ref 134–144)
Total Protein: 6.9 g/dL (ref 6.0–8.5)

## 2019-09-24 LAB — LIPID PANEL
Chol/HDL Ratio: 2.4 ratio (ref 0.0–5.0)
Cholesterol, Total: 151 mg/dL (ref 100–199)
HDL: 62 mg/dL (ref >39)
LDL Chol Calc (NIH): 73 mg/dL (ref 0–99)
Triglycerides: 82 mg/dL (ref 0–149)
VLDL Cholesterol Cal: 16 mg/dL (ref 5–40)

## 2019-09-24 MED ORDER — GLIPIZIDE 5 MG PO TABS: 5.0000 mg | ORAL_TABLET | Freq: Every day | ORAL | 3 refills | Status: DC

## 2019-09-24 NOTE — Telephone Encounter (Signed)
Spoke to Dr Nolon Rod and she ordered a Diagnostic Mammogram to [facility]

## 2019-10-24 ENCOUNTER — Encounter: Payer: Self-pay | Admitting: Gastroenterology

## 2019-11-03 ENCOUNTER — Ambulatory Visit: Payer: 59 | Attending: Internal Medicine

## 2019-11-03 DIAGNOSIS — Z23 Encounter for immunization: Secondary | ICD-10-CM | POA: Insufficient documentation

## 2019-11-03 NOTE — Progress Notes (Signed)
   Covid-19 Vaccination Clinic  Name:  RADU MISIASZEK    MRN: [id number] DOB: Jan 23, 1956  11/03/2019  Mr. Snidow was observed post Covid-19 immunization for 15 minutes without incident. He was provided with Vaccine Information Sheet and instruction to access the V-Safe system.   Mr. Zambrano was instructed to call 911 with any severe reactions post vaccine: Marland Kitchen Difficulty breathing  . Swelling of face and throat  . A fast heartbeat  . A bad rash all over body  . Dizziness and weakness   Immunizations Administered    Name Date Dose VIS Date Route   Pfizer COVID-19 Vaccine 11/03/2019  2:51 PM 0.3 mL 08/09/2019 Intramuscular   Manufacturer: [place]   Lot: [id number]   [place]: [id number]

## 2019-11-08 ENCOUNTER — Telehealth: Payer: Self-pay | Admitting: Emergency Medicine

## 2019-11-08 NOTE — Telephone Encounter (Signed)
FYI to provider

## 2019-11-08 NOTE — Telephone Encounter (Signed)
[facility] called regarding pts positive cologard test please advise.

## 2019-11-08 NOTE — Telephone Encounter (Signed)
rec'd call from [place] @ [facility]  Reported that pt. Had a positive Cologuard.  Stated that the report was faxed to the office.  Advised will notify PCP office of results per phone, and send documentation.  Phone call to [facility].  Spoke with Clarise Cruz.  Advised of pt's. positive Cologuard.  Will send this note to office for PCP to review.  Clarise Cruz agreed with plan.

## 2019-11-09 ENCOUNTER — Other Ambulatory Visit: Payer: Self-pay | Admitting: Emergency Medicine

## 2019-11-09 DIAGNOSIS — R195 Other fecal abnormalities: Secondary | ICD-10-CM

## 2019-11-09 NOTE — Telephone Encounter (Signed)
Call patient please.  Positive Cologuard test.  Needs colonoscopy.  Referral placed today.  Thanks.

## 2019-11-11 ENCOUNTER — Telehealth: Payer: Self-pay | Admitting: Emergency Medicine

## 2019-11-11 NOTE — Telephone Encounter (Signed)
Pt called regarding his positive cologuard , he was made aware of the new referral and to be on the look out for the referral team to call him

## 2019-11-11 NOTE — Telephone Encounter (Signed)
Attempted to call pt no answer. Unable to leave message and will try again.

## 2019-11-12 ENCOUNTER — Encounter: Payer: Self-pay | Admitting: Radiology

## 2019-11-12 ENCOUNTER — Other Ambulatory Visit: Payer: Self-pay | Admitting: Emergency Medicine

## 2019-11-12 ENCOUNTER — Telehealth: Payer: Self-pay | Admitting: Emergency Medicine

## 2019-11-12 DIAGNOSIS — R195 Other fecal abnormalities: Secondary | ICD-10-CM

## 2019-11-12 LAB — COLOGUARD: Cologuard: POSITIVE — AB

## 2019-11-12 NOTE — Telephone Encounter (Signed)
Called pt again, lm message for cb

## 2019-11-12 NOTE — Telephone Encounter (Signed)
Pt called to return office phone call pt believes it was regarding his positive cologard test. [phone]. Please advise.

## 2019-11-13 NOTE — Telephone Encounter (Signed)
Attempted to call pt no answer. Unable to leave the message due to mailbox being full.

## 2019-11-13 NOTE — Telephone Encounter (Signed)
Pt returned call to office. Pt requests call back. °

## 2019-11-15 NOTE — Telephone Encounter (Signed)
Attempted to call no answer once again. Unable to leave message. Letter has been sent.

## 2019-11-15 NOTE — Telephone Encounter (Signed)
I have called the pt back and informed him that we have received the positive cologard, the reason for the colonoscopy is to get further evaluation. Once we have that information  then we will be able to see what the next steps are. Pt stated understanding.

## 2019-11-26 ENCOUNTER — Other Ambulatory Visit: Payer: Self-pay

## 2019-11-26 ENCOUNTER — Ambulatory Visit (AMBULATORY_SURGERY_CENTER): Payer: Self-pay | Admitting: *Deleted

## 2019-11-26 VITALS — Temp 96.5°F | Ht 69.5 in | Wt 192.0 lb

## 2019-11-26 DIAGNOSIS — R195 Other fecal abnormalities: Secondary | ICD-10-CM

## 2019-11-26 DIAGNOSIS — Z01818 Encounter for other preprocedural examination: Secondary | ICD-10-CM

## 2019-11-26 MED ORDER — SUPREP BOWEL PREP KIT 17.5-3.13-1.6 GM/177ML PO SOLN: 1.0000 | Freq: Once | ORAL | 0 refills | Status: AC

## 2019-11-26 NOTE — Progress Notes (Signed)
covid test 12-06-19 at 12:00  Pt is aware that care partner will wait in the car during procedure; if they feel like they will be too hot or cold to wait in the car; they may wait in the 4 th floor lobby. Patient is aware to bring only one care partner. We want them to wear a mask (we do not have any that we can provide them), practice social distancing, and we will check their temperatures when they get here.  I did remind the patient that their care partner needs to stay in the parking lot the entire time and have a cell phone available, we will call them when the pt is ready for discharge. Patient will wear mask into building.   No egg or soy allergy  No home oxygen use   No medications for weight loss taken  emmi information given  No trouble moving neck  suprep coupon given and code put into RX

## 2019-12-03 ENCOUNTER — Ambulatory Visit: Payer: 59

## 2019-12-04 ENCOUNTER — Ambulatory Visit: Payer: 59

## 2019-12-04 ENCOUNTER — Ambulatory Visit: Payer: 59 | Attending: Internal Medicine

## 2019-12-04 DIAGNOSIS — Z23 Encounter for immunization: Secondary | ICD-10-CM

## 2019-12-04 NOTE — Progress Notes (Signed)
   Covid-19 Vaccination Clinic  Name:  TAMIKA MOHN    MRN: [id number] DOB: May 19, 1956  12/04/2019  Mr. Despain was observed post Covid-19 immunization for 15 minutes without incident. He was provided with Vaccine Information Sheet and instruction to access the V-Safe system.   Mr. Stem was instructed to call 911 with any severe reactions post vaccine: Marland Kitchen Difficulty breathing  . Swelling of face and throat  . A fast heartbeat  . A bad rash all over body  . Dizziness and weakness   Immunizations Administered    Name Date Dose VIS Date Route   Pfizer COVID-19 Vaccine 12/04/2019  4:50 PM 0.3 mL 08/09/2019 Intramuscular   Manufacturer: [place]   Lot: [id number]   [place]: [id number]

## 2019-12-05 ENCOUNTER — Other Ambulatory Visit: Payer: Self-pay | Admitting: Gastroenterology

## 2019-12-06 ENCOUNTER — Ambulatory Visit (INDEPENDENT_AMBULATORY_CARE_PROVIDER_SITE_OTHER): Payer: 59

## 2019-12-06 ENCOUNTER — Encounter: Payer: Self-pay | Admitting: Gastroenterology

## 2019-12-06 DIAGNOSIS — Z1159 Encounter for screening for other viral diseases: Secondary | ICD-10-CM

## 2019-12-06 LAB — SARS CORONAVIRUS 2 (TAT 6-24 HRS): SARS Coronavirus 2: NEGATIVE

## 2019-12-10 ENCOUNTER — Ambulatory Visit (AMBULATORY_SURGERY_CENTER): Payer: 59 | Admitting: Gastroenterology

## 2019-12-10 ENCOUNTER — Encounter: Payer: Self-pay | Admitting: Gastroenterology

## 2019-12-10 ENCOUNTER — Other Ambulatory Visit: Payer: Self-pay

## 2019-12-10 VITALS — BP 92/62 | HR 84 | Temp 97.7°F | Resp 12 | Ht 69.5 in | Wt 192.0 lb

## 2019-12-10 DIAGNOSIS — K635 Polyp of colon: Secondary | ICD-10-CM

## 2019-12-10 DIAGNOSIS — K5730 Diverticulosis of large intestine without perforation or abscess without bleeding: Secondary | ICD-10-CM

## 2019-12-10 DIAGNOSIS — R195 Other fecal abnormalities: Secondary | ICD-10-CM | POA: Diagnosis not present

## 2019-12-10 DIAGNOSIS — D120 Benign neoplasm of cecum: Secondary | ICD-10-CM

## 2019-12-10 MED ORDER — SODIUM CHLORIDE 0.9 % IV SOLN: 500.0000 mL | Freq: Once | INTRAVENOUS | Status: DC

## 2019-12-10 NOTE — Progress Notes (Signed)
To PACU, VSS. Report to [email or web address] 

## 2019-12-10 NOTE — Patient Instructions (Signed)
Information on polyps and diverticulosis given to you today.  Await pathology results.    YOU HAD AN ENDOSCOPIC PROCEDURE TODAY AT [facility]:   Refer to the procedure report that was given to you for any specific questions about what was found during the examination.  If the procedure report does not answer your questions, please call your gastroenterologist to clarify.  If you requested that your care partner not be given the details of your procedure findings, then the procedure report has been included in a sealed envelope for you to review at your convenience later.  YOU SHOULD EXPECT: Some feelings of bloating in the abdomen. Passage of more gas than usual.  Walking can help get rid of the air that was put into your GI tract during the procedure and reduce the bloating. If you had a lower endoscopy (such as a colonoscopy or flexible sigmoidoscopy) you may notice spotting of blood in your stool or on the toilet paper. If you underwent a bowel prep for your procedure, you may not have a normal bowel movement for a few days.  Please Note:  You might notice some irritation and congestion in your nose or some drainage.  This is from the oxygen used during your procedure.  There is no need for concern and it should clear up in a day or so.  SYMPTOMS TO REPORT IMMEDIATELY:   Following lower endoscopy (colonoscopy or flexible sigmoidoscopy):  Excessive amounts of blood in the stool  Significant tenderness or worsening of abdominal pains  Swelling of the abdomen that is new, acute  Fever of 100F or higher    For urgent or emergent issues, a gastroenterologist can be reached at any hour by calling [phone]. Do not use MyChart messaging for urgent concerns.    DIET:  We do recommend a small meal at first, but then you may proceed to your regular diet.  Drink plenty of fluids but you should avoid alcoholic beverages for 24 hours.  ACTIVITY:  You should plan to take it  easy for the rest of today and you should NOT DRIVE or use heavy machinery until tomorrow (because of the sedation medicines used during the test).    FOLLOW UP: Our staff will call the number listed on your records 48-72 hours following your procedure to check on you and address any questions or concerns that you may have regarding the information given to you following your procedure. If we do not reach you, we will leave a message.  We will attempt to reach you two times.  During this call, we will ask if you have developed any symptoms of COVID 19. If you develop any symptoms (ie: fever, flu-like symptoms, shortness of breath, cough etc.) before then, please call [phone].  If you test positive for Covid 19 in the 2 weeks post procedure, please call and report this information to [place].    If any biopsies were taken you will be contacted by phone or by letter within the next 1-3 weeks.  Please call us at [phone] if you have not heard about the biopsies in 3 weeks.    SIGNATURES/CONFIDENTIALITY: You and/or your care partner have signed paperwork which will be entered into your electronic medical record.  These signatures attest to the fact that that the information above on your After Visit Summary has been reviewed and is understood.  Full responsibility of the confidentiality of this discharge information lies with you and/or your care-partner.

## 2019-12-10 NOTE — Progress Notes (Signed)
Called to room to assist during endoscopic procedure.  Patient ID and intended procedure confirmed with present staff. Received instructions for my participation in the procedure from the performing physician.  

## 2019-12-10 NOTE — Op Note (Signed)
[facility] Patient Name: Jacob Chan Procedure Date: 12/10/2019 2:58 PM MRN: [id number] Endoscopist: Holley. Loletha Carrow , MD Age: 64 Referring MD:  Date of Birth: [birth year] Gender: Male Account #: [id number] Procedure:                Colonoscopy Indications:              Positive Cologuard test Medicines:                Monitored Anesthesia Care Procedure:                Pre-Anesthesia Assessment:                           - Prior to the procedure, a History and Physical                            was performed, and patient medications and                            allergies were reviewed. The patient's tolerance of                            previous anesthesia was also reviewed. The risks                            and benefits of the procedure and the sedation                            options and risks were discussed with the patient.                            All questions were answered, and informed consent                            was obtained. Prior Anticoagulants: The patient has                            taken no previous anticoagulant or antiplatelet                            agents. ASA Grade Assessment: II - A patient with                            mild systemic disease. After reviewing the risks                            and benefits, the patient was deemed in                            satisfactory condition to undergo the procedure.                           After obtaining informed consent, the colonoscope  was passed under direct vision. Throughout the                            procedure, the patient's blood pressure, pulse, and                            oxygen saturations were monitored continuously. The                            Colonoscope was introduced through the anus and                            advanced to the the cecum, identified by                            appendiceal orifice and ileocecal valve. The                            colonoscopy was performed without difficulty. The                            patient tolerated the procedure well. The quality                            of the bowel preparation was good after lavage. The                            ileocecal valve, appendiceal orifice, and rectum                            were photographed. The bowel preparation used was                            SUPREP. Scope In: 3:34:54 PM Scope Out: 3:50:48 PM Scope Withdrawal Time: 0 hours 13 minutes 25 seconds  Total Procedure Duration: 0 hours 15 minutes 54 seconds  Findings:                 The perianal and digital rectal examinations were                            normal.                           Diverticula were found in the entire colon.                           A 8 mm polyp was found in the cecum. The polyp was                            flat with a mucus cap (see best under NBI). The                            polyp was removed with a cold snare. Resection and  retrieval were complete.                           The exam was otherwise without abnormality on                            direct and retroflexion views. Complications:            No immediate complications. Estimated Blood Loss:     Estimated blood loss was minimal. Impression:               - Diverticulosis in the entire examined colon.                           - One 8 mm polyp in the cecum, removed with a cold                            snare. Resected and retrieved.                           - The examination was otherwise normal on direct                            and retroflexion views. Recommendation:           - Patient has a contact number available for                            emergencies. The signs and symptoms of potential                            delayed complications were discussed with the                            patient. Return to normal activities tomorrow.                             Written discharge instructions were provided to the                            patient.                           - Resume previous diet.                           - Continue present medications.                           - Await pathology results.                           - Repeat colonoscopy is recommended for                            surveillance. The colonoscopy date will be  determined after pathology results from today's                            exam become available for review. Cigi Bega L. Loletha Carrow, MD 12/10/2019 4:03:49 PM This report has been signed electronically.

## 2019-12-10 NOTE — Progress Notes (Signed)
[facility]- [facility]  Pt's states no medical or surgical changes since previsit or office visit.

## 2019-12-12 ENCOUNTER — Telehealth: Payer: Self-pay | Admitting: *Deleted

## 2019-12-12 NOTE — Telephone Encounter (Signed)
  Follow up Call-  Call back number 12/10/2019  Post procedure Call Back phone  # [phone]  Permission to leave phone message Yes  Some recent data might be hidden     Patient questions:  Message left to call us if necessary.  Second call.

## 2019-12-12 NOTE — Telephone Encounter (Signed)
  Follow up Call-  Call back number 12/10/2019  Post procedure Call Back phone  # [phone]  Permission to leave phone message Yes  Some recent data might be hidden   [facility]

## 2019-12-16 ENCOUNTER — Encounter: Payer: Self-pay | Admitting: Gastroenterology

## 2019-12-25 ENCOUNTER — Telehealth: Payer: Self-pay | Admitting: Emergency Medicine
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
## 2019-12-26 ENCOUNTER — Telehealth: Payer: Self-pay | Admitting: Emergency Medicine

## 2019-12-26 NOTE — Telephone Encounter (Signed)
Called pt to set up labs for physical coming uo 01/17/2020  Jacob Chan was wanting to some on Fridays only he will check with provider on his 04/302021 appt to see how early he can come in

## 2019-12-27 ENCOUNTER — Other Ambulatory Visit: Payer: Self-pay | Admitting: Emergency Medicine

## 2019-12-27 DIAGNOSIS — N529 Male erectile dysfunction, unspecified: Secondary | ICD-10-CM

## 2019-12-27 NOTE — Telephone Encounter (Signed)
Attempted to call pt no answer so I left a message to call back.

## 2019-12-27 NOTE — Telephone Encounter (Signed)
Done

## 2019-12-27 NOTE — Telephone Encounter (Signed)
Patient is calling back  °

## 2019-12-27 NOTE — Telephone Encounter (Signed)
Requested Prescriptions  Pending Prescriptions Disp Refills  . sildenafil (REVATIO) 20 MG tablet [Pharmacy Med Name: SILDENAFIL 20 MG TABLET] 75 tablet 0    Sig: TAKE 2 TABLETS BY MOUTH APPROXIMATELY 30 TO 60 MINUTES BEFORE INTERCOURSE.     Urology: Erectile Dysfunction Agents Passed - 12/27/2019  4:04 PM      Passed - Last BP in normal range    BP Readings from Last 1 Encounters:  12/10/19 92/62         Passed - Valid encounter within last 12 months    Recent Outpatient Visits          3 months ago Hypertension associated with diabetes [facility])   Primary Care at [facility], Ines Bloomer, MD   10 months ago Essential hypertension   Primary Care at [facility], Ines Bloomer, MD   1 year ago Laryngitis   Primary Care at [place], Ines Bloomer, MD   1 year ago Sore throat   Primary Care at [facility], Carroll Sage, [place]   1 year ago [facility] separation, left, subsequent encounter   Primary Care at [facility], Reather Laurence, PA-C      Future Appointments            In 2 months Sagard[place], Ines Bloomer, MD Primary Care at [place], [facility]

## 2020-01-09 ENCOUNTER — Telehealth: Payer: Self-pay | Admitting: Emergency Medicine

## 2020-01-09 DIAGNOSIS — Z0000 Encounter for general adult medical examination without abnormal findings: Secondary | ICD-10-CM

## 2020-01-09 NOTE — Telephone Encounter (Signed)
Please order fasting labs for physical coming up 02/03/2020

## 2020-01-28 ENCOUNTER — Ambulatory Visit: Payer: 59

## 2020-01-29 ENCOUNTER — Other Ambulatory Visit: Payer: Self-pay

## 2020-01-29 ENCOUNTER — Ambulatory Visit (INDEPENDENT_AMBULATORY_CARE_PROVIDER_SITE_OTHER): Payer: 59 | Admitting: Emergency Medicine

## 2020-01-29 DIAGNOSIS — Z0000 Encounter for general adult medical examination without abnormal findings: Secondary | ICD-10-CM

## 2020-01-30 LAB — CMP14+EGFR
ALT: 20 IU/L (ref 0–44)
AST: 18 IU/L (ref 0–40)
Albumin/Globulin Ratio: 1.9 (ref 1.2–2.2)
Albumin: 4.4 g/dL (ref 3.8–4.8)
Alkaline Phosphatase: 66 IU/L (ref 48–121)
BUN/Creatinine Ratio: 12 (ref 10–24)
BUN: 15 mg/dL (ref 8–27)
Bilirubin Total: 0.2 mg/dL (ref 0.0–1.2)
CO2: 23 mmol/L (ref 20–29)
Calcium: 9.6 mg/dL (ref 8.6–10.2)
Chloride: 103 mmol/L (ref 96–106)
Creatinine, Ser: 1.23 mg/dL (ref 0.76–1.27)
GFR calc Af Amer: 71 mL/min/{1.73_m2} (ref >59)
GFR calc non Af Amer: 61 mL/min/{1.73_m2} (ref >59)
Globulin, Total: 2.3 g/dL (ref 1.5–4.5)
Glucose: 82 mg/dL (ref 65–99)
Potassium: 4.7 mmol/L (ref 3.5–5.2)
Sodium: 137 mmol/L (ref 134–144)
Total Protein: 6.7 g/dL (ref 6.0–8.5)

## 2020-01-30 LAB — HEMOGLOBIN A1C
Est. average glucose Bld gHb Est-mCnc: 148 mg/dL
Hgb A1c MFr Bld: 6.8 % — ABNORMAL HIGH (ref 4.8–5.6)

## 2020-01-30 LAB — CBC WITH DIFFERENTIAL/PLATELET
Basophils Absolute: 0.1 10*3/uL (ref 0.0–0.2)
Basos: 1 %
EOS (ABSOLUTE): 0.1 10*3/uL (ref 0.0–0.4)
Eos: 2 %
Hematocrit: 42.3 % (ref 37.5–51.0)
Hemoglobin: 14.2 g/dL (ref 13.0–17.7)
Immature Grans (Abs): 0 10*3/uL (ref 0.0–0.1)
Immature Granulocytes: 0 %
Lymphocytes Absolute: 1.4 10*3/uL (ref 0.7–3.1)
Lymphs: 27 %
MCH: 30.7 pg (ref 26.6–33.0)
MCHC: 33.6 g/dL (ref 31.5–35.7)
MCV: 92 fL (ref 79–97)
Monocytes Absolute: 0.5 10*3/uL (ref 0.1–0.9)
Monocytes: 10 %
Neutrophils Absolute: 2.9 10*3/uL (ref 1.4–7.0)
Neutrophils: 60 %
Platelets: 228 10*3/uL (ref 150–450)
RBC: 4.62 x10E6/uL (ref 4.14–5.80)
RDW: 12 % (ref 11.6–15.4)
WBC: 4.9 10*3/uL (ref 3.4–10.8)

## 2020-01-30 LAB — LIPID PANEL
Chol/HDL Ratio: 2.6 ratio (ref 0.0–5.0)
Cholesterol, Total: 153 mg/dL (ref 100–199)
HDL: 59 mg/dL (ref >39)
LDL Chol Calc (NIH): 80 mg/dL (ref 0–99)
Triglycerides: 72 mg/dL (ref 0–149)
VLDL Cholesterol Cal: 14 mg/dL (ref 5–40)

## 2020-02-03 ENCOUNTER — Encounter: Payer: Self-pay | Admitting: Emergency Medicine

## 2020-02-03 ENCOUNTER — Ambulatory Visit (INDEPENDENT_AMBULATORY_CARE_PROVIDER_SITE_OTHER): Payer: 59 | Admitting: Emergency Medicine

## 2020-02-03 ENCOUNTER — Other Ambulatory Visit: Payer: Self-pay

## 2020-02-03 VITALS — BP 144/90 | HR 77 | Temp 97.6°F | Ht 69.0 in | Wt 191.2 lb

## 2020-02-03 DIAGNOSIS — E1159 Type 2 diabetes mellitus with other circulatory complications: Secondary | ICD-10-CM | POA: Diagnosis not present

## 2020-02-03 DIAGNOSIS — E785 Hyperlipidemia, unspecified: Secondary | ICD-10-CM | POA: Diagnosis not present

## 2020-02-03 DIAGNOSIS — B360 Pityriasis versicolor: Secondary | ICD-10-CM

## 2020-02-03 DIAGNOSIS — Z0001 Encounter for general adult medical examination with abnormal findings: Secondary | ICD-10-CM | POA: Diagnosis not present

## 2020-02-03 DIAGNOSIS — R399 Unspecified symptoms and signs involving the genitourinary system: Secondary | ICD-10-CM

## 2020-02-03 DIAGNOSIS — I152 Hypertension secondary to endocrine disorders: Secondary | ICD-10-CM

## 2020-02-03 DIAGNOSIS — I10 Essential (primary) hypertension: Secondary | ICD-10-CM | POA: Diagnosis not present

## 2020-02-03 DIAGNOSIS — R490 Dysphonia: Secondary | ICD-10-CM

## 2020-02-03 DIAGNOSIS — R011 Cardiac murmur, unspecified: Secondary | ICD-10-CM

## 2020-02-03 DIAGNOSIS — E1169 Type 2 diabetes mellitus with other specified complication: Secondary | ICD-10-CM

## 2020-02-03 NOTE — Patient Instructions (Addendum)
   If you have lab work done today you will be contacted with your lab results within the next 2 weeks.  If you have not heard from us then please contact us. The fastest way to get your results is to register for My Chart.   IF you received an x-ray today, you will receive an invoice from [facility]. Please contact [facility] at [phone] with questions or concerns regarding your invoice.   IF you received labwork today, you will receive an invoice from [facility]. Please contact [facility] at [phone] with questions or concerns regarding your invoice.   Our billing staff will not be able to assist you with questions regarding bills from these companies.  You will be contacted with the lab results as soon as they are available. The fastest way to get your results is to activate your My Chart account. Instructions are located on the last page of this paperwork. If you have not heard from us regarding the results in 2 weeks, please contact this office.      Health Maintenance, Male Adopting a healthy lifestyle and getting preventive care are important in promoting health and wellness. Ask your health care provider about:  The right schedule for you to have regular tests and exams.  Things you can do on your own to prevent diseases and keep yourself healthy. What should I know about diet, weight, and exercise? Eat a healthy diet   Eat a diet that includes plenty of vegetables, fruits, low-fat dairy products, and lean protein.  Do not eat a lot of foods that are high in solid fats, added sugars, or sodium. Maintain a healthy weight Body mass index (BMI) is a measurement that can be used to identify possible weight problems. It estimates body fat based on height and weight. Your health care provider can help determine your BMI and help you achieve or maintain a healthy weight. Get regular exercise Get regular exercise. This is one of the most important things you  can do for your health. Most adults should:  Exercise for at least 150 minutes each week. The exercise should increase your heart rate and make you sweat (moderate-intensity exercise).  Do strengthening exercises at least twice a week. This is in addition to the moderate-intensity exercise.  Spend less time sitting. Even light physical activity can be beneficial. Watch cholesterol and blood lipids Have your blood tested for lipids and cholesterol at 65 years of age, then have this test every 5 years. You may need to have your cholesterol levels checked more often if:  Your lipid or cholesterol levels are high.  You are older than 65 years of age.  You are at high risk for heart disease. What should I know about cancer screening? Many types of cancers can be detected early and may often be prevented. Depending on your health history and family history, you may need to have cancer screening at various ages. This may include screening for:  Colorectal cancer.  Prostate cancer.  Skin cancer.  Lung cancer. What should I know about heart disease, diabetes, and high blood pressure? Blood pressure and heart disease  High blood pressure causes heart disease and increases the risk of stroke. This is more likely to develop in people who have high blood pressure readings, are of African descent, or are overweight.  Talk with your health care provider about your target blood pressure readings.  Have your blood pressure checked: ? Every 3-5 years if you are 18-39   years of age. ? Every year if you are 40 years old or older.  If you are between the ages of 65 and 75 and are a current or former smoker, ask your health care provider if you should have a one-time screening for abdominal aortic aneurysm (AAA). Diabetes Have regular diabetes screenings. This checks your fasting blood sugar level. Have the screening done:  Once every three years after age 45 if you are at a normal weight and have  a low risk for diabetes.  More often and at a younger age if you are overweight or have a high risk for diabetes. What should I know about preventing infection? Hepatitis B If you have a higher risk for hepatitis B, you should be screened for this virus. Talk with your health care provider to find out if you are at risk for hepatitis B infection. Hepatitis C Blood testing is recommended for:  Everyone born from 1945 through 1965.  Anyone with known risk factors for hepatitis C. Sexually transmitted infections (STIs)  You should be screened each year for STIs, including gonorrhea and chlamydia, if: ? You are sexually active and are younger than 65 years of age. ? You are older than 65 years of age and your health care provider tells you that you are at risk for this type of infection. ? Your sexual activity has changed since you were last screened, and you are at increased risk for chlamydia or gonorrhea. Ask your health care provider if you are at risk.  Ask your health care provider about whether you are at high risk for HIV. Your health care provider may recommend a prescription medicine to help prevent HIV infection. If you choose to take medicine to prevent HIV, you should first get tested for HIV. You should then be tested every 3 months for as long as you are taking the medicine. Follow these instructions at home: Lifestyle  Do not use any products that contain nicotine or tobacco, such as cigarettes, e-cigarettes, and chewing tobacco. If you need help quitting, ask your health care provider.  Do not use street drugs.  Do not share needles.  Ask your health care provider for help if you need support or information about quitting drugs. Alcohol use  Do not drink alcohol if your health care provider tells you not to drink.  If you drink alcohol: ? Limit how much you have to 0-2 drinks a day. ? Be aware of how much alcohol is in your drink. In the [place] one drink equals one 12  oz bottle of beer (355 mL), one 5 oz glass of wine (148 mL), or one 1 oz glass of hard liquor (44 mL). General instructions  Schedule regular health, dental, and eye exams.  Stay current with your vaccines.  Tell your health care provider if: ? You often feel depressed. ? You have ever been abused or do not feel safe at home. Summary  Adopting a healthy lifestyle and getting preventive care are important in promoting health and wellness.  Follow your health care provider's instructions about healthy diet, exercising, and getting tested or screened for diseases.  Follow your health care provider's instructions on monitoring your cholesterol and blood pressure. This information is not intended to replace advice given to you by your health care provider. Make sure you discuss any questions you have with your health care provider. Document Revised: 08/08/2018 Document Reviewed: 08/08/2018 [employer]vier Patient Education  2020 [employer]  

## 2020-02-03 NOTE — Progress Notes (Signed)
Jacob Chan 64 y.o.   Chief Complaint  Patient presents with  . Annual Exam    CPE   . Rash    back, shoulder and arms.     HISTORY OF PRESENT ILLNESS: This is a 64 y.o. male here for his annual exam.  Has the following problems: 1.  Diabetes: On Glucophage XR 500 mg daily and glipizide 5 mg daily.  On statin therapy with Crestor 5 mg daily. Lab Results  Component Value Date   HGBA1C 6.8 (H) 01/29/2020   2.  Hypertension: On amlodipine 5 mg and lisinopril 20 mg daily.  Did not take medications today. BP Readings from Last 3 Encounters:  02/03/20 (!) 163/106  12/10/19 92/62  09/23/19 (!) 167/99   Wt Readings from Last 3 Encounters:  02/03/20 191 lb 3.2 oz (86.7 kg)  12/10/19 192 lb (87.1 kg)  11/26/19 192 lb (87.1 kg)  Has chronic rash to torso and upper extremities. Chronic hoarseness.  Needs ENT evaluation. Some urinary symptoms, LUTS, needs urology evaluation. No other complaints or medical concerns today.  HPI   Prior to Admission medications   Medication Sig Start Date End Date Taking? Authorizing Provider  acetaminophen (TYLENOL) 500 MG tablet Take 1,000 mg by mouth every 6 (six) hours as needed. PAIN   Yes Jacob Chan  amLODipine (NORVASC) 5 MG tablet Take 1 tablet (5 mg total) by mouth every morning. 09/23/19 02/03/20 Yes Jacob Chan, Jacob Bloomer, Chan  ascorbic acid (VITAMIN C) 500 MG tablet Take 500 mg by mouth daily.   Yes Jacob Chan  aspirin EC 81 MG tablet Take 81 mg by mouth daily.   Yes Jacob Chan  diclofenac sodium (VOLTAREN) 1 % GEL Apply 2 g topically 2 (two) times daily. 02/18/19  Yes Jacob Chan, Jacob Bloomer, Chan  glipiZIDE (GLUCOTROL) 5 MG tablet Take 1 tablet (5 mg total) by mouth daily with breakfast. 09/24/19 02/03/20 Yes Kobee Medlen, Jacob Bloomer, Chan  lisinopril (ZESTRIL) 20 MG tablet Take 1 tablet (20 mg total) by mouth daily. 09/23/19  Yes Jacob Pollen, Chan  metFORMIN (GLUCOPHAGE-XR) 500 MG 24 hr tablet TAKE 1  TABLET WITH BREAKFAST 09/23/19  Yes Jacob Chan, Jacob Bloomer, Chan  Multiple Vitamins-Minerals (CENTRUM SILVER PO) Take 1 tablet by mouth daily.   Yes Jacob Chan  rosuvastatin (CRESTOR) 5 MG tablet Take 1 tablet (5 mg total) by mouth daily. 09/23/19 02/03/20 Yes Jacob Chan, Jacob Bloomer, Chan  sildenafil (REVATIO) 20 MG tablet TAKE 2 TABLETS BY MOUTH APPROXIMATELY 30 TO 60 MINUTES BEFORE INTERCOURSE. 12/27/19  Yes Jacob Chan, Jacob Bloomer, Chan    No Known Allergies  Patient Active Problem List   Diagnosis Date Noted  . Hypertension associated with diabetes ([place]) 12/20/2016  . Dyslipidemia associated with type 2 diabetes mellitus ([place]) 12/20/2016    Past Medical History:  Diagnosis Date  . Chronic kidney disease    many kidney stones  . Diabetes mellitus   . Hypertension     Past Surgical History:  Procedure Laterality Date  . LITHOTRIPSY     3 yrs ago    Social History   Socioeconomic History  . Marital status: Married    Spouse name: Jacob Chan  . Number of children: 4  . Years of education: Not on file  . Highest education level: Not on file  Occupational History  . Not on file  Tobacco Use  . Smoking status: Never Smoker  . Smokeless tobacco: Never Used  Substance and Sexual Activity  . Alcohol  use: No  . Drug use: No  . Sexual activity: Yes  Other Topics Concern  . Not on file  Social History Narrative  . Not on file   Social Determinants of Health   Financial Resource Strain:   . Difficulty of Paying Living Expenses:   Food Insecurity:   . Worried About Charity fundraiser in the Last Year:   . Arboriculturist in the Last Year:   Transportation Needs:   . Film/video editor (Medical):   Marland Kitchen Lack of Transportation (Non-Medical):   Physical Activity:   . Days of Exercise per Week:   . Minutes of Exercise per Session:   Stress:   . Feeling of Stress :   Social Connections:   . Frequency of Communication with Friends and Family:   . Frequency of Social  Gatherings with Friends and Family:   . Attends Religious Services:   . Active Member of Clubs or Organizations:   . Attends Archivist Meetings:   Marland Kitchen Marital Status:   Intimate Partner Violence:   . Fear of Current or Ex-Partner:   . Emotionally Abused:   Marland Kitchen Physically Abused:   . Sexually Abused:     Family History  Problem Relation Age of Onset  . Diabetes Mother   . Heart disease Mother   . Diabetes Father   . Colon cancer Neg Hx   . Esophageal cancer Neg Hx   . Stomach cancer Neg Hx   . Rectal cancer Neg Hx      Review of Systems  Constitutional: Negative.  Negative for chills and fever.  HENT: Negative.  Negative for congestion and sore throat.        Chronic hoarseness  Eyes: Negative.   Respiratory: Negative.  Negative for cough and shortness of breath.   Cardiovascular: Negative.  Negative for chest pain and palpitations.  Gastrointestinal: Negative.  Negative for abdominal pain, blood in stool, constipation, diarrhea, nausea and vomiting.  Genitourinary: Positive for frequency and urgency. Negative for dysuria, flank pain and hematuria.  Musculoskeletal: Negative.  Negative for myalgias and neck pain.  Skin: Positive for rash.  Neurological: Negative.  Negative for dizziness and headaches.  All other systems reviewed and are negative.    Physical Exam Cardiovascular:     Rate and Rhythm: Normal rate and regular rhythm.     Pulses: Normal pulses.     Heart sounds: Murmur present. Systolic murmur present with a grade of 3/6.  Skin:    Findings: Rash present.     Comments: Tinea versicolor      ASSESSMENT & PLAN: Jacob Chan was seen today for annual exam and rash.  Diagnoses and all orders for this visit:  Encounter for general adult medical examination with abnormal findings  Hypertension associated with diabetes ([place])  Dyslipidemia associated with type 2 diabetes mellitus ([place])  Tinea versicolor  Heart murmur -     Ambulatory referral to  Cardiology  Lower urinary tract symptoms (LUTS) -     Ambulatory referral to Urology  Chronic hoarseness -     Ambulatory referral to ENT    Patient Instructions       If you have lab work done today you will be contacted with your lab results within the next 2 weeks.  If you have not heard from [place] then please contact us. The fastest way to get your results is to register for My Chart.   IF you received an x-ray today, you  will receive an invoice from [facility]. Please contact [facility] at [phone] with questions or concerns regarding your invoice.   IF you received labwork today, you will receive an invoice from [place]. Please contact [facility] at [phone] with questions or concerns regarding your invoice.   Our billing staff will not be able to assist you with questions regarding bills from these companies.  You will be contacted with the lab results as soon as they are available. The fastest way to get your results is to activate your My Chart account. Instructions are located on the last page of this paperwork. If you have not heard from [place] regarding the results in 2 weeks, please contact this office.       Health Maintenance, Male Adopting a healthy lifestyle and getting preventive care are important in promoting health and wellness. Ask your health care provider about:  The right schedule for you to have regular tests and exams.  Things you can do on your own to prevent diseases and keep yourself healthy. What should I know about diet, weight, and exercise? Eat a healthy diet   Eat a diet that includes plenty of vegetables, fruits, low-fat dairy products, and lean protein.  Do not eat a lot of foods that are high in solid fats, added sugars, or sodium. Maintain a healthy weight Body mass index (BMI) is a measurement that can be used to identify possible weight problems. It estimates body fat based on height and weight. Your health care  provider can help determine your BMI and help you achieve or maintain a healthy weight. Get regular exercise Get regular exercise. This is one of the most important things you can do for your health. Most adults should:  Exercise for at least 150 minutes each week. The exercise should increase your heart rate and make you sweat (moderate-intensity exercise).  Do strengthening exercises at least twice a week. This is in addition to the moderate-intensity exercise.  Spend less time sitting. Even light physical activity can be beneficial. Watch cholesterol and blood lipids Have your blood tested for lipids and cholesterol at 65 years of age, then have this test every 5 years. You may need to have your cholesterol levels checked more often if:  Your lipid or cholesterol levels are high.  You are older than 65 years of age.  You are at high risk for heart disease. What should I know about cancer screening? Many types of cancers can be detected early and may often be prevented. Depending on your health history and family history, you may need to have cancer screening at various ages. This may include screening for:  Colorectal cancer.  Prostate cancer.  Skin cancer.  Lung cancer. What should I know about heart disease, diabetes, and high blood pressure? Blood pressure and heart disease  High blood pressure causes heart disease and increases the risk of stroke. This is more likely to develop in people who have high blood pressure readings, are of African descent, or are overweight.  Talk with your health care provider about your target blood pressure readings.  Have your blood pressure checked: ? Every 3-5 years if you are 79-59 years of age. ? Every year if you are 38 years old or older.  If you are between the ages of 17 and 31 and are a current or former smoker, ask your health care provider if you should have a one-time screening for abdominal aortic aneurysm  (AAA). Diabetes Have regular diabetes screenings. This  checks your fasting blood sugar level. Have the screening done:  Once every three years after age 60 if you are at a normal weight and have a low risk for diabetes.  More often and at a younger age if you are overweight or have a high risk for diabetes. What should I know about preventing infection? Hepatitis B If you have a higher risk for hepatitis B, you should be screened for this virus. Talk with your health care provider to find out if you are at risk for hepatitis B infection. Hepatitis C Blood testing is recommended for:  Everyone born from [birth year] through 1966.  Anyone with known risk factors for hepatitis C. Sexually transmitted infections (STIs)  You should be screened each year for STIs, including gonorrhea and chlamydia, if: ? You are sexually active and are younger than 65 years of age. ? You are older than 65 years of age and your health care provider tells you that you are at risk for this type of infection. ? Your sexual activity has changed since you were last screened, and you are at increased risk for chlamydia or gonorrhea. Ask your health care provider if you are at risk.  Ask your health care provider about whether you are at high risk for HIV. Your health care provider may recommend a prescription medicine to help prevent HIV infection. If you choose to take medicine to prevent HIV, you should first get tested for HIV. You should then be tested every 3 months for as long as you are taking the medicine. Follow these instructions at home: Lifestyle  Do not use any products that contain nicotine or tobacco, such as cigarettes, e-cigarettes, and chewing tobacco. If you need help quitting, ask your health care provider.  Do not use street drugs.  Do not share needles.  Ask your health care provider for help if you need support or information about quitting drugs. Alcohol use  Do not drink alcohol if your health  care provider tells you not to drink.  If you drink alcohol: ? Limit how much you have to 0-2 drinks a day. ? Be aware of how much alcohol is in your drink. In the [place] one drink equals one 12 oz bottle of beer (355 mL), one 5 oz glass of wine (148 mL), or one 1 oz glass of hard liquor (44 mL). General instructions  Schedule regular health, dental, and eye exams.  Stay current with your vaccines.  Tell your health care provider if: ? You often feel depressed. ? You have ever been abused or do not feel safe at home. Summary  Adopting a healthy lifestyle and getting preventive care are important in promoting health and wellness.  Follow your health care provider's instructions about healthy diet, exercising, and getting tested or screened for diseases.  Follow your health care provider's instructions on monitoring your cholesterol and blood pressure. This information is not intended to replace advice given to you by your health care provider. Make sure you discuss any questions you have with your health care provider. Document Revised: 08/08/2018 Document Reviewed: 08/08/2018 Elsevier Patient Education  2020 Elsevier Inc.      Agustina Caroli, [facility]

## 2020-02-05 ENCOUNTER — Ambulatory Visit: Payer: 59

## 2020-02-26 ENCOUNTER — Ambulatory Visit: Payer: 59 | Admitting: Cardiovascular Disease
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
## 2020-02-28 ENCOUNTER — Other Ambulatory Visit: Payer: Self-pay

## 2020-02-28 ENCOUNTER — Encounter: Payer: Self-pay | Admitting: Cardiovascular Disease

## 2020-02-28 ENCOUNTER — Ambulatory Visit (INDEPENDENT_AMBULATORY_CARE_PROVIDER_SITE_OTHER): Payer: 59 | Admitting: Cardiovascular Disease

## 2020-02-28 VITALS — BP 130/82 | HR 87 | Temp 97.3°F | Ht 69.0 in | Wt 193.0 lb

## 2020-02-28 DIAGNOSIS — R011 Cardiac murmur, unspecified: Secondary | ICD-10-CM

## 2020-02-28 DIAGNOSIS — I10 Essential (primary) hypertension: Secondary | ICD-10-CM | POA: Diagnosis not present

## 2020-02-28 DIAGNOSIS — E782 Mixed hyperlipidemia: Secondary | ICD-10-CM

## 2020-02-28 NOTE — Patient Instructions (Signed)
Medication Instructions:  The current medical regimen is effective;  continue present plan and medications.  *If you need a refill on your cardiac medications before your next appointment, please call your pharmacy*   Testing/Procedures: Echocardiogram - Your physician has requested that you have an echocardiogram. Echocardiography is a painless test that uses sound waves to create images of your heart. It provides your doctor with information about the size and shape of your heart and how well your heart's chambers and valves are working. This procedure takes approximately one hour. There are no restrictions for this procedure. This will be performed at our [address] location - [address], [address].    Follow-Up: At [facility], you and your health needs are our priority.  As part of our continuing mission to provide you with exceptional heart care, we have created designated Provider Care Teams.  These Care Teams include your primary Cardiologist (physician) and Advanced Practice Providers (APPs -  Physician Assistants and Nurse Practitioners) who all work together to provide you with the care you need, when you need it.  We recommend signing up for the patient portal called "MyChart".  Sign up information is provided on this After Visit Summary.  MyChart is used to connect with patients for Virtual Visits (Telemedicine).  Patients are able to view lab/test results, encounter notes, upcoming appointments, etc.  Non-urgent messages can be sent to your provider as well.   To learn more about what you can do with MyChart, go to [email or web address].    Your next appointment:   12 month(s)  The format for your next appointment:   In Person  Provider:   Calvin O'Neal, MD    

## 2020-03-03 ENCOUNTER — Other Ambulatory Visit: Payer: Self-pay | Admitting: Emergency Medicine

## 2020-03-03 DIAGNOSIS — N529 Male erectile dysfunction, unspecified: Secondary | ICD-10-CM

## 2020-03-03 NOTE — Telephone Encounter (Signed)
Requested Prescriptions  Pending Prescriptions Disp Refills   sildenafil (REVATIO) 20 MG tablet [Pharmacy Med Name: SILDENAFIL 20 MG TABLET] 75 tablet 0    Sig: TAKE 2 TABLETS BY MOUTH APPROXIMATELY 30 TO 60 MINUTES BEFORE INTERCOURSE.     Urology: Erectile Dysfunction Agents Passed - 03/03/2020  6:31 PM      Passed - Last BP in normal range    BP Readings from Last 1 Encounters:  02/28/20 130/82         Passed - Valid encounter within last 12 months    Recent Outpatient Visits          4 weeks ago Encounter for general adult medical examination with abnormal findings   Primary Care at [facility], Ines Bloomer, MD   1 month ago Annual physical exam   Primary Care at [place], [place], MD   5 months ago Hypertension associated with diabetes [facility])   Primary Care at [facility], Ines Bloomer, MD   1 year ago Essential hypertension   Primary Care at [facility], Ines Bloomer, MD   1 year ago Laryngitis   Primary Care at [facility], Ines Bloomer, MD      Future Appointments            In 2 weeks Sagardia, Ines Bloomer, MD Primary Care at [place], [facility]   In [place] months [place], Ines Bloomer, MD Primary Care at [place], [facility]

## 2020-03-18 ENCOUNTER — Other Ambulatory Visit: Payer: Self-pay

## 2020-03-18 ENCOUNTER — Ambulatory Visit (INDEPENDENT_AMBULATORY_CARE_PROVIDER_SITE_OTHER): Payer: 59 | Admitting: Emergency Medicine

## 2020-03-18 ENCOUNTER — Encounter: Payer: Self-pay | Admitting: Emergency Medicine

## 2020-03-18 VITALS — BP 144/90 | HR 88 | Temp 98.0°F | Resp 16 | Ht 69.5 in | Wt 190.0 lb

## 2020-03-18 DIAGNOSIS — E1169 Type 2 diabetes mellitus with other specified complication: Secondary | ICD-10-CM

## 2020-03-18 DIAGNOSIS — B360 Pityriasis versicolor: Secondary | ICD-10-CM | POA: Diagnosis not present

## 2020-03-18 DIAGNOSIS — E785 Hyperlipidemia, unspecified: Secondary | ICD-10-CM

## 2020-03-18 DIAGNOSIS — E1159 Type 2 diabetes mellitus with other circulatory complications: Secondary | ICD-10-CM

## 2020-03-18 DIAGNOSIS — I10 Essential (primary) hypertension: Secondary | ICD-10-CM | POA: Diagnosis not present

## 2020-03-18 DIAGNOSIS — I152 Hypertension secondary to endocrine disorders: Secondary | ICD-10-CM

## 2020-03-18 MED ORDER — TERBINAFINE HCL 250 MG PO TABS: 250.0000 mg | ORAL_TABLET | Freq: Every day | ORAL | 0 refills | Status: AC

## 2020-03-18 NOTE — Progress Notes (Signed)
Jacob Chan 64 y.o.   Chief Complaint  Patient presents with  . Hypertension    follow up 6 month HTN with Diabetes    HISTORY OF PRESENT ILLNESS: This is a 64 y.o. male here for follow-up of hypertension and diabetes. Seen by me last month referred to cardiology for evaluation of heart murmur. Seen by cardiologist and scheduled for echocardiogram next Friday. Doing well.  No complaints or medical concerns at this point.  Compliant with medications. Lab Results  Component Value Date   HGBA1C 6.8 (H) 01/29/2020   BP Readings from Last 3 Encounters:  03/18/20 (!) 144/90  02/28/20 130/82  02/03/20 (!) 144/90   Lab Results  Component Value Date   CHOL 153 01/29/2020   HDL 59 01/29/2020   LDLCALC 80 01/29/2020   TRIG 72 01/29/2020   CHOLHDL 2.6 01/29/2020     HPI   Prior to Admission medications   Medication Sig Start Date End Date Taking? Authorizing Provider  acetaminophen (TYLENOL) 500 MG tablet Take 1,000 mg by mouth every 6 (six) hours as needed. PAIN   Yes [provider]  ascorbic acid (VITAMIN C) 500 MG tablet Take 500 mg by mouth daily.   Yes [provider]  aspirin EC 81 MG tablet Take 81 mg by mouth daily.   Yes [provider]  diclofenac sodium (VOLTAREN) 1 % GEL Apply 2 g topically 2 (two) times daily. 02/18/19  Yes Magaby Rumberger, Ines Bloomer, MD  lisinopril (ZESTRIL) 20 MG tablet Take 1 tablet (20 mg total) by mouth daily. 09/23/19  Yes Horald Pollen, MD  metFORMIN (GLUCOPHAGE-XR) 500 MG 24 hr tablet TAKE 1 TABLET WITH BREAKFAST 09/23/19  Yes Letti Towell, Ines Bloomer, MD  Multiple Vitamins-Minerals (CENTRUM SILVER PO) Take 1 tablet by mouth daily.   Yes [provider]  sildenafil (REVATIO) 20 MG tablet TAKE 2 TABLETS BY MOUTH APPROXIMATELY 30 TO 60 MINUTES BEFORE INTERCOURSE. 03/03/20  Yes Deisy Ozbun, Ines Bloomer, MD  amLODipine (NORVASC) 5 MG tablet Take 1 tablet (5 mg total) by mouth every morning. 09/23/19 02/03/20   Horald Pollen, MD  glipiZIDE (GLUCOTROL) 5 MG tablet Take 1 tablet (5 mg total) by mouth daily with breakfast. 09/24/19 02/03/20  Horald Pollen, MD  rosuvastatin (CRESTOR) 5 MG tablet Take 1 tablet (5 mg total) by mouth daily. 09/23/19 02/03/20  Horald Pollen, MD    No Known Allergies  Patient Active Problem List   Diagnosis Date Noted  . Hypertension associated with diabetes ([place]) 12/20/2016  . Dyslipidemia associated with type 2 diabetes mellitus ([place]) 12/20/2016    Past Medical History:  Diagnosis Date  . Chronic kidney disease    many kidney stones  . Diabetes mellitus   . Hypertension     Past Surgical History:  Procedure Laterality Date  . LITHOTRIPSY     3 yrs ago    Social History   Socioeconomic History  . Marital status: Married    Spouse name: [name]  . Number of children: 4  . Years of education: Not on file  . Highest education level: Not on file  Occupational History  . Not on file  Tobacco Use  . Smoking status: Never Smoker  . Smokeless tobacco: Never Used  Vaping Use  . Vaping Use: Never used  Substance and Sexual Activity  . Alcohol use: No  . Drug use: No  . Sexual activity: Yes  Other Topics Concern  . Not on file  Social History Narrative  .  Not on file   Social Determinants of Health   Financial Resource Strain:   . Difficulty of Paying Living Expenses:   Food Insecurity:   . Worried About Charity fundraiser in the Last Year:   . Arboriculturist in the Last Year:   Transportation Needs:   . Film/video editor (Medical):   Marland Kitchen Lack of Transportation (Non-Medical):   Physical Activity:   . Days of Exercise per Week:   . Minutes of Exercise per Session:   Stress:   . Feeling of Stress :   Social Connections:   . Frequency of Communication with Friends and Family:   . Frequency of Social Gatherings with Friends and Family:   . Attends Religious Services:   . Active Member of Clubs or Organizations:   . Attends  Archivist Meetings:   Marland Kitchen Marital Status:   Intimate Partner Violence:   . Fear of Current or Ex-Partner:   . Emotionally Abused:   Marland Kitchen Physically Abused:   . Sexually Abused:     Family History  Problem Relation Age of Onset  . Diabetes Mother   . Heart disease Mother   . Diabetes Father   . Colon cancer Neg Hx   . Esophageal cancer Neg Hx   . Stomach cancer Neg Hx   . Rectal cancer Neg Hx      Review of Systems  Constitutional: Negative.  Negative for chills and fever.  HENT: Negative.  Negative for congestion and sore throat.   Eyes: Negative.   Respiratory: Negative.  Negative for cough and shortness of breath.   Cardiovascular: Negative.  Negative for chest pain and palpitations.  Gastrointestinal: Negative.  Negative for abdominal pain, diarrhea, nausea and vomiting.  Genitourinary: Negative.  Negative for dysuria and hematuria.  Musculoskeletal: Negative.  Negative for myalgias and neck pain.  Skin: Positive for rash.  Neurological: Negative.  Negative for dizziness and headaches.  All other systems reviewed and are negative.  Vitals:   03/18/20 1627  BP: (!) 144/90  Pulse: 88  Resp: 16  Temp: 98 F (36.7 C)  SpO2: 98%     Physical Exam Vitals reviewed.  Constitutional:      Appearance: Normal appearance.  HENT:     Head: Normocephalic.  Eyes:     Extraocular Movements: Extraocular movements intact.     Pupils: Pupils are equal, round, and reactive to light.  Cardiovascular:     Rate and Rhythm: Normal rate and regular rhythm.  Pulmonary:     Effort: Pulmonary effort is normal.  Musculoskeletal:        General: Normal range of motion.     Cervical back: Normal range of motion.  Skin:    General: Skin is warm and dry.     Capillary Refill: Capillary refill takes less than 2 seconds.     Findings: Rash (Tinea versicolor) present.  Neurological:     General: No focal deficit present.     Mental Status: He is alert and oriented to person,  place, and time.  Psychiatric:        Mood and Affect: Mood normal.        Behavior: Behavior normal.      ASSESSMENT & PLAN: Jacob Chan was seen today for hypertension.  Diagnoses and all orders for this visit:  Hypertension associated with diabetes ([place])  Dyslipidemia associated with type 2 diabetes mellitus ([facility])  Tinea versicolor -     terbinafine (LAMISIL) 250 MG  tablet; Take 1 tablet (250 mg total) by mouth daily for 10 days.    Patient Instructions  Hypertension, Adult High blood pressure (hypertension) is when the force of blood pumping through the arteries is too strong. The arteries are the blood vessels that carry blood from the heart throughout the body. Hypertension forces the heart to work harder to pump blood and may cause arteries to become narrow or stiff. Untreated or uncontrolled hypertension can cause a heart attack, heart failure, a stroke, kidney disease, and other problems. A blood pressure reading consists of a higher number over a lower number. Ideally, your blood pressure should be below 120/80. The first ("top") number is called the systolic pressure. It is a measure of the pressure in your arteries as your heart beats. The second ("bottom") number is called the diastolic pressure. It is a measure of the pressure in your arteries as the heart relaxes. What are the causes? The exact cause of this condition is not known. There are some conditions that result in or are related to high blood pressure. What increases the risk? Some risk factors for high blood pressure are under your control. The following factors may make you more likely to develop this condition:  Smoking.  Having type 2 diabetes mellitus, high cholesterol, or both.  Not getting enough exercise or physical activity.  Being overweight.  Having too much fat, sugar, calories, or salt (sodium) in your diet.  Drinking too much alcohol. Some risk factors for high blood pressure may be difficult  or impossible to change. Some of these factors include:  Having chronic kidney disease.  Having a family history of high blood pressure.  Age. Risk increases with age.  Race. You may be at higher risk if you are African American.  Gender. Men are at higher risk than women before age 43. After age 61, women are at higher risk than men.  Having obstructive sleep apnea.  Stress. What are the signs or symptoms? High blood pressure may not cause symptoms. Very high blood pressure (hypertensive crisis) may cause:  Headache.  Anxiety.  Shortness of breath.  Nosebleed.  Nausea and vomiting.  Vision changes.  Severe chest pain.  Seizures. How is this diagnosed? This condition is diagnosed by measuring your blood pressure while you are seated, with your arm resting on a flat surface, your legs uncrossed, and your feet flat on the floor. The cuff of the blood pressure monitor will be placed directly against the skin of your upper arm at the level of your heart. It should be measured at least twice using the same arm. Certain conditions can cause a difference in blood pressure between your right and left arms. Certain factors can cause blood pressure readings to be lower or higher than normal for a short period of time:  When your blood pressure is higher when you are in a health care provider's office than when you are at home, this is called white coat hypertension. Most people with this condition do not need medicines.  When your blood pressure is higher at home than when you are in a health care provider's office, this is called masked hypertension. Most people with this condition may need medicines to control blood pressure. If you have a high blood pressure reading during one visit or you have normal blood pressure with other risk factors, you may be asked to:  Return on a different day to have your blood pressure checked again.  Monitor your blood pressure  at home for 1 week or  longer. If you are diagnosed with hypertension, you may have other blood or imaging tests to help your health care provider understand your overall risk for other conditions. How is this treated? This condition is treated by making healthy lifestyle changes, such as eating healthy foods, exercising more, and reducing your alcohol intake. Your health care provider may prescribe medicine if lifestyle changes are not enough to get your blood pressure under control, and if:  Your systolic blood pressure is above 130.  Your diastolic blood pressure is above 80. Your personal target blood pressure may vary depending on your medical conditions, your age, and other factors. Follow these instructions at home: Eating and drinking   Eat a diet that is high in fiber and potassium, and low in sodium, added sugar, and fat. An example eating plan is called the DASH (Dietary Approaches to Stop Hypertension) diet. To eat this way: ? Eat plenty of fresh fruits and vegetables. Try to fill one half of your plate at each meal with fruits and vegetables. ? Eat whole grains, such as whole-wheat pasta, brown rice, or whole-grain bread. Fill about one fourth of your plate with whole grains. ? Eat or drink low-fat dairy products, such as skim milk or low-fat yogurt. ? Avoid fatty cuts of meat, processed or cured meats, and poultry with skin. Fill about one fourth of your plate with lean proteins, such as fish, chicken without skin, beans, eggs, or tofu. ? Avoid pre-made and processed foods. These tend to be higher in sodium, added sugar, and fat.  Reduce your daily sodium intake. Most people with hypertension should eat less than 1,500 mg of sodium a day.  Do not drink alcohol if: ? Your health care provider tells you not to drink. ? You are pregnant, may be pregnant, or are planning to become pregnant.  If you drink alcohol: ? Limit how much you use to:  0-1 drink a day for women.  0-2 drinks a day for  men. ? Be aware of how much alcohol is in your drink. In the [place] one drink equals one 12 oz bottle of beer (355 mL), one 5 oz glass of wine (148 mL), or one 1 oz glass of hard liquor (44 mL). Lifestyle   Work with your health care provider to maintain a healthy body weight or to lose weight. Ask what an ideal weight is for you.  Get at least 30 minutes of exercise most days of the week. Activities may include walking, swimming, or biking.  Include exercise to strengthen your muscles (resistance exercise), such as Pilates or lifting weights, as part of your weekly exercise routine. Try to do these types of exercises for 30 minutes at least 3 days a week.  Do not use any products that contain nicotine or tobacco, such as cigarettes, e-cigarettes, and chewing tobacco. If you need help quitting, ask your health care provider.  Monitor your blood pressure at home as told by your health care provider.  Keep all follow-up visits as told by your health care provider. This is important. Medicines  Take over-the-counter and prescription medicines only as told by your health care provider. Follow directions carefully. Blood pressure medicines must be taken as prescribed.  Do not skip doses of blood pressure medicine. Doing this puts you at risk for problems and can make the medicine less effective.  Ask your health care provider about side effects or reactions to medicines that you should watch  for. Contact a health care provider if you:  Think you are having a reaction to a medicine you are taking.  Have headaches that keep coming back (recurring).  Feel dizzy.  Have swelling in your ankles.  Have trouble with your vision. Get help right away if you:  Develop a severe headache or confusion.  Have unusual weakness or numbness.  Feel faint.  Have severe pain in your chest or abdomen.  Vomit repeatedly.  Have trouble breathing. Summary  Hypertension is when the force of blood  pumping through your arteries is too strong. If this condition is not controlled, it may put you at risk for serious complications.  Your personal target blood pressure may vary depending on your medical conditions, your age, and other factors. For most people, a normal blood pressure is less than 120/80.  Hypertension is treated with lifestyle changes, medicines, or a combination of both. Lifestyle changes include losing weight, eating a healthy, low-sodium diet, exercising more, and limiting alcohol. This information is not intended to replace advice given to you by your health care provider. Make sure you discuss any questions you have with your health care provider. Document Revised: 04/25/2018 Document Reviewed: 04/25/2018 Elsevier Patient Education  2020 Elsevier Inc.      Agustina Caroli, MD [facility]

## 2020-03-18 NOTE — Patient Instructions (Signed)

## 2020-03-20 ENCOUNTER — Ambulatory Visit (HOSPITAL_COMMUNITY): Payer: 59 | Attending: Cardiology

## 2020-03-20 ENCOUNTER — Other Ambulatory Visit: Payer: Self-pay

## 2020-03-20 DIAGNOSIS — R011 Cardiac murmur, unspecified: Secondary | ICD-10-CM | POA: Diagnosis present

## 2020-03-20 LAB — ECHOCARDIOGRAM COMPLETE
AR max vel: 0.81 cm2
AV Area VTI: 0.8 cm2
AV Area mean vel: 0.75 cm2
AV Mean grad: 19 mmHg
AV Peak grad: 33.4 mmHg
Ao pk vel: 2.89 m/s
Area-P 1/2: 2.99 cm2
P 1/2 time: 676 msec
S' Lateral: 2.3 cm

## 2020-04-06 ENCOUNTER — Other Ambulatory Visit: Payer: Self-pay | Admitting: Emergency Medicine

## 2020-04-06 DIAGNOSIS — N529 Male erectile dysfunction, unspecified: Secondary | ICD-10-CM

## 2020-04-24 ENCOUNTER — Telehealth: Payer: Self-pay | Admitting: Emergency Medicine
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
## 2020-04-24 NOTE — Telephone Encounter (Signed)
Do we continue old Workers comp cases? Our office doesn't do workers comp, at least not for years. Please give some guidance.
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
## 2020-04-27 NOTE — Telephone Encounter (Signed)
Agree. We do not handle worker's comp cases. Thanks.

## 2020-04-27 NOTE — Telephone Encounter (Signed)
I have called pt and relayed that we no longer do workers comp on the voice mail and if he has any additional question he can call the office.   Plan: If pt calls back. Please relay the message from [place] below.

## 2020-04-27 NOTE — Telephone Encounter (Signed)
I have called the pt and there was no answer so I left a message to call back.

## 2020-04-28 NOTE — Telephone Encounter (Signed)
Pt called back and message was relayed

## 2020-05-07 ENCOUNTER — Other Ambulatory Visit: Payer: Self-pay | Admitting: Emergency Medicine

## 2020-05-07 DIAGNOSIS — N529 Male erectile dysfunction, unspecified: Secondary | ICD-10-CM

## 2020-09-09 ENCOUNTER — Telehealth (INDEPENDENT_AMBULATORY_CARE_PROVIDER_SITE_OTHER): Payer: BLUE CROSS/BLUE SHIELD | Admitting: Emergency Medicine

## 2020-09-09 ENCOUNTER — Other Ambulatory Visit: Payer: Self-pay

## 2020-09-09 VITALS — Ht 69.5 in | Wt 185.0 lb

## 2020-09-09 DIAGNOSIS — E1169 Type 2 diabetes mellitus with other specified complication: Secondary | ICD-10-CM | POA: Diagnosis not present

## 2020-09-09 DIAGNOSIS — E1159 Type 2 diabetes mellitus with other circulatory complications: Secondary | ICD-10-CM

## 2020-09-09 DIAGNOSIS — I152 Hypertension secondary to endocrine disorders: Secondary | ICD-10-CM | POA: Diagnosis not present

## 2020-09-09 DIAGNOSIS — U071 COVID-19: Secondary | ICD-10-CM

## 2020-09-09 DIAGNOSIS — E785 Hyperlipidemia, unspecified: Secondary | ICD-10-CM

## 2020-09-09 NOTE — Patient Instructions (Signed)
° ° ° °  If you have lab work done today you will be contacted with your lab results within the next 2 weeks.  If you have not heard from us then please contact us. The fastest way to get your results is to register for My Chart. ° ° °IF you received an x-ray today, you will receive an invoice from [facility]. Please contact [facility] at [phone] with questions or concerns regarding your invoice.  ° °IF you received labwork today, you will receive an invoice from [facility]. Please contact [facility] at [phone] with questions or concerns regarding your invoice.  ° °Our billing staff will not be able to assist you with questions regarding bills from these companies. ° °You will be contacted with the lab results as soon as they are available. The fastest way to get your results is to activate your My Chart account. Instructions are located on the last page of this paperwork. If you have not heard from us regarding the results in 2 weeks, please contact this office. °  ° ° ° °

## 2020-09-09 NOTE — Progress Notes (Signed)
Telemedicine Encounter- SOAP NOTE Established Patient Patient: Home  Provider: Office     This telephone encounter was conducted with the patient's (or proxy's) verbal consent via audio telecommunications: yes/no: Yes Patient was instructed to have this encounter in a suitably private space; and to only have persons present to whom they give permission to participate. In addition, patient identity was confirmed by use of name plus two identifiers (DOB and address).  I discussed the limitations, risks, security and privacy concerns of performing an evaluation and management service by telephone and the availability of in person appointments. I also discussed with the patient that there may be a patient responsible charge related to this service. The patient expressed understanding and agreed to proceed.  I spent a total of TIME; 0 MIN TO 60 MIN: 20 minutes talking with the patient or their proxy.  Chief Complaint  Patient presents with  . Cough    Per patient test positive Covid 09/07/2020 and wants a referral to infusion clinic. First Sxs was on 09/07/2020.  . Abdominal Pain  . Headache    Subjective   Jacob Chan is a 65 y.o. male established patient. Telephone visit today complaining of flulike symptoms that started 2 days ago. Tested positive for COVID.  Inquiring about referral to infusion clinic. Multiple flulike symptoms.  Has history of diabetes hypertension and dyslipidemia. Denies difficulty breathing, chest pain, nausea or vomiting.  HPI   Patient Active Problem List   Diagnosis Date Noted  . Hypertension associated with diabetes ([place]) 12/20/2016  . Dyslipidemia associated with type 2 diabetes mellitus ([place]) 12/20/2016    Past Medical History:  Diagnosis Date  . Chronic kidney disease    many kidney stones  . Diabetes mellitus   . Diabetes mellitus without complication ([place])    Phreesia 09/09/2020  . Hypertension     Current Outpatient Medications   Medication Sig Dispense Refill  . acetaminophen (TYLENOL) 500 MG tablet Take 1,000 mg by mouth every 6 (six) hours as needed. PAIN    . ascorbic acid (VITAMIN C) 500 MG tablet Take 500 mg by mouth daily.    Marland Kitchen aspirin EC 81 MG tablet Take 81 mg by mouth daily.    . diclofenac sodium (VOLTAREN) 1 % GEL Apply 2 g topically 2 (two) times daily. 100 g 0  . DM-APAP-CPM (CORICIDIN HBP PO) Take by mouth as needed.    Marland Kitchen lisinopril (ZESTRIL) 20 MG tablet Take 1 tablet (20 mg total) by mouth daily. 90 tablet 3  . metFORMIN (GLUCOPHAGE-XR) 500 MG 24 hr tablet TAKE 1 TABLET WITH BREAKFAST 90 tablet 3  . Multiple Vitamins-Minerals (CENTRUM SILVER PO) Take 1 tablet by mouth daily.    . sildenafil (REVATIO) 20 MG tablet TAKE 2 TABLETS APPROXIMATELY 30 TO 60 MINUTES BEFORE INTERCOURSE. 75 tablet 0  . amLODipine (NORVASC) 5 MG tablet Take 1 tablet (5 mg total) by mouth every morning. 90 tablet 3  . glipiZIDE (GLUCOTROL) 5 MG tablet Take 1 tablet (5 mg total) by mouth daily with breakfast. 90 tablet 3  . rosuvastatin (CRESTOR) 5 MG tablet Take 1 tablet (5 mg total) by mouth daily. 90 tablet 3   Current Facility-Administered Medications  Medication Dose Route Frequency Provider Last Rate Last Admin  . 0.9 %  sodium chloride infusion  500 mL Intravenous Once Doran Stabler, MD        No Known Allergies  Social History   Socioeconomic History  . Marital status: Married  Spouse name: [name]  . Number of children: 4  . Years of education: Not on file  . Highest education level: Not on file  Occupational History  . Not on file  Tobacco Use  . Smoking status: Never Smoker  . Smokeless tobacco: Never Used  Vaping Use  . Vaping Use: Never used  Substance and Sexual Activity  . Alcohol use: No  . Drug use: No  . Sexual activity: Yes  Other Topics Concern  . Not on file  Social History Narrative  . Not on file   Social Determinants of Health   Financial Resource Strain: Not on file  Food  Insecurity: Not on file  Transportation Needs: Not on file  Physical Activity: Not on file  Stress: Not on file  Social Connections: Not on file  Intimate Partner Violence: Not on file    Review of Systems  Constitutional: Positive for fever. Negative for chills.  HENT: Positive for congestion. Negative for sore throat.   Respiratory: Positive for cough. Negative for shortness of breath.   Cardiovascular: Negative.  Negative for chest pain and palpitations.  Gastrointestinal: Positive for abdominal pain, diarrhea and nausea. Negative for vomiting.  Musculoskeletal: Positive for myalgias.  Skin: Negative.  Negative for rash.  Neurological: Positive for headaches.  All other systems reviewed and are negative.   Objective   Vitals as reported by the patient: There were no vitals filed for this visit.  There are no diagnoses linked to this encounter. Herby was seen today for cough, abdominal pain and headache.  Diagnoses and all orders for this visit:  COVID-19 virus infection -     Ambulatory referral for Covid Treatment  Hypertension associated with diabetes ([place])  Dyslipidemia associated with type 2 diabetes mellitus ([place])  Clinically stable.  No red flag signs or symptoms.  Meets criteria for outpatient COVID treatment. Advised to contact the office if no better or worse during the next several days. COVID advice and ED precautions given.   I discussed the assessment and treatment plan with the patient. The patient was provided an opportunity to ask questions and all were answered. The patient agreed with the plan and demonstrated an understanding of the instructions.   The patient was advised to call back or seek an in-person evaluation if the symptoms worsen or if the condition fails to improve as anticipated.  I provided 20 minutes of non-face-to-face time during this encounter.  Horald Pollen, MD  Primary Care at [facility]

## 2020-09-14 ENCOUNTER — Ambulatory Visit (INDEPENDENT_AMBULATORY_CARE_PROVIDER_SITE_OTHER): Payer: BLUE CROSS/BLUE SHIELD

## 2020-09-14 ENCOUNTER — Encounter (HOSPITAL_COMMUNITY): Payer: Self-pay | Admitting: Emergency Medicine

## 2020-09-14 ENCOUNTER — Ambulatory Visit (HOSPITAL_COMMUNITY)
Admission: EM | Admit: 2020-09-14 | Discharge: 2020-09-14 | Disposition: A | Discharge status: Home / Self Care | Payer: BLUE CROSS/BLUE SHIELD | Attending: Urgent Care | Admitting: Urgent Care

## 2020-09-14 ENCOUNTER — Other Ambulatory Visit: Payer: Self-pay | Admitting: Registered Nurse

## 2020-09-14 ENCOUNTER — Other Ambulatory Visit: Payer: Self-pay

## 2020-09-14 DIAGNOSIS — R059 Cough, unspecified: Secondary | ICD-10-CM

## 2020-09-14 DIAGNOSIS — R0602 Shortness of breath: Secondary | ICD-10-CM

## 2020-09-14 DIAGNOSIS — U071 COVID-19: Secondary | ICD-10-CM

## 2020-09-14 DIAGNOSIS — E119 Type 2 diabetes mellitus without complications: Secondary | ICD-10-CM

## 2020-09-14 MED ORDER — CETIRIZINE HCL 10 MG PO TABS: 10.0000 mg | ORAL_TABLET | Freq: Every day | ORAL | 0 refills | Status: DC

## 2020-09-14 MED ORDER — PREDNISONE 10 MG PO TABS: 30.0000 mg | ORAL_TABLET | Freq: Every day | ORAL | 0 refills | Status: DC

## 2020-09-14 MED ORDER — PROMETHAZINE-DM 6.25-15 MG/5ML PO SYRP
5.0000 mL | ORAL_SOLUTION | Freq: Every evening | ORAL | 0 refills | Status: DC | PRN
Start: 2020-09-14 — End: 2021-08-17

## 2020-09-14 MED ORDER — BENZONATATE 100 MG PO CAPS: 100.0000 mg | ORAL_CAPSULE | Freq: Three times a day (TID) | ORAL | 0 refills | Status: DC | PRN

## 2020-09-14 NOTE — ED Triage Notes (Signed)
Pt presents with headache, SOB, cough, chills, and sweats xs 6 days. States tested positive for COVID 6 days ago.

## 2020-09-14 NOTE — ED Provider Notes (Signed)
[place]   MRN: [id number] DOB: 1955-12-01  Subjective:   Jacob Chan is a 65 y.o. male presenting for persistent shortness of breath, coughing, chills, sweats, intermittent chest discomfort while coughing.  Patient tested positive for COVID-19 09/08/2020.  He has been trying to get the infusion treatment but unfortunately they were unable to schedule him.  He has a history of well controlled diabetes.  Denies smoking history.  No respiratory or lung disorders.  He is COVID vaccinated.   Current Facility-Administered Medications:  .  0.9 %  sodium chloride infusion, 500 mL, Intravenous, Once, Danis, Estill Cotta III, MD  Current Outpatient Medications:  .  acetaminophen (TYLENOL) 500 MG tablet, Take 1,000 mg by mouth every 6 (six) hours as needed. PAIN, Disp: , Rfl:  .  amLODipine (NORVASC) 5 MG tablet, Take 1 tablet (5 mg total) by mouth every morning., Disp: 90 tablet, Rfl: 3 .  ascorbic acid (VITAMIN C) 500 MG tablet, Take 500 mg by mouth daily., Disp: , Rfl:  .  aspirin EC 81 MG tablet, Take 81 mg by mouth daily., Disp: , Rfl:  .  diclofenac sodium (VOLTAREN) 1 % GEL, Apply 2 g topically 2 (two) times daily., Disp: 100 g, Rfl: 0 .  DM-APAP-CPM (CORICIDIN HBP PO), Take by mouth as needed., Disp: , Rfl:  .  glipiZIDE (GLUCOTROL) 5 MG tablet, Take 1 tablet (5 mg total) by mouth daily with breakfast., Disp: 90 tablet, Rfl: 3 .  lisinopril (ZESTRIL) 20 MG tablet, Take 1 tablet (20 mg total) by mouth daily., Disp: 90 tablet, Rfl: 3 .  metFORMIN (GLUCOPHAGE-XR) 500 MG 24 hr tablet, TAKE 1 TABLET WITH BREAKFAST, Disp: 90 tablet, Rfl: 3 .  Multiple Vitamins-Minerals (CENTRUM SILVER PO), Take 1 tablet by mouth daily., Disp: , Rfl:  .  rosuvastatin (CRESTOR) 5 MG tablet, Take 1 tablet (5 mg total) by mouth daily., Disp: 90 tablet, Rfl: 3 .  sildenafil (REVATIO) 20 MG tablet, TAKE 2 TABLETS APPROXIMATELY 30 TO 60 MINUTES BEFORE INTERCOURSE., Disp: 75 tablet, Rfl: 0   No  Known Allergies  Past Medical History:  Diagnosis Date  . Chronic kidney disease    many kidney stones  . Diabetes mellitus   . Diabetes mellitus without complication ([place])    Phreesia 09/09/2020  . Hypertension      Past Surgical History:  Procedure Laterality Date  . LITHOTRIPSY     3 yrs ago    Family History  Problem Relation Age of Onset  . Diabetes Mother   . Heart disease Mother   . Diabetes Father   . Colon cancer Neg Hx   . Esophageal cancer Neg Hx   . Stomach cancer Neg Hx   . Rectal cancer Neg Hx     Social History   Tobacco Use  . Smoking status: Never Smoker  . Smokeless tobacco: Never Used  Vaping Use  . Vaping Use: Never used  Substance Use Topics  . Alcohol use: No  . Drug use: No    ROS   Objective:   Vitals: BP 131/84 (BP Location: Right Arm)   Pulse (!) 109   Temp 99.2 F (37.3 C) (Oral)   Resp 16   SpO2 100%   Physical Exam Constitutional:      General: He is not in acute distress.    Appearance: Normal appearance. He is well-developed. He is not ill-appearing, toxic-appearing or diaphoretic.  HENT:     Head: Normocephalic and atraumatic.  Right Ear: External ear normal.     Left Ear: External ear normal.     Nose: Nose normal.     Mouth/Throat:     Mouth: Mucous membranes are moist.     Pharynx: Oropharynx is clear.  Eyes:     General: No scleral icterus.    Extraocular Movements: Extraocular movements intact.     Pupils: Pupils are equal, round, and reactive to light.  Cardiovascular:     Rate and Rhythm: Normal rate and regular rhythm.     Heart sounds: Normal heart sounds. No murmur heard. No friction rub. No gallop.   Pulmonary:     Effort: Pulmonary effort is normal. No respiratory distress.     Breath sounds: Normal breath sounds. No stridor. No wheezing, rhonchi or rales.  Neurological:     Mental Status: He is alert and oriented to person, place, and time.  Psychiatric:        Mood and Affect: Mood normal.         Behavior: Behavior normal.        Thought Content: Thought content normal.     DG Chest 2 View  Result Date: 09/14/2020 CLINICAL DATA:  Cough and shortness of breath EXAM: CHEST - 2 VIEW COMPARISON:  October 20, 2009 FINDINGS: Lungs are clear. Heart size and pulmonary vascularity are normal. No adenopathy. There is slight anterior wedging of a midthoracic vertebral body. IMPRESSION: Lungs clear.  Cardiac silhouette normal. Electronically Signed   By: Lowella Grip III M.D.   On: 09/14/2020 15:53   Assessment and Plan :   PDMP not reviewed this encounter.  1. Cough   2. COVID-19   3. Well controlled diabetes mellitus ([place])     Chest x-ray reassuring, lung sounds are clear.  Recommended supportive care.  Requested aggressive management and we discussed the use of a steroid to help with his cough, shortness of breath.  Recommended strict diabetic diet.  Counseled on the possibility of chest PE but he has low risk factors apart from having COVID-19.  If he has no improvement in the next 48 hours, recommended he present to the emergency room for chest scan, further labs. Counseled patient on potential for adverse effects with medications prescribed/recommended today, ER and return-to-clinic precautions discussed, patient verbalized understanding.    Jaynee Eagles, PA-C 09/14/20 1610

## 2020-09-17 ENCOUNTER — Telehealth: Payer: Self-pay | Admitting: *Deleted

## 2020-09-17 ENCOUNTER — Telehealth: Payer: Self-pay | Admitting: Emergency Medicine

## 2020-09-17 NOTE — Telephone Encounter (Signed)
Spoke to patient because he requested a return to work note for tomorrow. Patient stated his Covid test was negative, I asked did he have any symptoms he stated a cough. I advised him he has to be asymptomatic for at least 5 days before returning to work. He stated he did not know that because his supervisor wanted him to come back tomorrow. Patient is taking medication for the cough, and I stressed to him again he has to be asymptomatic.

## 2020-09-17 NOTE — Telephone Encounter (Signed)
PATIENT NEEDS A RETURN TO WORK NOTE  HE HAS NEG COVID RESULTS NOW    PLEASE ADVISE    PLEASE CONTACT PATIENT WHEN READY FOR PICK UP

## 2020-09-18 NOTE — Telephone Encounter (Signed)
Printed pt is aware will pick up today

## 2020-09-18 NOTE — Telephone Encounter (Signed)
Pt called back saying he needs a note to be out a week Tuesday.  He said they are sending him FMLA  papers to his home to be completed.  He said they will need to be completed also by his doctor.  CB#  [phone]

## 2020-09-22 ENCOUNTER — Other Ambulatory Visit: Payer: Self-pay

## 2020-09-22 ENCOUNTER — Ambulatory Visit (HOSPITAL_COMMUNITY)
Admission: EM | Admit: 2020-09-22 | Discharge: 2020-09-22 | Disposition: A | Discharge status: Home / Self Care | Payer: BLUE CROSS/BLUE SHIELD | Attending: Student | Admitting: Student

## 2020-09-22 ENCOUNTER — Encounter (HOSPITAL_COMMUNITY): Payer: Self-pay

## 2020-09-22 ENCOUNTER — Ambulatory Visit: Payer: Self-pay | Admitting: Emergency Medicine

## 2020-09-22 ENCOUNTER — Other Ambulatory Visit: Payer: Self-pay | Admitting: Emergency Medicine

## 2020-09-22 DIAGNOSIS — Z87442 Personal history of urinary calculi: Secondary | ICD-10-CM

## 2020-09-22 DIAGNOSIS — U071 COVID-19: Secondary | ICD-10-CM

## 2020-09-22 DIAGNOSIS — R1032 Left lower quadrant pain: Secondary | ICD-10-CM

## 2020-09-22 DIAGNOSIS — N529 Male erectile dysfunction, unspecified: Secondary | ICD-10-CM

## 2020-09-22 LAB — POCT URINALYSIS DIPSTICK, ED / UC
Bilirubin Urine: NEGATIVE
Glucose, UA: 250 mg/dL — AB
Hgb urine dipstick: NEGATIVE
Ketones, ur: 15 mg/dL — AB
Leukocytes,Ua: NEGATIVE
Nitrite: NEGATIVE
Protein, ur: 30 mg/dL — AB
Specific Gravity, Urine: 1.025 (ref 1.005–1.030)
Urobilinogen, UA: 1 mg/dL (ref 0.0–1.0)
pH: 5 (ref 5.0–8.0)

## 2020-09-22 MED ORDER — NAPROXEN 500 MG PO TABS: 500.0000 mg | ORAL_TABLET | Freq: Two times a day (BID) | ORAL | 0 refills | Status: DC

## 2020-09-22 NOTE — ED Triage Notes (Signed)
Pt presents with costant abdominal pain that started around midnight. Pt states the pain radiates from the left lower back to LLQ. Pt states he has taken Tylenol and antiviral medicine. He states he has a hx of kidney stone.

## 2020-09-22 NOTE — Telephone Encounter (Signed)
Requested Prescriptions  Pending Prescriptions Disp Refills  . sildenafil (REVATIO) 20 MG tablet [Pharmacy Med Name: SILDENAFIL 20 MG TABLET] 75 tablet 0    Sig: TAKE 2 TABLETS APPROXIMATELY 30 TO 60 MINUTES BEFORE INTERCOURSE.     Urology: Erectile Dysfunction Agents Passed - 09/22/2020  2:43 PM      Passed - Last BP in normal range    BP Readings from Last 1 Encounters:  09/22/20 120/80         Passed - Valid encounter within last 12 months    Recent Outpatient Visits          1 week ago COVID-19 virus infection   Primary Care at [facility], [place], MD   6 months ago Hypertension associated with diabetes [facility])   Primary Care at [facility], Ines Bloomer, MD   7 months ago Encounter for general adult medical examination with abnormal findings   Primary Care at [facility], Ines Bloomer, MD   7 months ago Annual physical exam   Primary Care at [place], [place], MD   1 year ago Hypertension associated with diabetes [facility])   Primary Care at [facility], Ines Bloomer, MD      Future Appointments            Tomorrow [place], Ines Bloomer, MD Primary Care at [place], [facility]   In [place] week [place], Ines Bloomer, MD Primary Care at [place], [facility]   In 4 months [place], Ines Bloomer, MD Primary Care at [place], [facility]

## 2020-09-22 NOTE — Telephone Encounter (Signed)
09/22/2020 - I RECEIVED A CALL FROM THE PEC (CAROL PULLINS RN) REGARDING [name]. HE IS COMPLAINING OF HAVING ABDOMINAL PAIN. SHE WANTED HIM TO BE SEE IN OUR OFFICE TODAY. DR.MIGUEL IS HIS PRIMARY CARE PROVIDER. I EXPLAINED TO HER THAT BOTH OF OUR PROVIDERS TODAY ARE FULL. I WANTED TO CHECK WITH THE CLINICAL STAFF TO BE SURE HE COULD GO TO AN URGENT CARE IF NEEDED. HE DOES HAVE AN APPOINTMENT WITH DR. Kittie Plater TOMORROW (Wednesday) 09/23/2020. MACKENZIE SAID IT WAS ALRIGHT FOR HIM TO GO TO URGENT CARE IF HE COULD NOT WAIT. [place]

## 2020-09-22 NOTE — Discharge Instructions (Addendum)
-  Your urine sample looked normal today. Despite this, you still may have a kidney stone.  -At home, drink plenty of water to help flush your kidneys. This is very helpful if you do have a kidney stone.  -You can also take Naproxen, up to 2 pills a day with food. This will help with your pain.  -If your pain is getting worse instead of better; if the pain migraines to your right lower quadrant; if you develop chest pain, shortness of breath, etc; please seek immediate medical attention.

## 2020-09-22 NOTE — Telephone Encounter (Signed)
FYI: This pt called complaining of severe abdominal pain our office could not accommodate a sooner appt for today, pt was advised urgent care if severe. Pt may still follow up with you tomorrow.

## 2020-09-22 NOTE — ED Provider Notes (Signed)
[facility]    CSN: [id number] Arrival date & time: 09/22/20  1034      History   Chief Complaint Chief Complaint  Patient presents with  . Abdominal Pain    HPI Jacob Chan is a 65 y.o. male presenting for continued covid-19 symptoms, improving on their own, and LLQ pain intermittently for 3 days.  -For Covid-19, he was diagnosed with this on 09/14/2020. Since then, has been taking tessalon and cough syrup as prescribed, with control of his symptoms. States he is feeling much better than he was -for LLQ pain, this pt has a history of multiple kidney stones and CKD related to this. Today presenting with 3 days of crampy LLQ pain intermittently, radiating to lower left back. States this feels like previous kidney stones. Endorses some dark urine but denies other urinary symptoms. Denies hematuria, dysuria, frequency, urgency, back pain, n/v/d/abd pain, fevers/chills, abdnormal penile discharge, chest pain, shortness of breath. Denies RLQ or RUQ pain. He does still have his appendix and gallbladder. Still having bowel movements, last one was today.  HPI  Past Medical History:  Diagnosis Date  . Chronic kidney disease    many kidney stones  . Diabetes mellitus   . Diabetes mellitus without complication ([place])    Phreesia 09/09/2020  . Hypertension     Patient Active Problem List   Diagnosis Date Noted  . Hypertension associated with diabetes ([place]) 12/20/2016  . Dyslipidemia associated with type 2 diabetes mellitus ([place]) 12/20/2016    Past Surgical History:  Procedure Laterality Date  . LITHOTRIPSY     3 yrs ago       Home Medications    Prior to Admission medications   Medication Sig Start Date End Date Taking? Authorizing Provider  naproxen (NAPROSYN) 500 MG tablet Take 1 tablet (500 mg total) by mouth 2 (two) times daily. 09/22/20  Yes Hazel Sams, PA-C  acetaminophen (TYLENOL) 500 MG tablet Take 1,000 mg by mouth every 6 (six) hours as needed.  PAIN    [provider]  amLODipine (NORVASC) 5 MG tablet Take 1 tablet (5 mg total) by mouth every morning. 09/23/19 02/03/20  Horald Pollen, MD  ascorbic acid (VITAMIN C) 500 MG tablet Take 500 mg by mouth daily.    [provider]  aspirin EC 81 MG tablet Take 81 mg by mouth daily.    [provider]  benzonatate (TESSALON) 100 MG capsule Take 1-2 capsules (100-200 mg total) by mouth 3 (three) times daily as needed. 09/14/20   Jaynee Eagles, PA-C  cetirizine (ZYRTEC ALLERGY) 10 MG tablet Take 1 tablet (10 mg total) by mouth daily. 09/14/20   Jaynee Eagles, PA-C  diclofenac sodium (VOLTAREN) 1 % GEL Apply 2 g topically 2 (two) times daily. 02/18/19   Horald Pollen, MD  DM-APAP-CPM (CORICIDIN HBP PO) Take by mouth as needed.    [provider]  glipiZIDE (GLUCOTROL) 5 MG tablet Take 1 tablet (5 mg total) by mouth daily with breakfast. 09/24/19 02/03/20  Horald Pollen, MD  lisinopril (ZESTRIL) 20 MG tablet Take 1 tablet (20 mg total) by mouth daily. 09/23/19   Horald Pollen, MD  metFORMIN (GLUCOPHAGE-XR) 500 MG 24 hr tablet TAKE 1 TABLET WITH BREAKFAST 09/23/19   Sagardia, Ines Bloomer, MD  Multiple Vitamins-Minerals (CENTRUM SILVER PO) Take 1 tablet by mouth daily.    [provider]  predniSONE (DELTASONE) 10 MG tablet Take 3 tablets (30 mg total) by mouth daily with  breakfast. 09/14/20   Jaynee Eagles, PA-C  promethazine-dextromethorphan (PROMETHAZINE-DM) 6.25-15 MG/5ML syrup Take 5 mLs by mouth at bedtime as needed for cough. 09/14/20   Jaynee Eagles, PA-C  rosuvastatin (CRESTOR) 5 MG tablet Take 1 tablet (5 mg total) by mouth daily. 09/23/19 02/03/20  Horald Pollen, MD  sildenafil (REVATIO) 20 MG tablet TAKE 2 TABLETS APPROXIMATELY 30 TO 60 MINUTES BEFORE INTERCOURSE. 05/07/20   Horald Pollen, MD    Family History Family History  Problem Relation Age of Onset  . Diabetes Mother   . Heart disease Mother   . Diabetes Father    . Colon cancer Neg Hx   . Esophageal cancer Neg Hx   . Stomach cancer Neg Hx   . Rectal cancer Neg Hx     Social History Social History   Tobacco Use  . Smoking status: Never Smoker  . Smokeless tobacco: Never Used  Vaping Use  . Vaping Use: Never used  Substance Use Topics  . Alcohol use: No  . Drug use: No     Allergies   Patient has no known allergies.   Review of Systems Review of Systems  Constitutional: Negative for appetite change, chills, diaphoresis and fever.  HENT: Negative for congestion, sinus pressure, sinus pain and sore throat.   Respiratory: Negative for cough and shortness of breath.   Cardiovascular: Negative for chest pain.  Gastrointestinal: Positive for abdominal pain. Negative for blood in stool, constipation, diarrhea, nausea and vomiting.  Genitourinary: Positive for hematuria. Negative for decreased urine volume, difficulty urinating, dysuria, flank pain, frequency, genital sores, penile discharge, penile pain, penile swelling, scrotal swelling, testicular pain and urgency.  Musculoskeletal: Negative for back pain.  Neurological: Negative for dizziness, weakness and light-headedness.  All other systems reviewed and are negative.    Physical Exam Triage Vital Signs ED Triage Vitals  Enc Vitals Group     BP 09/22/20 1116 120/80     Pulse Rate 09/22/20 1116 (!) 102     Resp 09/22/20 1116 16     Temp 09/22/20 1116 98.7 F (37.1 C)     Temp Source 09/22/20 1116 Oral     SpO2 09/22/20 1116 100 %     Weight --      Height --      Head Circumference --      Peak Flow --      Pain Score 09/22/20 1115 7     Pain Loc --      Pain Edu? --      Excl. in [place]? --    No data found.  Updated Vital Signs BP 120/80 (BP Location: Left Arm)   Pulse (!) 102   Temp 98.7 F (37.1 C) (Oral)   Resp 16   SpO2 100%   Visual Acuity Right Eye Distance:   Left Eye Distance:   Bilateral Distance:    Right Eye Near:   Left Eye Near:    Bilateral  Near:     Physical Exam Vitals reviewed.  Constitutional:      General: He is not in acute distress.    Appearance: Normal appearance. He is not ill-appearing.  HENT:     Head: Normocephalic and atraumatic.     Right Ear: Hearing, tympanic membrane, ear canal and external ear normal. No swelling or tenderness. There is no impacted cerumen. No mastoid tenderness. Tympanic membrane is not perforated, erythematous, retracted or bulging.     Left Ear: Hearing, tympanic membrane, ear canal and external  ear normal. No swelling or tenderness. There is no impacted cerumen. No mastoid tenderness. Tympanic membrane is not perforated, erythematous, retracted or bulging.     Nose:     Right Sinus: No maxillary sinus tenderness or frontal sinus tenderness.     Left Sinus: No maxillary sinus tenderness or frontal sinus tenderness.     Mouth/Throat:     Mouth: Mucous membranes are moist.     Pharynx: Uvula midline. No oropharyngeal exudate or posterior oropharyngeal erythema.     Tonsils: No tonsillar exudate.  Cardiovascular:     Rate and Rhythm: Normal rate and regular rhythm.     Heart sounds: Normal heart sounds.  Pulmonary:     Breath sounds: Normal breath sounds and air entry. No wheezing, rhonchi or rales.  Chest:     Chest wall: No tenderness.  Abdominal:     General: Abdomen is flat. Bowel sounds are normal.     Tenderness: There is abdominal tenderness in the left lower quadrant. There is no right CVA tenderness, left CVA tenderness, guarding or rebound. Negative signs include Murphy's sign and McBurney's sign.     Comments: LLQ pain with deep palpation, no rebound   Lymphadenopathy:     Cervical: No cervical adenopathy.  Neurological:     General: No focal deficit present.     Mental Status: He is alert and oriented to person, place, and time.  Psychiatric:        Attention and Perception: Attention and perception normal.        Mood and Affect: Mood and affect normal.         Behavior: Behavior normal. Behavior is cooperative.        Thought Content: Thought content normal.        Judgment: Judgment normal.      UC Treatments / Results  Labs (all labs ordered are listed, but only abnormal results are displayed) Labs Reviewed  POCT URINALYSIS DIPSTICK, ED / UC - Abnormal; Notable for the following components:      Result Value   Glucose, UA 250 (*)    Ketones, ur 15 (*)    Protein, ur 30 (*)    All other components within normal limits  URINE CULTURE    EKG   Radiology No results found.  Procedures Procedures (including critical care time)  Medications Ordered in UC Medications - No data to display  Initial Impression / Assessment and Plan / UC Course  I have reviewed the triage vital signs and the nursing notes.  Pertinent labs & imaging results that were available during my care of the patient were reviewed by me and considered in my medical decision making (see chart for details).     Pt with history of kidney stones and CKD presenting today with LLQ pain and hematuria intermittently for 3 days. UA today with glucose, ketones, protein- no hematuria, leuk, nitrite. Culture sent. Discussed that he may have kidney stone despite fairly normal UA. Naproxen sent, and rec good hydration. Strict return precautions- worsening of pain, migration of pain to right side, new/worsening fevers/chills, chest pain, shortness of breath, etc. patinet verbalizes understanding and agreement.   For covid19,  afebrile nontachycardic nontachypneic, oxygenating well on room air. Continue OTC and prescription medications as needed.  Final Clinical Impressions(s) / UC Diagnoses   Final diagnoses:  LLQ pain  History of kidney stones  COVID-19     Discharge Instructions     -Your urine sample looked normal today. Despite  this, you still may have a kidney stone.  -At home, drink plenty of water to help flush your kidneys. This is very helpful if you do have a  kidney stone.  -You can also take Naproxen, up to 2 pills a day with food. This will help with your pain.  -If your pain is getting worse instead of better; if the pain migraines to your right lower quadrant; if you develop chest pain, shortness of breath, etc; please seek immediate medical attention.    ED Prescriptions    Medication Sig Dispense Auth. Provider   naproxen (NAPROSYN) 500 MG tablet Take 1 tablet (500 mg total) by mouth 2 (two) times daily. 30 tablet Hazel Sams, PA-C     PDMP not reviewed this encounter.   Hazel Sams, PA-C 09/22/20 1211

## 2020-09-22 NOTE — Telephone Encounter (Signed)
Phone call from pt.  C/o constant left, mid- abdominal pain, that started at midnight.  Denied radiation of pain.  Reported pain at 6-7/10.  Felt feverish at 1:00 AM; denied fever at present.  Vomited x one, after eating oatmeal.  Reported the pain started to ease, after vomiting, but is "building back up".  Stated he has recovered from [place] from 09/07/20.  Currently denied any change in bowel pattern; reported he had diarrhea last week, which has resolved.  Denied bloating.  Is passing gas.  Has hx of kidney stone, and stated this feels very similar to having kidney stone.  Called office; spoke to [employer].  No available appts. Today.    Pt. Advised to [facility] to [facility] for current symptoms.  Care advice given per protocol.  Verb. Understanding.  Agreed with plan.   Reason for Disposition . [1] MILD-MODERATE pain AND [2] constant AND [3] present > 2 hours  Answer Assessment - Initial Assessment Questions 1. LOCATION: "Where does it hurt?"      Left side of abdomen 2. RADIATION: "Does the pain shoot anywhere else?" (e.g., chest, back)     Denied  3. ONSET: "When did the pain begin?" (Minutes, hours or days ago)      Midnight last night 4. SUDDEN: "Gradual or sudden onset?"     Sudden  5. PATTERN "Does the pain come and go, or is it constant?"    - If constant: "Is it getting better, staying the same, or worsening?"      (Note: Constant means the pain never goes away completely; most serious pain is constant and it progresses)     - If intermittent: "How long does it last?" "Do you have pain now?"     (Note: Intermittent means the pain goes away completely between bouts)     constant 6. SEVERITY: "How bad is the pain?"  (e.g., Scale 1-10; mild, moderate, or severe)    - MILD (1-3): doesn't interfere with normal activities, abdomen soft and not tender to touch     - MODERATE (4-7): interferes with normal activities or awakens from sleep, tender to touch     - SEVERE (8-10): excruciating pain,  doubled over, unable to do any normal activities       6-7/ 10  7. RECURRENT SYMPTOM: "Have you ever had this type of stomach pain before?" If Yes, ask: "When was the last time?" and "What happened that time?"      "It feels similar to kidney stones that I have had before" 8. CAUSE: "What do you think is causing the stomach pain?"     Unknown  9. RELIEVING/AGGRAVATING FACTORS: "What makes it better or worse?" (e.g., movement, antacids, bowel movement)     Vomited x 1; got temporary relief, then pain started to build again 10. OTHER SYMPTOMS: "Has there been any vomiting, diarrhea, constipation, or urine problems?"       Felt feverish about 1:00 AM; denied fever at present.  Had diarrhea about one week ago. Reported dark yellow urine; drinking 3-4 bottles of H20.  Denied bloating; is passing gas.  Protocols used: ABDOMINAL PAIN - MALE-A-AH

## 2020-09-23 ENCOUNTER — Ambulatory Visit: Payer: BLUE CROSS/BLUE SHIELD | Admitting: Emergency Medicine

## 2020-09-23 ENCOUNTER — Encounter: Payer: Self-pay | Admitting: Emergency Medicine

## 2020-09-23 ENCOUNTER — Other Ambulatory Visit: Payer: Self-pay

## 2020-09-23 VITALS — BP 128/82 | HR 103 | Temp 98.3°F | Resp 16 | Ht 69.5 in | Wt 175.0 lb

## 2020-09-23 DIAGNOSIS — U071 COVID-19: Secondary | ICD-10-CM | POA: Diagnosis not present

## 2020-09-23 DIAGNOSIS — I152 Hypertension secondary to endocrine disorders: Secondary | ICD-10-CM | POA: Diagnosis not present

## 2020-09-23 DIAGNOSIS — E1159 Type 2 diabetes mellitus with other circulatory complications: Secondary | ICD-10-CM | POA: Diagnosis not present

## 2020-09-23 LAB — POCT GLYCOSYLATED HEMOGLOBIN (HGB A1C): Hemoglobin A1C: 7 % — AB (ref 4.0–5.6)

## 2020-09-23 LAB — URINE CULTURE: Culture: NO GROWTH

## 2020-09-23 LAB — GLUCOSE, POCT (MANUAL RESULT ENTRY): POC Glucose: 131 mg/dl — AB (ref 70–99)

## 2020-09-23 NOTE — Assessment & Plan Note (Signed)
Well-controlled hypertension.  Continue present medications.  No changes. Well-controlled diabetes with hemoglobin A1c at 7.0.  Continue present medications.  No changes. Diet and nutrition discussed. Follow-up in 3 to 6 months.

## 2020-09-23 NOTE — Patient Instructions (Addendum)
   If you have lab work done today you will be contacted with your lab results within the next 2 weeks.  If you have not heard from us then please contact us. The fastest way to get your results is to register for My Chart.   IF you received an x-ray today, you will receive an invoice from [facility]. Please contact [facility] at [phone] with questions or concerns regarding your invoice.   IF you received labwork today, you will receive an invoice from [facility]. Please contact [facility] at [phone] with questions or concerns regarding your invoice.   Our billing staff will not be able to assist you with questions regarding bills from these companies.  You will be contacted with the lab results as soon as they are available. The fastest way to get your results is to activate your My Chart account. Instructions are located on the last page of this paperwork. If you have not heard from us regarding the results in 2 weeks, please contact this office.     Diabetes Mellitus and Nutrition, Adult When you have diabetes, or diabetes mellitus, it is very important to have healthy eating habits because your blood sugar (glucose) levels are greatly affected by what you eat and drink. Eating healthy foods in the right amounts, at about the same times every day, can help you:  Control your blood glucose.  Lower your risk of heart disease.  Improve your blood pressure.  Reach or maintain a healthy weight. What can affect my meal plan? Every person with diabetes is different, and each person has different needs for a meal plan. Your health care provider may recommend that you work with a dietitian to make a meal plan that is best for you. Your meal plan may vary depending on factors such as:  The calories you need.  The medicines you take.  Your weight.  Your blood glucose, blood pressure, and cholesterol levels.  Your activity level.  Other health conditions you  have, such as heart or kidney disease. How do carbohydrates affect me? Carbohydrates, also called carbs, affect your blood glucose level more than any other type of food. Eating carbs naturally raises the amount of glucose in your blood. Carb counting is a method for keeping track of how many carbs you eat. Counting carbs is important to keep your blood glucose at a healthy level, especially if you use insulin or take certain oral diabetes medicines. It is important to know how many carbs you can safely have in each meal. This is different for every person. Your dietitian can help you calculate how many carbs you should have at each meal and for each snack. How does alcohol affect me? Alcohol can cause a sudden decrease in blood glucose (hypoglycemia), especially if you use insulin or take certain oral diabetes medicines. Hypoglycemia can be a life-threatening condition. Symptoms of hypoglycemia, such as sleepiness, dizziness, and confusion, are similar to symptoms of having too much alcohol.  Do not drink alcohol if: ? Your health care provider tells you not to drink. ? You are pregnant, may be pregnant, or are planning to become pregnant.  If you drink alcohol: ? Do not drink on an empty stomach. ? Limit how much you use to:  0-1 drink a day for women.  0-2 drinks a day for men. ? Be aware of how much alcohol is in your drink. In the [place] one drink equals one 12 oz bottle of beer (355 mL),   one 5 oz glass of wine (148 mL), or one 1 oz glass of hard liquor (44 mL). ? Keep yourself hydrated with water, diet soda, or unsweetened iced tea.  Keep in mind that regular soda, juice, and other mixers may contain a lot of sugar and must be counted as carbs. What are tips for following this plan? Reading food labels  Start by checking the serving size on the "Nutrition Facts" label of packaged foods and drinks. The amount of calories, carbs, fats, and other nutrients listed on the label is based on  one serving of the item. Many items contain more than one serving per package.  Check the total grams (g) of carbs in one serving. You can calculate the number of servings of carbs in one serving by dividing the total carbs by 15. For example, if a food has 30 g of total carbs per serving, it would be equal to 2 servings of carbs.  Check the number of grams (g) of saturated fats and trans fats in one serving. Choose foods that have a low amount or none of these fats.  Check the number of milligrams (mg) of salt (sodium) in one serving. Most people should limit total sodium intake to less than 2,300 mg per day.  Always check the nutrition information of foods labeled as "low-fat" or "nonfat." These foods may be higher in added sugar or refined carbs and should be avoided.  Talk to your dietitian to identify your daily goals for nutrients listed on the label. Shopping  Avoid buying canned, pre-made, or processed foods. These foods tend to be high in fat, sodium, and added sugar.  Shop around the outside edge of the grocery store. This is where you will most often find fresh fruits and vegetables, bulk grains, fresh meats, and fresh dairy. Cooking  Use low-heat cooking methods, such as baking, instead of high-heat cooking methods like deep frying.  Cook using healthy oils, such as olive, canola, or sunflower oil.  Avoid cooking with butter, cream, or high-fat meats. Meal planning  Eat meals and snacks regularly, preferably at the same times every day. Avoid going long periods of time without eating.  Eat foods that are high in fiber, such as fresh fruits, vegetables, beans, and whole grains. Talk with your dietitian about how many servings of carbs you can eat at each meal.  Eat 4-6 oz (112-168 g) of lean protein each day, such as lean meat, chicken, fish, eggs, or tofu. One ounce (oz) of lean protein is equal to: ? 1 oz (28 g) of meat, chicken, or fish. ? 1 egg. ?  cup (62 g) of  tofu.  Eat some foods each day that contain healthy fats, such as avocado, nuts, seeds, and fish.   What foods should I eat? Fruits Berries. Apples. Oranges. Peaches. Apricots. Plums. Grapes. Mango. Papaya. Pomegranate. Kiwi. Cherries. Vegetables Lettuce. Spinach. Leafy greens, including kale, chard, collard greens, and mustard greens. Beets. Cauliflower. Cabbage. Broccoli. Carrots. Green beans. Tomatoes. Peppers. Onions. Cucumbers. Brussels sprouts. Grains Whole grains, such as whole-wheat or whole-grain bread, crackers, tortillas, cereal, and pasta. Unsweetened oatmeal. Quinoa. Brown or wild rice. Meats and other proteins Seafood. Poultry without skin. Lean cuts of poultry and beef. Tofu. Nuts. Seeds. Dairy Low-fat or fat-free dairy products such as milk, yogurt, and cheese. The items listed above may not be a complete list of foods and beverages you can eat. Contact a dietitian for more information. What foods should I avoid? Fruits Fruits canned   with syrup. Vegetables Canned vegetables. Frozen vegetables with butter or cream sauce. Grains Refined white flour and flour products such as bread, pasta, snack foods, and cereals. Avoid all processed foods. Meats and other proteins Fatty cuts of meat. Poultry with skin. Breaded or fried meats. Processed meat. Avoid saturated fats. Dairy Full-fat yogurt, cheese, or milk. Beverages Sweetened drinks, such as soda or iced tea. The items listed above may not be a complete list of foods and beverages you should avoid. Contact a dietitian for more information. Questions to ask a health care provider  Do I need to meet with a diabetes educator?  Do I need to meet with a dietitian?  What number can I call if I have questions?  When are the best times to check my blood glucose? Where to find more information:  American Diabetes Association: diabetes.org  Academy of Nutrition and Dietetics: www.eatright.org  National Institute of  Diabetes and Digestive and Kidney Diseases: www.niddk.nih.gov  Association of Diabetes Care and Education Specialists: www.diabeteseducator.org Summary  It is important to have healthy eating habits because your blood sugar (glucose) levels are greatly affected by what you eat and drink.  A healthy meal plan will help you control your blood glucose and maintain a healthy lifestyle.  Your health care provider may recommend that you work with a dietitian to make a meal plan that is best for you.  Keep in mind that carbohydrates (carbs) and alcohol have immediate effects on your blood glucose levels. It is important to count carbs and to use alcohol carefully. This information is not intended to replace advice given to you by your health care provider. Make sure you discuss any questions you have with your health care provider. Document Revised: 07/23/2019 Document Reviewed: 07/23/2019 Elsevier Patient Education  2021 [employer]  

## 2020-09-23 NOTE — Progress Notes (Signed)
Jacob Chan 65 y.o.   Chief Complaint  Patient presents with  . Diabetes    Follow up    HISTORY OF PRESENT ILLNESS: This is a 65 y.o. male with history of diabetes here for follow-up. Recent history of Covid.  Recovering well. Seen in urgent care center yesterday with left lower quadrant abdominal pain believed to be secondary to kidney stones. No other complaints or medical concerns today. Lab Results  Component Value Date   HGBA1C 6.8 (H) 01/29/2020    HPI   Prior to Admission medications   Medication Sig Start Date End Date Taking? Authorizing Provider  acetaminophen (TYLENOL) 500 MG tablet Take 1,000 mg by mouth every 6 (six) hours as needed. PAIN   Yes [provider]  ascorbic acid (VITAMIN C) 500 MG tablet Take 500 mg by mouth daily.   Yes [provider]  aspirin EC 81 MG tablet Take 81 mg by mouth daily.   Yes [provider]  DM-APAP-CPM (CORICIDIN HBP PO) Take by mouth as needed.   Yes [provider]  lisinopril (ZESTRIL) 20 MG tablet Take 1 tablet (20 mg total) by mouth daily. 09/23/19  Yes Horald Pollen, MD  metFORMIN (GLUCOPHAGE-XR) 500 MG 24 hr tablet TAKE 1 TABLET WITH BREAKFAST 09/23/19  Yes Talena Neira, Ines Bloomer, MD  Multiple Vitamins-Minerals (CENTRUM SILVER PO) Take 1 tablet by mouth daily.   Yes [provider]  naproxen (NAPROSYN) 500 MG tablet Take 1 tablet (500 mg total) by mouth 2 (two) times daily. 09/22/20  Yes Hazel Sams, PA-C  promethazine-dextromethorphan (PROMETHAZINE-DM) 6.25-15 MG/5ML syrup Take 5 mLs by mouth at bedtime as needed for cough. 09/14/20  Yes Jaynee Eagles, PA-C  sildenafil (REVATIO) 20 MG tablet TAKE 2 TABLETS APPROXIMATELY 30 TO 60 MINUTES BEFORE INTERCOURSE. 09/22/20  Yes Mykah Bellomo, Ines Bloomer, MD  amLODipine (NORVASC) 5 MG tablet Take 1 tablet (5 mg total) by mouth every morning. 09/23/19 02/03/20  Horald Pollen, MD  benzonatate (TESSALON) 100 MG capsule Take 1-2  capsules (100-200 mg total) by mouth 3 (three) times daily as needed. Patient not taking: Reported on 09/23/2020 09/14/20   Jaynee Eagles, PA-C  cetirizine (ZYRTEC ALLERGY) 10 MG tablet Take 1 tablet (10 mg total) by mouth daily. Patient not taking: Reported on 09/23/2020 09/14/20   Jaynee Eagles, PA-C  diclofenac sodium (VOLTAREN) 1 % GEL Apply 2 g topically 2 (two) times daily. Patient not taking: Reported on 09/23/2020 02/18/19   Horald Pollen, MD  glipiZIDE (GLUCOTROL) 5 MG tablet Take 1 tablet (5 mg total) by mouth daily with breakfast. 09/24/19 02/03/20  Horald Pollen, MD  predniSONE (DELTASONE) 10 MG tablet Take 3 tablets (30 mg total) by mouth daily with breakfast. Patient not taking: Reported on 09/23/2020 09/14/20   Jaynee Eagles, PA-C  rosuvastatin (CRESTOR) 5 MG tablet Take 1 tablet (5 mg total) by mouth daily. 09/23/19 02/03/20  Horald Pollen, MD    No Known Allergies  Patient Active Problem List   Diagnosis Date Noted  . Hypertension associated with diabetes ([place]) 12/20/2016  . Dyslipidemia associated with type 2 diabetes mellitus ([place]) 12/20/2016    Past Medical History:  Diagnosis Date  . Chronic kidney disease    many kidney stones  . Diabetes mellitus   . Diabetes mellitus without complication ([place])    Phreesia 09/09/2020  . Hypertension     Past Surgical History:  Procedure Laterality Date  . LITHOTRIPSY     3 yrs ago  Social History   Socioeconomic History  . Marital status: Married    Spouse name: [name]  . Number of children: 4  . Years of education: Not on file  . Highest education level: Not on file  Occupational History  . Not on file  Tobacco Use  . Smoking status: Never Smoker  . Smokeless tobacco: Never Used  Vaping Use  . Vaping Use: Never used  Substance and Sexual Activity  . Alcohol use: No  . Drug use: No  . Sexual activity: Yes  Other Topics Concern  . Not on file  Social History Narrative  . Not on file   Social  Determinants of Health   Financial Resource Strain: Not on file  Food Insecurity: Not on file  Transportation Needs: Not on file  Physical Activity: Not on file  Stress: Not on file  Social Connections: Not on file  Intimate Partner Violence: Not on file    Family History  Problem Relation Age of Onset  . Diabetes Mother   . Heart disease Mother   . Diabetes Father   . Colon cancer Neg Hx   . Esophageal cancer Neg Hx   . Stomach cancer Neg Hx   . Rectal cancer Neg Hx      Review of Systems  Constitutional: Negative.  Negative for chills and fever.  HENT: Negative.  Negative for congestion and sore throat.   Respiratory: Negative.  Negative for cough and shortness of breath.   Cardiovascular: Negative.  Negative for chest pain and palpitations.  Gastrointestinal: Negative.  Negative for abdominal pain, diarrhea, nausea and vomiting.  Genitourinary: Negative.  Negative for dysuria and hematuria.  Musculoskeletal: Negative.  Negative for back pain and myalgias.  Skin: Negative.  Negative for rash.  Neurological: Negative.  Negative for dizziness and headaches.  All other systems reviewed and are negative.   Today's Vitals   09/23/20 1639  BP: 128/82  Pulse: (!) 103  Resp: 16  Temp: 98.3 F (36.8 C)  TempSrc: Temporal  SpO2: 95%  Weight: 175 lb (79.4 kg)  Height: 5' 9.5" (1.765 m)   Body mass index is 25.47 kg/m. Wt Readings from Last 3 Encounters:  09/23/20 175 lb (79.4 kg)  09/09/20 185 lb (83.9 kg)  03/18/20 190 lb (86.2 kg)    Physical Exam Vitals reviewed.  Constitutional:      Appearance: Normal appearance.  HENT:     Head: Normocephalic.  Eyes:     Extraocular Movements: Extraocular movements intact.     Pupils: Pupils are equal, round, and reactive to light.  Cardiovascular:     Rate and Rhythm: Normal rate and regular rhythm.     Heart sounds: Murmur (systolic 3/6) heard.    Pulmonary:     Effort: Pulmonary effort is normal.     Breath  sounds: Normal breath sounds.  Musculoskeletal:        General: Normal range of motion.     Cervical back: Normal range of motion and neck supple.  Skin:    General: Skin is warm and dry.     Capillary Refill: Capillary refill takes less than 2 seconds.  Neurological:     General: No focal deficit present.     Mental Status: He is alert and oriented to person, place, and time.  Psychiatric:        Mood and Affect: Mood normal.        Behavior: Behavior normal.     Results for orders placed or  performed in visit on 09/23/20 (from the past 24 hour(s))  POCT glucose (manual entry)     Status: Abnormal   Collection Time: 09/23/20  5:05 PM  Result Value Ref Range   POC Glucose 131 (A) 70 - 99 mg/dl  POCT glycosylated hemoglobin (Hb A1C)     Status: Abnormal   Collection Time: 09/23/20  5:11 PM  Result Value Ref Range   Hemoglobin A1C 7.0 (A) 4.0 - 5.6 %   HbA1c POC (<> result, manual entry)     HbA1c, POC (prediabetic range)     HbA1c, POC (controlled diabetic range)        ASSESSMENT & PLAN: Hypertension associated with diabetes ([place]) Well-controlled hypertension.  Continue present medications.  No changes. Well-controlled diabetes with hemoglobin A1c at 7.0.  Continue present medications.  No changes. Diet and nutrition discussed. Follow-up in 3 to 6 months.  Jayton was seen today for diabetes.  Diagnoses and all orders for this visit:  Hypertension associated with diabetes ([place]) -     POCT glycosylated hemoglobin (Hb A1C) -     POCT glucose (manual entry) -     Comprehensive metabolic panel  [id number] virus infection Comments: Recent and improving Orders: -     SAR CoV2 Serology (COVID 19)AB(IGG)IA    Patient Instructions       If you have lab work done today you will be contacted with your lab results within the next 2 weeks.  If you have not heard from [place] then please contact us. The fastest way to get your results is to register for My Chart.   IF you  received an x-ray today, you will receive an invoice from [facility]. Please contact [facility] at [phone] with questions or concerns regarding your invoice.   IF you received labwork today, you will receive an invoice from [place]. Please contact [facility] at [phone] with questions or concerns regarding your invoice.   Our billing staff will not be able to assist you with questions regarding bills from these companies.  You will be contacted with the lab results as soon as they are available. The fastest way to get your results is to activate your My Chart account. Instructions are located on the last page of this paperwork. If you have not heard from [place] regarding the results in 2 weeks, please contact this office.     Diabetes Mellitus and Nutrition, Adult When you have diabetes, or diabetes mellitus, it is very important to have healthy eating habits because your blood sugar (glucose) levels are greatly affected by what you eat and drink. Eating healthy foods in the right amounts, at about the same times every day, can help you:  Control your blood glucose.  Lower your risk of heart disease.  Improve your blood pressure.  Reach or maintain a healthy weight. What can affect my meal plan? Every person with diabetes is different, and each person has different needs for a meal plan. Your health care provider may recommend that you work with a dietitian to make a meal plan that is best for you. Your meal plan may vary depending on factors such as:  The calories you need.  The medicines you take.  Your weight.  Your blood glucose, blood pressure, and cholesterol levels.  Your activity level.  Other health conditions you have, such as heart or kidney disease. How do carbohydrates affect me? Carbohydrates, also called carbs, affect your blood glucose level more than any other type of food.  Eating carbs naturally raises the amount of glucose in your blood.  Carb counting is a method for keeping track of how many carbs you eat. Counting carbs is important to keep your blood glucose at a healthy level, especially if you use insulin or take certain oral diabetes medicines. It is important to know how many carbs you can safely have in each meal. This is different for every person. Your dietitian can help you calculate how many carbs you should have at each meal and for each snack. How does alcohol affect me? Alcohol can cause a sudden decrease in blood glucose (hypoglycemia), especially if you use insulin or take certain oral diabetes medicines. Hypoglycemia can be a life-threatening condition. Symptoms of hypoglycemia, such as sleepiness, dizziness, and confusion, are similar to symptoms of having too much alcohol.  Do not drink alcohol if: ? Your health care provider tells you not to drink. ? You are pregnant, may be pregnant, or are planning to become pregnant.  If you drink alcohol: ? Do not drink on an empty stomach. ? Limit how much you use to:  0-1 drink a day for women.  0-2 drinks a day for men. ? Be aware of how much alcohol is in your drink. In the [place] one drink equals one 12 oz bottle of beer (355 mL), one 5 oz glass of wine (148 mL), or one 1 oz glass of hard liquor (44 mL). ? Keep yourself hydrated with water, diet soda, or unsweetened iced tea.  Keep in mind that regular soda, juice, and other mixers may contain a lot of sugar and must be counted as carbs. What are tips for following this plan? Reading food labels  Start by checking the serving size on the "Nutrition Facts" label of packaged foods and drinks. The amount of calories, carbs, fats, and other nutrients listed on the label is based on one serving of the item. Many items contain more than one serving per package.  Check the total grams (g) of carbs in one serving. You can calculate the number of servings of carbs in one serving by dividing the total carbs by 15. For  example, if a food has 30 g of total carbs per serving, it would be equal to 2 servings of carbs.  Check the number of grams (g) of saturated fats and trans fats in one serving. Choose foods that have a low amount or none of these fats.  Check the number of milligrams (mg) of salt (sodium) in one serving. Most people should limit total sodium intake to less than 2,300 mg per day.  Always check the nutrition information of foods labeled as "low-fat" or "nonfat." These foods may be higher in added sugar or refined carbs and should be avoided.  Talk to your dietitian to identify your daily goals for nutrients listed on the label. Shopping  Avoid buying canned, pre-made, or processed foods. These foods tend to be high in fat, sodium, and added sugar.  Shop around the outside edge of the grocery store. This is where you will most often find fresh fruits and vegetables, bulk grains, fresh meats, and fresh dairy. Cooking  Use low-heat cooking methods, such as baking, instead of high-heat cooking methods like deep frying.  Cook using healthy oils, such as olive, canola, or sunflower oil.  Avoid cooking with butter, cream, or high-fat meats. Meal planning  Eat meals and snacks regularly, preferably at the same times every day. Avoid going long periods of time without  eating.  Eat foods that are high in fiber, such as fresh fruits, vegetables, beans, and whole grains. Talk with your dietitian about how many servings of carbs you can eat at each meal.  Eat 4-6 oz (112-168 g) of lean protein each day, such as lean meat, chicken, fish, eggs, or tofu. One ounce (oz) of lean protein is equal to: ? 1 oz (28 g) of meat, chicken, or fish. ? 1 egg. ?  cup (62 g) of tofu.  Eat some foods each day that contain healthy fats, such as avocado, nuts, seeds, and fish.   What foods should I eat? Fruits Berries. Apples. Oranges. Peaches. Apricots. Plums. Grapes. Mango. Papaya. Pomegranate. Kiwi.  Cherries. Vegetables Lettuce. Spinach. Leafy greens, including kale, chard, collard greens, and mustard greens. Beets. Cauliflower. Cabbage. Broccoli. Carrots. Green beans. Tomatoes. Peppers. Onions. Cucumbers. Brussels sprouts. Grains Whole grains, such as whole-wheat or whole-grain bread, crackers, tortillas, cereal, and pasta. Unsweetened oatmeal. Quinoa. Brown or wild rice. Meats and other proteins Seafood. Poultry without skin. Lean cuts of poultry and beef. Tofu. Nuts. Seeds. Dairy Low-fat or fat-free dairy products such as milk, yogurt, and cheese. The items listed above may not be a complete list of foods and beverages you can eat. Contact a dietitian for more information. What foods should I avoid? Fruits Fruits canned with syrup. Vegetables Canned vegetables. Frozen vegetables with butter or cream sauce. Grains Refined white flour and flour products such as bread, pasta, snack foods, and cereals. Avoid all processed foods. Meats and other proteins Fatty cuts of meat. Poultry with skin. Breaded or fried meats. Processed meat. Avoid saturated fats. Dairy Full-fat yogurt, cheese, or milk. Beverages Sweetened drinks, such as soda or iced tea. The items listed above may not be a complete list of foods and beverages you should avoid. Contact a dietitian for more information. Questions to ask a health care provider  Do I need to meet with a diabetes educator?  Do I need to meet with a dietitian?  What number can I call if I have questions?  When are the best times to check my blood glucose? Where to find more information:  American Diabetes Association: diabetes.org  Academy of Nutrition and Dietetics: www.eatright.[employer] of Diabetes and Digestive and Kidney Diseases: [employer]  Association of Diabetes Care and Education Specialists: www.diabeteseducator.org Summary  It is important to have healthy eating habits because your blood sugar  (glucose) levels are greatly affected by what you eat and drink.  A healthy meal plan will help you control your blood glucose and maintain a healthy lifestyle.  Your health care provider may recommend that you work with a dietitian to make a meal plan that is best for you.  Keep in mind that carbohydrates (carbs) and alcohol have immediate effects on your blood glucose levels. It is important to count carbs and to use alcohol carefully. This information is not intended to replace advice given to you by your health care provider. Make sure you discuss any questions you have with your health care provider. Document Revised: 07/23/2019 Document Reviewed: 07/23/2019 Elsevier Patient Education  2021 Elsevier Inc.      Agustina Caroli, MD [facility]

## 2020-09-24 LAB — COMPREHENSIVE METABOLIC PANEL
ALT: 22 IU/L (ref 0–44)
AST: 14 IU/L (ref 0–40)
Albumin/Globulin Ratio: 1.3 (ref 1.2–2.2)
Albumin: 4 g/dL (ref 3.8–4.8)
Alkaline Phosphatase: 70 IU/L (ref 44–121)
BUN/Creatinine Ratio: 19 (ref 10–24)
BUN: 23 mg/dL (ref 8–27)
Bilirubin Total: 0.6 mg/dL (ref 0.0–1.2)
CO2: 24 mmol/L (ref 20–29)
Calcium: 9.2 mg/dL (ref 8.6–10.2)
Chloride: 103 mmol/L (ref 96–106)
Creatinine, Ser: 1.18 mg/dL (ref 0.76–1.27)
GFR calc Af Amer: 74 mL/min/{1.73_m2} (ref >59)
GFR calc non Af Amer: 64 mL/min/{1.73_m2} (ref >59)
Globulin, Total: 3 g/dL (ref 1.5–4.5)
Glucose: 121 mg/dL — ABNORMAL HIGH (ref 65–99)
Potassium: 5.3 mmol/L — ABNORMAL HIGH (ref 3.5–5.2)
Sodium: 142 mmol/L (ref 134–144)
Total Protein: 7 g/dL (ref 6.0–8.5)

## 2020-09-24 LAB — SAR COV2 SEROLOGY (COVID19)AB(IGG),IA
SARS-CoV-2 Semi-Quant IgG Ab: >800 AU/mL (ref <13.0)
SARS-CoV-2 Spike Ab Interp: POSITIVE

## 2020-09-29 ENCOUNTER — Telehealth: Payer: Self-pay | Admitting: *Deleted

## 2020-09-29 NOTE — Telephone Encounter (Signed)
On 09/28/2020, faxed completed FMLA forms to [facility] [employer] [phone]). Confirmation at 12:20 pm.

## 2020-10-05 ENCOUNTER — Ambulatory Visit: Payer: Self-pay | Admitting: Emergency Medicine

## 2020-10-12 ENCOUNTER — Ambulatory Visit: Payer: Self-pay | Admitting: *Deleted

## 2020-10-12 NOTE — Telephone Encounter (Signed)
PT has questions and concerns regarding his cough / mucus / congestion. PT stated he has tried everything over the counter and was looking for some advice Patient reports diagnosed with covid Jan. 10,2022. Patient continues to have cough, congestion and mucus. Mucus is clear but is having SOB and coughing  with deep breathing. Patient denies fever, difficulty breathing or chest pain. Patient reports he is taking "mucinex" and he is not having relief from coughing and unable to cough up mucus . Patient would like to know any other medications that can be given to help him get over symptoms. Reviewed to try hard candy or cough drops and drinking warm fluids and 2 tsp (10 ml) at night . Patient reports he has tried OTC meds and continues to have symptoms. Care advise given. Patient verbalized understanding of care advise and to call back or go to [facility] or ED if symptoms worsen.   Reason for Disposition . [1] PERSISTING SYMPTOMS OF COVID-19 AND [2] symptoms BETTER (improving)  Answer Assessment - Initial Assessment Questions 1. COVID-19 ONSET: "When did the symptoms of COVID-19 first start?"     Diagnosed with covid Jan. 10, 2022. 2. DIAGNOSIS CONFIRMATION: "How were you diagnosed?" (e.g., COVID-19 oral or nasal viral test; COVID-19 antibody test; doctor visit)     na 3. MAIN SYMPTOM:  "What is your main concern or symptom right now?" (e.g., breathing difficulty, cough, fatigue. loss of smell)     Ongoing cough, congestion, mucus  4. SYMPTOM ONSET: "When did the  na  start?"     na 5. BETTER-SAME-WORSE: "Are you getting better, staying the same, or getting worse over the last 1 to 2 weeks?"     Better but symptoms will not go away 6. RECENT MEDICAL VISIT: "Have you been seen by a healthcare provider (doctor, NP, PA) for these persisting COVID-19 symptoms?" If Yes, ask: "When were you seen?" (e.g., date)     No  7. COUGH: "Do you have a cough?" If Yes, ask: "How bad is the cough?"       On going cough   8. FEVER: "Do you have a fever?" If Yes, ask: "What is your temperature, how was it measured, and when did it start?"     na 9. BREATHING DIFFICULTY: "Are you having any trouble breathing?" If Yes, ask: "How bad is your breathing?" (e.g., mild, moderate, severe)    - MILD: No SOB at rest, mild SOB with walking, speaks normally in sentences, can lie down, no retractions, pulse < 100.    - MODERATE: SOB at rest, SOB with minimal exertion and prefers to sit, cannot lie down flat, speaks in phrases, mild retractions, audible wheezing, pulse 100-120.    - SEVERE: Very SOB at rest, speaks in single words, struggling to breathe, sitting hunched forward, retractions, pulse > 120       SOB with deep breathing  10. HIGH RISK DISEASE: "Do you have any chronic medical problems?" (e.g., asthma, heart or lung disease, weak immune system, obesity, etc.)       na 11. VACCINE: "Have you gotten the COVID-19 vaccine?" If Yes ask: "Which one, how many shots, when did you get it?"       na 12. PREGNANCY: "Is there any chance you are pregnant?" "When was your last menstrual period?"       na 13. OTHER SYMPTOMS: "Do you have any other symptoms?"  (e.g., fatigue, headache, muscle pain, weakness)       Cough ,  congestion, mucus  Protocols used: CORONAVIRUS (COVID-19) PERSISTING SYMPTOMS FOLLOW-UP CALL-A-AH

## 2020-10-13 ENCOUNTER — Telehealth: Payer: Self-pay | Admitting: Emergency Medicine

## 2020-10-13 NOTE — Telephone Encounter (Signed)
QuickNote is grayscale (unable to enter note in Patient Calls queue in the [place]). Called patient to schedule virtual appt to address lingering post-covid symptoms. Voicemail full on his phone and his spouse's voicemail. Sent SMS message to spouse. Waiting for call back for scheduling.

## 2020-10-13 NOTE — Telephone Encounter (Signed)
Please schedule appt for f/u virtual for Post Covid symptoms that will not go away

## 2020-10-26 ENCOUNTER — Other Ambulatory Visit: Payer: Self-pay | Admitting: Emergency Medicine

## 2020-10-26 DIAGNOSIS — E785 Hyperlipidemia, unspecified: Secondary | ICD-10-CM

## 2020-10-26 DIAGNOSIS — E1159 Type 2 diabetes mellitus with other circulatory complications: Secondary | ICD-10-CM

## 2020-10-26 DIAGNOSIS — I152 Hypertension secondary to endocrine disorders: Secondary | ICD-10-CM

## 2020-10-26 DIAGNOSIS — I10 Essential (primary) hypertension: Secondary | ICD-10-CM

## 2020-10-26 NOTE — Telephone Encounter (Signed)
Requested Prescriptions  Pending Prescriptions Disp Refills  . lisinopril (ZESTRIL) 20 MG tablet [Pharmacy Med Name: LISINOPRIL 20 MG TABLET] 90 tablet 1    Sig: TAKE 1 TABLET ONCE DAILY.     Cardiovascular:  ACE Inhibitors Failed - 10/26/2020  1:23 PM      Failed - K in normal range and within 180 days    Potassium  Date Value Ref Range Status  09/23/2020 5.3 (H) 3.5 - 5.2 mmol/L Final         Passed - Cr in normal range and within 180 days    Creatinine, Ser  Date Value Ref Range Status  09/23/2020 1.18 0.76 - 1.27 mg/dL Final         Passed - Patient is not pregnant      Passed - Last BP in normal range    BP Readings from Last 1 Encounters:  09/23/20 128/82         Passed - Valid encounter within last 6 months    Recent Outpatient Visits          1 month ago Hypertension associated with diabetes [facility])   Primary Care at [facility], Ines Bloomer, MD   1 month ago COVID-19 virus infection   Primary Care at [place], [place], MD   7 months ago Hypertension associated with diabetes [facility])   Primary Care at [facility], Ines Bloomer, MD   8 months ago Encounter for general adult medical examination with abnormal findings   [facility] at [facility], Ines Bloomer, MD   9 months ago Annual physical exam   Primary Care at [facility], Ines Bloomer, [place]            In 3 months [place], Ines Bloomer, MD Primary Care at [place], [facility]   In 4 months [place], Ines Bloomer, MD Primary Care at [place], [facility]           . rosuvastatin (CRESTOR) 5 MG tablet [Pharmacy Med Name: ROSUVASTATIN CALCIUM 5 MG TAB] 30 tablet     Sig: TAKE 1 TABLET ONCE DAILY.     Cardiovascular:  Antilipid - Statins Failed - 10/26/2020  1:23 PM      Failed - LDL in normal range and within 360 days    LDL Chol Calc (NIH)  Date Value Ref Range Status  01/29/2020 80 0 - 99 mg/dL Final         Passed - Total Cholesterol in normal range and within 360 days     Cholesterol, Total  Date Value Ref Range Status  01/29/2020 153 100 - 199 mg/dL Final         Passed - HDL in normal range and within 360 days    HDL  Date Value Ref Range Status  01/29/2020 59 >39 mg/dL Final         Passed - Triglycerides in normal range and within 360 days    Triglycerides  Date Value Ref Range Status  01/29/2020 72 0 - 149 mg/dL Final         Passed - Patient is not pregnant      Passed - Valid encounter within last 12 months    Recent Outpatient Visits          1 month ago Hypertension associated with diabetes [facility])   [facility], Ines Bloomer, MD   1 month ago COVID-19 virus infection   Primary Care at [facility],  Ines Bloomer, MD   7 months ago Hypertension associated with diabetes [facility])   [facility] at [facility], Ines Bloomer, MD   8 months ago Encounter for general adult medical examination with abnormal findings   Primary Care at [facility], Ines Bloomer, MD   9 months ago Annual physical exam   Primary Care at [facility], Ines Bloomer, MD      Future Appointments            In 3 months [place], Ines Bloomer, MD Primary Care at [place], [facility]   In 4 months [place], Ines Bloomer, MD Primary Care at [place], [place]           . amLODipine ([place]) 5 MG tablet [Pharmacy Med Name: AMLODIPINE BESYLATE 5 MG TAB] 30 tablet     Sig: TAKE (1) TABLET DAILY IN THE MORNING.     Cardiovascular:  Calcium Channel Blockers Passed - 10/26/2020  1:23 PM      Passed - Last BP in normal range    BP Readings from Last 1 Encounters:  09/23/20 128/82         Passed - Valid encounter within last 6 months    Recent Outpatient Visits          1 month ago Hypertension associated with diabetes [facility])   [facility] at [facility], Ines Bloomer, MD   1 month ago COVID-19 virus infection   Primary Care at [place], [place], MD   7 months ago Hypertension associated with diabetes [facility])   Primary Care at [facility], Ines Bloomer, MD   8 months ago Encounter for general adult medical examination with abnormal findings   Primary Care at [facility], Ines Bloomer, MD   9 months ago Annual physical exam   Primary Care at [facility], Ines Bloomer, [place]            In 3 months [place], Ines Bloomer, MD Primary Care at [place], [facility]   In 4 months [place], Ines Bloomer, MD Primary Care at [place], [facility]           . metFORMIN (GLUCOPHAGE-XR) 500 MG 24 hr tablet [Pharmacy Med Name: METFORMIN HCL ER 500 MG TABLET] 90 tablet 3    Sig: TAKE 1 TABLET ONCE DAILY WITH BREAKFAST.     Endocrinology:  Diabetes - Biguanides Passed - 10/26/2020  1:23 PM      Passed - Cr in normal range and within 360 days    Creatinine, Ser  Date Value Ref Range Status  09/23/2020 1.18 0.76 - 1.27 mg/dL Final         Passed - HBA1C is between 0 and 7.9 and within 180 days    Hemoglobin A1C  Date Value Ref Range Status  09/23/2020 7.0 (A) 4.0 - 5.6 % Final   Hgb A1c MFr Bld  Date Value Ref Range Status  01/29/2020 6.8 (H) 4.8 - 5.6 % Final    Comment:             Prediabetes: 5.7 - 6.4          Diabetes: >6.4          Glycemic control for adults with diabetes: <7.0          Passed - AA eGFR in normal range and within 360 days    GFR calc Af Amer  Date Value Ref Range Status  09/23/2020 74 >59 mL/min/1.73 Final  Comment:    **In accordance with recommendations from the NKF-ASN Task force,**   [facility] is in the process of updating its eGFR calculation to the   2021 CKD-EPI creatinine equation that estimates kidney function   without a race variable.    GFR calc non Af Amer  Date Value Ref Range Status  09/23/2020 64 >59 mL/min/1.73 Final         Passed - Valid encounter within last 6 months    Recent Outpatient Visits          1 month ago Hypertension associated with diabetes [facility])   [facility], Ines Bloomer, MD   1 month ago COVID-19 virus infection    Primary Care at [place], [place], MD   7 months ago Hypertension associated with diabetes [facility])   Primary Care at [facility], Ines Bloomer, MD   8 months ago Encounter for general adult medical examination with abnormal findings   Primary Care at [facility], Ines Bloomer, MD   9 months ago Annual physical exam   Primary Care at [facility], Ines Bloomer, [place]            In 3 months [place], Ines Bloomer, MD Primary Care at [place], [facility]   In 4 months [place], Ines Bloomer, MD Primary Care at [place], [facility]           . glipiZIDE (GLUCOTROL) 5 MG tablet [Pharmacy Med Name: GLIPIZIDE 5 MG TABLET] 30 tablet     Sig: TAKE 1 TABLET ONCE A DAY BEFORE BREAKFAST.     Endocrinology:  Diabetes - Sulfonylureas Passed - 10/26/2020  1:23 PM      Passed - HBA1C is between 0 and 7.9 and within 180 days    Hemoglobin A1C  Date Value Ref Range Status  09/23/2020 7.0 (A) 4.0 - 5.6 % Final   Hgb A1c MFr Bld  Date Value Ref Range Status  01/29/2020 6.8 (H) 4.8 - 5.6 % Final    Comment:             Prediabetes: 5.7 - 6.4          Diabetes: >6.4          Glycemic control for adults with diabetes: <7.0          Passed - Valid encounter within last 6 months    Recent Outpatient Visits          1 month ago Hypertension associated with diabetes [facility])   Primary Care at [facility], Ines Bloomer, MD   1 month ago COVID-19 virus infection   Primary Care at [place], [place], MD   7 months ago Hypertension associated with diabetes [facility])   Primary Care at [facility], Ines Bloomer, MD   8 months ago Encounter for general adult medical examination with abnormal findings   Primary Care at [facility], Ines Bloomer, MD   9 months ago Annual physical exam   Primary Care at [facility], Ines Bloomer, MD      Future Appointments            In 3 months [place], Ines Bloomer, MD Primary Care at [place], [facility]   In 4 months [place], Ines Bloomer, MD Primary Care at [place], [facility]

## 2020-10-27 ENCOUNTER — Other Ambulatory Visit: Payer: Self-pay | Admitting: Emergency Medicine

## 2020-10-27 DIAGNOSIS — I10 Essential (primary) hypertension: Secondary | ICD-10-CM

## 2020-10-27 DIAGNOSIS — E785 Hyperlipidemia, unspecified: Secondary | ICD-10-CM

## 2020-10-28 ENCOUNTER — Other Ambulatory Visit: Payer: Self-pay | Admitting: Emergency Medicine

## 2020-10-28 DIAGNOSIS — I10 Essential (primary) hypertension: Secondary | ICD-10-CM

## 2020-10-28 NOTE — Telephone Encounter (Signed)
  Notes to clinic: Looks like only 6 pills were sent to pharmacy Review for 30 days worth   Requested Prescriptions  Pending Prescriptions Disp Refills   amLODipine (NORVASC) 5 MG tablet [Pharmacy Med Name: AMLODIPINE BESYLATE 5 MG TAB] 30 tablet 0    Sig: TAKE (1) TABLET DAILY IN THE MORNING.      Cardiovascular:  Calcium Channel Blockers Passed - 10/28/2020  8:51 AM      Passed - Last BP in normal range    BP Readings from Last 1 Encounters:  09/23/20 128/82          Passed - Valid encounter within last 6 months    Recent Outpatient Visits           1 month ago Hypertension associated with diabetes [facility])   Primary Care at [facility], Ines Bloomer, MD   1 month ago COVID-19 virus infection   Primary Care at [place], [place], MD   7 months ago Hypertension associated with diabetes [facility])   [facility] at [facility], Ines Bloomer, MD   8 months ago Encounter for general adult medical examination with abnormal findings   Primary Care at [facility], Ines Bloomer, MD   9 months ago Annual physical exam   Primary Care at [facility], Ines Bloomer, [place]             In 3 months [place], Ines Bloomer, MD Primary Care at [place], [facility]   In 4 months [place], Ines Bloomer, MD Primary Care at [place], [facility]

## 2020-10-28 NOTE — Telephone Encounter (Signed)
Requested Prescriptions  Pending Prescriptions Disp Refills  . glipiZIDE (GLUCOTROL) 5 MG tablet [Pharmacy Med Name: GLIPIZIDE 5 MG TABLET] 30 tablet 0    Sig: TAKE 1 TABLET ONCE A DAY BEFORE BREAKFAST.     Endocrinology:  Diabetes - Sulfonylureas Passed - 10/27/2020  6:38 PM      Passed - HBA1C is between 0 and 7.9 and within 180 days    Hemoglobin A1C  Date Value Ref Range Status  09/23/2020 7.0 (A) 4.0 - 5.6 % Final   Hgb A1c MFr Bld  Date Value Ref Range Status  01/29/2020 6.8 (H) 4.8 - 5.6 % Final    Comment:             Prediabetes: 5.7 - 6.4          Diabetes: >6.4          Glycemic control for adults with diabetes: <7.0          Passed - Valid encounter within last 6 months    Recent Outpatient Visits          1 month ago Hypertension associated with diabetes [facility])   Primary Care at [facility], Ines Bloomer, MD   1 month ago COVID-19 virus infection   Primary Care at [place], [place], MD   7 months ago Hypertension associated with diabetes [facility])   Primary Care at [facility], Ines Bloomer, MD   8 months ago Encounter for general adult medical examination with abnormal findings   Primary Care at [facility], Ines Bloomer, MD   9 months ago Annual physical exam   Primary Care at [facility], Ines Bloomer, MD      Future Appointments            In 3 months [place], Ines Bloomer, MD Primary Care at [place], [facility]   In 4 months [place], Ines Bloomer, MD Primary Care at [place], [place]           . amLODipine ([place]) 5 MG tablet [Pharmacy Med Name: AMLODIPINE BESYLATE 5 MG TAB] 6 tablet 0    Sig: TAKE 1 TABLET BY MOUTH EVERY MORNING.     Cardiovascular:  Calcium Channel Blockers Passed - 10/27/2020  6:38 PM      Passed - Last BP in normal range    BP Readings from Last 1 Encounters:  09/23/20 128/82         Passed - Valid encounter within last 6 months    Recent Outpatient Visits          1 month ago Hypertension associated with diabetes  [facility])   Primary Care at [facility], Ines Bloomer, MD   1 month ago COVID-19 virus infection   Primary Care at [place], [place], MD   7 months ago Hypertension associated with diabetes [facility])   [facility], Ines Bloomer, MD   8 months ago Encounter for general adult medical examination with abnormal findings   Primary Care at [facility], Ines Bloomer, MD   9 months ago Annual physical exam   Primary Care at [facility], Ines Bloomer, [place]            In 3 months [place], Ines Bloomer, MD Primary Care at [place], [facility]   In 4 months [place], Ines Bloomer, MD Primary Care at [place], [facility]           . rosuvastatin (CRESTOR) 5 MG tablet [Pharmacy Med Name:  ROSUVASTATIN CALCIUM 5 MG TAB] 30 tablet 0    Sig: TAKE 1 TABLET ONCE DAILY.     Cardiovascular:  Antilipid - Statins Failed - 10/27/2020  6:38 PM      Failed - LDL in normal range and within 360 days    LDL Chol Calc (NIH)  Date Value Ref Range Status  01/29/2020 80 0 - 99 mg/dL Final         Passed - Total Cholesterol in normal range and within 360 days    Cholesterol, Total  Date Value Ref Range Status  01/29/2020 153 100 - 199 mg/dL Final         Passed - HDL in normal range and within 360 days    HDL  Date Value Ref Range Status  01/29/2020 59 >39 mg/dL Final         Passed - Triglycerides in normal range and within 360 days    Triglycerides  Date Value Ref Range Status  01/29/2020 72 0 - 149 mg/dL Final         Passed - Patient is not pregnant      Passed - Valid encounter within last 12 months    Recent Outpatient Visits          1 month ago Hypertension associated with diabetes [facility])   Primary Care at [facility], Ines Bloomer, MD   1 month ago COVID-19 virus infection   Primary Care at [place], [place], MD   7 months ago Hypertension associated with diabetes [facility])   Primary Care at [facility], Ines Bloomer, MD   8 months ago  Encounter for general adult medical examination with abnormal findings   Primary Care at [facility], Ines Bloomer, MD   9 months ago Annual physical exam   Primary Care at [facility], Ines Bloomer, MD      Future Appointments            In 3 months [place], Ines Bloomer, MD Primary Care at [place], [facility]   In 4 months [place], Ines Bloomer, MD Primary Care at [place], [facility]

## 2020-11-02 ENCOUNTER — Telehealth: Payer: Self-pay | Admitting: Emergency Medicine

## 2020-11-02 DIAGNOSIS — I10 Essential (primary) hypertension: Secondary | ICD-10-CM

## 2020-11-02 NOTE — Telephone Encounter (Signed)
What is the name of the medication? amLODipine (NORVASC) 5 MG tablet [[id number]]   Have you contacted your pharmacy to request a refill? This script was written for 6 pills. Pt thinks it should have been written for more. He has one pill left.   Which pharmacy would you like this sent to? Pharmacy  [place], [place]  [address] [place], [place] [address]  Phone:  [phone] Fax:  [phone]      Patient notified that their request is being sent to the clinical staff for review and that they should receive a call once it is complete. If they do not receive a call within 72 hours they can check with their pharmacy or our office.

## 2020-11-03 MED ORDER — AMLODIPINE BESYLATE 5 MG PO TABS: 5.0000 mg | ORAL_TABLET | Freq: Every morning | ORAL | 0 refills | Status: DC

## 2020-11-03 NOTE — Telephone Encounter (Signed)
Sent for 90 days

## 2020-11-30 ENCOUNTER — Other Ambulatory Visit: Payer: Self-pay | Admitting: Emergency Medicine

## 2020-12-30 ENCOUNTER — Other Ambulatory Visit: Payer: Self-pay | Admitting: Emergency Medicine

## 2020-12-30 DIAGNOSIS — N529 Male erectile dysfunction, unspecified: Secondary | ICD-10-CM

## 2021-02-02 ENCOUNTER — Encounter: Payer: 59 | Admitting: Emergency Medicine

## 2021-02-03 ENCOUNTER — Other Ambulatory Visit: Payer: Self-pay | Admitting: Family Medicine

## 2021-02-03 ENCOUNTER — Other Ambulatory Visit: Payer: Self-pay | Admitting: Emergency Medicine

## 2021-02-03 DIAGNOSIS — E785 Hyperlipidemia, unspecified: Secondary | ICD-10-CM

## 2021-02-03 DIAGNOSIS — I10 Essential (primary) hypertension: Secondary | ICD-10-CM

## 2021-02-03 DIAGNOSIS — N529 Male erectile dysfunction, unspecified: Secondary | ICD-10-CM

## 2021-02-15 ENCOUNTER — Encounter: Payer: Self-pay | Admitting: Emergency Medicine

## 2021-02-15 ENCOUNTER — Ambulatory Visit (INDEPENDENT_AMBULATORY_CARE_PROVIDER_SITE_OTHER): Payer: BLUE CROSS/BLUE SHIELD | Admitting: Emergency Medicine

## 2021-02-15 ENCOUNTER — Other Ambulatory Visit: Payer: Self-pay

## 2021-02-15 VITALS — BP 142/98 | HR 83 | Temp 98.6°F | Ht 69.5 in | Wt 185.6 lb

## 2021-02-15 DIAGNOSIS — K5790 Diverticulosis of intestine, part unspecified, without perforation or abscess without bleeding: Secondary | ICD-10-CM | POA: Diagnosis not present

## 2021-02-15 DIAGNOSIS — Z125 Encounter for screening for malignant neoplasm of prostate: Secondary | ICD-10-CM

## 2021-02-15 DIAGNOSIS — R490 Dysphonia: Secondary | ICD-10-CM

## 2021-02-15 DIAGNOSIS — E785 Hyperlipidemia, unspecified: Secondary | ICD-10-CM | POA: Diagnosis not present

## 2021-02-15 DIAGNOSIS — I10 Essential (primary) hypertension: Secondary | ICD-10-CM

## 2021-02-15 DIAGNOSIS — I152 Hypertension secondary to endocrine disorders: Secondary | ICD-10-CM

## 2021-02-15 DIAGNOSIS — E1159 Type 2 diabetes mellitus with other circulatory complications: Secondary | ICD-10-CM

## 2021-02-15 DIAGNOSIS — I350 Nonrheumatic aortic (valve) stenosis: Secondary | ICD-10-CM | POA: Insufficient documentation

## 2021-02-15 DIAGNOSIS — E1169 Type 2 diabetes mellitus with other specified complication: Secondary | ICD-10-CM | POA: Diagnosis not present

## 2021-02-15 DIAGNOSIS — Z0001 Encounter for general adult medical examination with abnormal findings: Secondary | ICD-10-CM

## 2021-02-15 LAB — CBC WITH DIFFERENTIAL/PLATELET
Basophils Absolute: 0.1 10*3/uL (ref 0.0–0.1)
Basophils Relative: 1.1 % (ref 0.0–3.0)
Eosinophils Absolute: 0.2 10*3/uL (ref 0.0–0.7)
Eosinophils Relative: 3.1 % (ref 0.0–5.0)
HCT: 43.2 % (ref 39.0–52.0)
Hemoglobin: 14.5 g/dL (ref 13.0–17.0)
Lymphocytes Relative: 26.2 % (ref 12.0–46.0)
Lymphs Abs: 1.3 10*3/uL (ref 0.7–4.0)
MCHC: 33.5 g/dL (ref 30.0–36.0)
MCV: 92.9 fl (ref 78.0–100.0)
Monocytes Absolute: 0.5 10*3/uL (ref 0.1–1.0)
Monocytes Relative: 10.2 % (ref 3.0–12.0)
Neutro Abs: 2.9 10*3/uL (ref 1.4–7.7)
Neutrophils Relative %: 59.4 % (ref 43.0–77.0)
Platelets: 220 10*3/uL (ref 150.0–400.0)
RBC: 4.65 Mil/uL (ref 4.22–5.81)
RDW: 12.9 % (ref 11.5–15.5)
WBC: 4.9 10*3/uL (ref 4.0–10.5)

## 2021-02-15 LAB — LIPID PANEL
Cholesterol: 147 mg/dL (ref 0–200)
HDL: 58.6 mg/dL (ref >39.00)
LDL Cholesterol: 75 mg/dL (ref 0–99)
NonHDL: 88.18
Total CHOL/HDL Ratio: 3
Triglycerides: 67 mg/dL (ref 0.0–149.0)
VLDL: 13.4 mg/dL (ref 0.0–40.0)

## 2021-02-15 LAB — POCT GLYCOSYLATED HEMOGLOBIN (HGB A1C): Hemoglobin A1C: 6.9 % — AB (ref 4.0–5.6)

## 2021-02-15 LAB — COMPREHENSIVE METABOLIC PANEL
ALT: 14 U/L (ref 0–53)
AST: 15 U/L (ref 0–37)
Albumin: 4.3 g/dL (ref 3.5–5.2)
Alkaline Phosphatase: 44 U/L (ref 39–117)
BUN: 18 mg/dL (ref 6–23)
CO2: 30 mEq/L (ref 19–32)
Calcium: 9.5 mg/dL (ref 8.4–10.5)
Chloride: 105 mEq/L (ref 96–112)
Creatinine, Ser: 1.18 mg/dL (ref 0.40–1.50)
GFR: 64.43 mL/min (ref >60.00)
Glucose, Bld: 135 mg/dL — ABNORMAL HIGH (ref 70–99)
Potassium: 4.9 mEq/L (ref 3.5–5.1)
Sodium: 141 mEq/L (ref 135–145)
Total Bilirubin: 0.5 mg/dL (ref 0.2–1.2)
Total Protein: 6.5 g/dL (ref 6.0–8.3)

## 2021-02-15 LAB — PSA: PSA: 1.39 ng/mL (ref 0.10–4.00)

## 2021-02-15 MED ORDER — LISINOPRIL 20 MG PO TABS: 1.0000 | ORAL_TABLET | Freq: Every day | ORAL | 3 refills | Status: DC

## 2021-02-15 MED ORDER — METFORMIN HCL ER 500 MG PO TB24: ORAL_TABLET | ORAL | 3 refills | Status: DC

## 2021-02-15 MED ORDER — GLIPIZIDE 5 MG PO TABS
5.0000 mg | ORAL_TABLET | Freq: Every day | ORAL | 3 refills | Status: DC
Start: 2021-02-15 — End: 2021-05-10

## 2021-02-15 MED ORDER — ROSUVASTATIN CALCIUM 5 MG PO TABS: 5.0000 mg | ORAL_TABLET | Freq: Every day | ORAL | 3 refills | Status: DC

## 2021-02-15 MED ORDER — AMLODIPINE BESYLATE 5 MG PO TABS: 1.0000 | ORAL_TABLET | Freq: Every morning | ORAL | 3 refills | Status: DC

## 2021-02-15 NOTE — Patient Instructions (Signed)
Health Maintenance, Male Adopting a healthy lifestyle and getting preventive care are important in promoting health and wellness. Ask your health care provider about: The right schedule for you to have regular tests and exams. Things you can do on your own to prevent diseases and keep yourself healthy. What should I know about diet, weight, and exercise? Eat a healthy diet  Eat a diet that includes plenty of vegetables, fruits, low-fat dairy products, and lean protein. Do not eat a lot of foods that are high in solid fats, added sugars, or sodium.  Maintain a healthy weight Body mass index (BMI) is a measurement that can be used to identify possible weight problems. It estimates body fat based on height and weight. Your health care provider can help determine your BMI and help you achieve or maintain ahealthy weight. Get regular exercise Get regular exercise. This is one of the most important things you can do for your health. Most adults should: Exercise for at least 150 minutes each week. The exercise should increase your heart rate and make you sweat (moderate-intensity exercise). Do strengthening exercises at least twice a week. This is in addition to the moderate-intensity exercise. Spend less time sitting. Even light physical activity can be beneficial. Watch cholesterol and blood lipids Have your blood tested for lipids and cholesterol at 66 years of age, then havethis test every 5 years. You may need to have your cholesterol levels checked more often if: Your lipid or cholesterol levels are high. You are older than 66 years of age. You are at high risk for heart disease. What should I know about cancer screening? Many types of cancers can be detected early and may often be prevented. Depending on your health history and family history, you may need to have cancer screening at various ages. This may include screening for: Colorectal cancer. Prostate cancer. Skin cancer. Lung  cancer. What should I know about heart disease, diabetes, and high blood pressure? Blood pressure and heart disease High blood pressure causes heart disease and increases the risk of stroke. This is more likely to develop in people who have high blood pressure readings, are of African descent, or are overweight. Talk with your health care provider about your target blood pressure readings. Have your blood pressure checked: Every 3-5 years if you are 18-39 years of age. Every year if you are 40 years old or older. If you are between the ages of 65 and 75 and are a current or former smoker, ask your health care provider if you should have a one-time screening for abdominal aortic aneurysm (AAA). Diabetes Have regular diabetes screenings. This checks your fasting blood sugar level. Have the screening done: Once every three years after age 45 if you are at a normal weight and have a low risk for diabetes. More often and at a younger age if you are overweight or have a high risk for diabetes. What should I know about preventing infection? Hepatitis B If you have a higher risk for hepatitis B, you should be screened for this virus. Talk with your health care provider to find out if you are at risk forhepatitis B infection. Hepatitis C Blood testing is recommended for: Everyone born from 1945 through 1965. Anyone with known risk factors for hepatitis C. Sexually transmitted infections (STIs) You should be screened each year for STIs, including gonorrhea and chlamydia, if: You are sexually active and are younger than 66 years of age. You are older than 66 years of age   and your health care provider tells you that you are at risk for this type of infection. Your sexual activity has changed since you were last screened, and you are at increased risk for chlamydia or gonorrhea. Ask your health care provider if you are at risk. Ask your health care provider about whether you are at high risk for HIV.  Your health care provider may recommend a prescription medicine to help prevent HIV infection. If you choose to take medicine to prevent HIV, you should first get tested for HIV. You should then be tested every 3 months for as long as you are taking the medicine. Follow these instructions at home: Lifestyle Do not use any products that contain nicotine or tobacco, such as cigarettes, e-cigarettes, and chewing tobacco. If you need help quitting, ask your health care provider. Do not use street drugs. Do not share needles. Ask your health care provider for help if you need support or information about quitting drugs. Alcohol use Do not drink alcohol if your health care provider tells you not to drink. If you drink alcohol: Limit how much you have to 0-2 drinks a day. Be aware of how much alcohol is in your drink. In the [place] one drink equals one 12 oz bottle of beer (355 mL), one 5 oz glass of wine (148 mL), or one 1 oz glass of hard liquor (44 mL). General instructions Schedule regular health, dental, and eye exams. Stay current with your vaccines. Tell your health care provider if: You often feel depressed. You have ever been abused or do not feel safe at home. Summary Adopting a healthy lifestyle and getting preventive care are important in promoting health and wellness. Follow your health care provider's instructions about healthy diet, exercising, and getting tested or screened for diseases. Follow your health care provider's instructions on monitoring your cholesterol and blood pressure. This information is not intended to replace advice given to you by your health care provider. Make sure you discuss any questions you have with your healthcare provider. Document Revised: 08/08/2018 Document Reviewed: 08/08/2018 Elsevier Patient Education  2022 [employer]  

## 2021-02-15 NOTE — Progress Notes (Signed)
Jacob Chan 65 y.o.   Chief Complaint  Patient presents with   Annual Exam    HISTORY OF PRESENT ILLNESS: This is a 65 y.o. male here for his annual exam. Has history of hypertension on amlodipine 5 mg and lisinopril 20 mg daily. History of diabetes on 5 mg of glipizide daily and metformin 500 mg daily. History of dyslipidemia on Crestor 5 mg daily. Has history of chronic hoarseness.  Evaluated by ENT many years ago. Recent echocardiogram done 7/23 2021 with ejection fraction between 60 and 65% and mild to moderate aortic valve stenosis.  Patient has a history of systolic murmur. Colonoscopy done on 12/10/2019 showed no cancer, diffuse diverticulosis and a 8 mm polyp in the cecum. Had COVID infection last January still struggling with some occasional breathing issues. No other complaints or medical concerns today.  HPI   Prior to Admission medications   Medication Sig Start Date End Date Taking? Authorizing Provider  acetaminophen (TYLENOL) 500 MG tablet Take 1,000 mg by mouth every 6 (six) hours as needed. PAIN   Yes [provider]  amLODipine (NORVASC) 5 MG tablet TAKE 1 TABLET BY MOUTH EVERY MORNING. 02/03/21  Yes Kay Shippy, Ines Bloomer, MD  ascorbic acid (VITAMIN C) 500 MG tablet Take 500 mg by mouth daily.   Yes [provider]  aspirin EC 81 MG tablet Take 81 mg by mouth daily.   Yes [provider]  diclofenac sodium (VOLTAREN) 1 % GEL Apply 2 g topically 2 (two) times daily. 02/18/19  Yes Coren Crownover, Ines Bloomer, MD  DM-APAP-CPM (CORICIDIN HBP PO) Take by mouth as needed.   Yes [provider]  glipiZIDE (GLUCOTROL) 5 MG tablet Take 1 tablet (5 mg total) by mouth daily before breakfast. 11/30/20 02/28/21 Yes Yassmine Tamm, Ines Bloomer, MD  lisinopril (ZESTRIL) 20 MG tablet TAKE 1 TABLET ONCE DAILY. 10/26/20  Yes Horald Pollen, MD  metFORMIN (GLUCOPHAGE-XR) 500 MG 24 hr tablet TAKE 1 TABLET ONCE DAILY WITH BREAKFAST. 10/26/20  Yes Gad Aymond,  Ines Bloomer, MD  Multiple Vitamins-Minerals (CENTRUM SILVER PO) Take 1 tablet by mouth daily.   Yes [provider]  rosuvastatin (CRESTOR) 5 MG tablet TAKE 1 TABLET ONCE DAILY. 02/03/21  Yes Ettore Trebilcock, Ines Bloomer, MD  sildenafil (REVATIO) 20 MG tablet TAKE 2 TABLETS APPROXIMATELY 30 TO 60 MINUTES BEFORE INTERCOURSE. 02/03/21  Yes Duvid Smalls, Ines Bloomer, MD  benzonatate (TESSALON) 100 MG capsule Take 1-2 capsules (100-200 mg total) by mouth 3 (three) times daily as needed. Patient not taking: Reported on 02/15/2021 09/14/20   Jaynee Eagles, PA-C  cetirizine (ZYRTEC ALLERGY) 10 MG tablet Take 1 tablet (10 mg total) by mouth daily. Patient not taking: Reported on 02/15/2021 09/14/20   Jaynee Eagles, PA-C  naproxen (NAPROSYN) 500 MG tablet Take 1 tablet (500 mg total) by mouth 2 (two) times daily. Patient not taking: Reported on 02/15/2021 09/22/20   Hazel Sams, PA-C  predniSONE (DELTASONE) 10 MG tablet Take 3 tablets (30 mg total) by mouth daily with breakfast. Patient not taking: Reported on 02/15/2021 09/14/20   Jaynee Eagles, PA-C  promethazine-dextromethorphan (PROMETHAZINE-DM) 6.25-15 MG/5ML syrup Take 5 mLs by mouth at bedtime as needed for cough. Patient not taking: Reported on 02/15/2021 09/14/20   Jaynee Eagles, PA-C    Not on File  Patient Active Problem List   Diagnosis Date Noted   Hypertension associated with diabetes ([place]) 12/20/2016   Dyslipidemia associated with type 2 diabetes mellitus ([place]) 12/20/2016    Past Medical History:  Diagnosis  Date   Chronic kidney disease    many kidney stones   Diabetes mellitus    Diabetes mellitus without complication ([place])    [employer] 09/09/2020   Hypertension     Past Surgical History:  Procedure Laterality Date   LITHOTRIPSY     3 yrs ago    Social History   Socioeconomic History   Marital status: Married    Spouse name: [name]   Number of children: 4   Years of education: Not on file   Highest education level: Not on file   Occupational History   Not on file  Tobacco Use   Smoking status: Never   Smokeless tobacco: Never  Vaping Use   Vaping Use: Never used  Substance and Sexual Activity   Alcohol use: No   Drug use: No   Sexual activity: Yes  Other Topics Concern   Not on file  Social History Narrative   Not on file   Social Determinants of Health   Financial Resource Strain: Not on file  Food Insecurity: Not on file  Transportation Needs: Not on file  Physical Activity: Not on file  Stress: Not on file  Social Connections: Not on file  Intimate Partner Violence: Not on file    Family History  Problem Relation Age of Onset   Diabetes Mother    Heart disease Mother    Diabetes Father    Colon cancer Neg Hx    Esophageal cancer Neg Hx    Stomach cancer Neg Hx    Rectal cancer Neg Hx      Review of Systems  Constitutional:  Negative for chills and fever.  HENT: Negative.  Negative for congestion and sore throat.        Chronic hoarseness  Respiratory:  Negative for cough and shortness of breath (Some dyspnea on exertion).   Cardiovascular:  Negative for chest pain and palpitations.  Gastrointestinal: Negative.  Negative for abdominal pain, blood in stool, diarrhea, melena, nausea and vomiting.  Genitourinary: Negative.  Negative for dysuria and hematuria.       Urinates once per night  Skin: Negative.  Negative for rash.  Neurological:  Negative for dizziness and headaches.  All other systems reviewed and are negative.  Today's Vitals   02/15/21 0810  BP: (!) 142/98  Pulse: 83  Temp: 98.6 F (37 C)  TempSrc: Oral  SpO2: 96%  Weight: 185 lb 9.6 oz (84.2 kg)  Height: 5' 9.5" (1.765 m)   Body mass index is 27.02 kg/m. Wt Readings from Last 3 Encounters:  02/15/21 185 lb 9.6 oz (84.2 kg)  09/23/20 175 lb (79.4 kg)  09/09/20 185 lb (83.9 kg)    Physical Exam Vitals reviewed.  Constitutional:      Appearance: Normal appearance.  HENT:     Head: Normocephalic.      Right Ear: Tympanic membrane, ear canal and external ear normal.     Left Ear: Tympanic membrane, ear canal and external ear normal.     Mouth/Throat:     Mouth: Mucous membranes are moist.     Pharynx: Oropharynx is clear.  Eyes:     Extraocular Movements: Extraocular movements intact.     Conjunctiva/sclera: Conjunctivae normal.     Pupils: Pupils are equal, round, and reactive to light.  Cardiovascular:     Rate and Rhythm: Normal rate and regular rhythm.     Pulses: Normal pulses.     Heart sounds: Murmur (Systolic aortic 3/6) heard.  Pulmonary:  Effort: Pulmonary effort is normal.     Breath sounds: Normal breath sounds.  Abdominal:     General: Bowel sounds are normal. There is no distension.     Palpations: Abdomen is soft.     Tenderness: There is no abdominal tenderness.  Musculoskeletal:        General: Normal range of motion.     Cervical back: Normal range of motion and neck supple. No rigidity.  Lymphadenopathy:     Cervical: No cervical adenopathy.  Skin:    General: Skin is warm and dry.     Capillary Refill: Capillary refill takes less than 2 seconds.  Neurological:     General: No focal deficit present.     Mental Status: He is alert and oriented to person, place, and time.  Psychiatric:        Mood and Affect: Mood normal.        Behavior: Behavior normal.   Results for orders placed or performed in visit on 02/15/21 (from the past 24 hour(s))  POCT glycosylated hemoglobin (Hb A1C)     Status: Abnormal   Collection Time: 02/15/21  8:30 AM  Result Value Ref Range   Hemoglobin A1C 6.9 (A) 4.0 - 5.6 %   HbA1c POC (<> result, manual entry)     HbA1c, POC (prediabetic range)     HbA1c, POC (controlled diabetic range)       ASSESSMENT & PLAN: Elizeo was seen today for annual exam and medication refill.  Diagnoses and all orders for this visit:  Encounter for general adult medical examination with abnormal findings  Hypertension associated with  diabetes ([place]) -     lisinopril (ZESTRIL) 20 MG tablet; Take 1 tablet (20 mg total) by mouth daily. -     metFORMIN (GLUCOPHAGE-XR) 500 MG 24 hr tablet; TAKE 1 TABLET ONCE DAILY WITH BREAKFAST. -     POCT glycosylated hemoglobin (Hb A1C) -     CBC with Differential/Platelet -     Comprehensive metabolic panel  Dyslipidemia associated with type 2 diabetes mellitus ([facility]) -     Lipid panel  Diverticulosis  Essential hypertension -     amLODipine (NORVASC) 5 MG tablet; Take 1 tablet (5 mg total) by mouth every morning.  Hyperlipidemia, unspecified hyperlipidemia type -     rosuvastatin (CRESTOR) 5 MG tablet; Take 1 tablet (5 mg total) by mouth daily.  Prostate cancer screening -     PSA  Chronic hoarseness -     Ambulatory referral to ENT  Nonrheumatic aortic valve stenosis  Other orders -     glipiZIDE (GLUCOTROL) 5 MG tablet; Take 1 tablet (5 mg total) by mouth daily before breakfast.  Modifiable risk factors discussed with patient. Anticipatory guidance according to age provided. The following topics were discussed: Diabetes, medications, diet and nutrition, and A1c result. Smoking Chronic hoarseness and need for ENT evaluation Diet and nutrition in particular reducing amount of daily carbohydrates Benefits of exercise Cancer screening and review of recent colonoscopy report.  No family history of prostate cancer that he knows of. Vaccinations Cardiovascular risk assessment and review of most recent echocardiogram done last year Mental health including depression, anxiety, and mind/body connection Fall and accident prevention  Patient Instructions  Health Maintenance, Male Adopting a healthy lifestyle and getting preventive care are important in promoting health and wellness. Ask your health care provider about: The right schedule for you to have regular tests and exams. Things you can do on your own to  prevent diseases and keep yourself healthy. What should I know  about diet, weight, and exercise? Eat a healthy diet  Eat a diet that includes plenty of vegetables, fruits, low-fat dairy products, and lean protein. Do not eat a lot of foods that are high in solid fats, added sugars, or sodium.  Maintain a healthy weight Body mass index (BMI) is a measurement that can be used to identify possible weight problems. It estimates body fat based on height and weight. Your health care provider can help determine your BMI and help you achieve or maintain ahealthy weight. Get regular exercise Get regular exercise. This is one of the most important things you can do for your health. Most adults should: Exercise for at least 150 minutes each week. The exercise should increase your heart rate and make you sweat (moderate-intensity exercise). Do strengthening exercises at least twice a week. This is in addition to the moderate-intensity exercise. Spend less time sitting. Even light physical activity can be beneficial. Watch cholesterol and blood lipids Have your blood tested for lipids and cholesterol at 66 years of age, then havethis test every 5 years. You may need to have your cholesterol levels checked more often if: Your lipid or cholesterol levels are high. You are older than 66 years of age. You are at high risk for heart disease. What should I know about cancer screening? Many types of cancers can be detected early and may often be prevented. Depending on your health history and family history, you may need to have cancer screening at various ages. This may include screening for: Colorectal cancer. Prostate cancer. Skin cancer. Lung cancer. What should I know about heart disease, diabetes, and high blood pressure? Blood pressure and heart disease High blood pressure causes heart disease and increases the risk of stroke. This is more likely to develop in people who have high blood pressure readings, are of African descent, or are overweight. Talk with your  health care provider about your target blood pressure readings. Have your blood pressure checked: Every 3-5 years if you are 21-16 years of age. Every year if you are 79 years old or older. If you are between the ages of 65 and 46 and are a current or former smoker, ask your health care provider if you should have a one-time screening for abdominal aortic aneurysm (AAA). Diabetes Have regular diabetes screenings. This checks your fasting blood sugar level. Have the screening done: Once every three years after age 58 if you are at a normal weight and have a low risk for diabetes. More often and at a younger age if you are overweight or have a high risk for diabetes. What should I know about preventing infection? Hepatitis B If you have a higher risk for hepatitis B, you should be screened for this virus. Talk with your health care provider to find out if you are at risk forhepatitis B infection. Hepatitis C Blood testing is recommended for: Everyone born from [birth year] through 1966. Anyone with known risk factors for hepatitis C. Sexually transmitted infections (STIs) You should be screened each year for STIs, including gonorrhea and chlamydia, if: You are sexually active and are younger than 66 years of age. You are older than 66 years of age and your health care provider tells you that you are at risk for this type of infection. Your sexual activity has changed since you were last screened, and you are at increased risk for chlamydia or gonorrhea. Ask your health care provider  if you are at risk. Ask your health care provider about whether you are at high risk for HIV. Your health care provider may recommend a prescription medicine to help prevent HIV infection. If you choose to take medicine to prevent HIV, you should first get tested for HIV. You should then be tested every 3 months for as long as you are taking the medicine. Follow these instructions at home: Lifestyle Do not use any products  that contain nicotine or tobacco, such as cigarettes, e-cigarettes, and chewing tobacco. If you need help quitting, ask your health care provider. Do not use street drugs. Do not share needles. Ask your health care provider for help if you need support or information about quitting drugs. Alcohol use Do not drink alcohol if your health care provider tells you not to drink. If you drink alcohol: Limit how much you have to 0-2 drinks a day. Be aware of how much alcohol is in your drink. In the [place] one drink equals one 12 oz bottle of beer (355 mL), one 5 oz glass of wine (148 mL), or one 1 oz glass of hard liquor (44 mL). General instructions Schedule regular health, dental, and eye exams. Stay current with your vaccines. Tell your health care provider if: You often feel depressed. You have ever been abused or do not feel safe at home. Summary Adopting a healthy lifestyle and getting preventive care are important in promoting health and wellness. Follow your health care provider's instructions about healthy diet, exercising, and getting tested or screened for diseases. Follow your health care provider's instructions on monitoring your cholesterol and blood pressure. This information is not intended to replace advice given to you by your health care provider. Make sure you discuss any questions you have with your healthcare provider. Document Revised: 08/08/2018 Document Reviewed: 08/08/2018 Elsevier Patient Education  2022 [place], [place] [facility]

## 2021-02-25 ENCOUNTER — Encounter: Payer: BLUE CROSS/BLUE SHIELD | Admitting: Emergency Medicine

## 2021-03-08 ENCOUNTER — Other Ambulatory Visit: Payer: Self-pay | Admitting: Emergency Medicine

## 2021-03-08 DIAGNOSIS — I10 Essential (primary) hypertension: Secondary | ICD-10-CM

## 2021-03-08 DIAGNOSIS — E785 Hyperlipidemia, unspecified: Secondary | ICD-10-CM

## 2021-03-18 ENCOUNTER — Ambulatory Visit: Payer: BLUE CROSS/BLUE SHIELD | Admitting: Emergency Medicine

## 2021-04-07 ENCOUNTER — Other Ambulatory Visit: Payer: Self-pay | Admitting: Emergency Medicine

## 2021-04-07 DIAGNOSIS — N529 Male erectile dysfunction, unspecified: Secondary | ICD-10-CM

## 2021-05-10 ENCOUNTER — Other Ambulatory Visit: Payer: Self-pay | Admitting: Emergency Medicine

## 2021-05-10 DIAGNOSIS — I152 Hypertension secondary to endocrine disorders: Secondary | ICD-10-CM

## 2021-05-10 DIAGNOSIS — E1159 Type 2 diabetes mellitus with other circulatory complications: Secondary | ICD-10-CM

## 2021-06-07 ENCOUNTER — Other Ambulatory Visit: Payer: Self-pay | Admitting: Emergency Medicine

## 2021-06-07 DIAGNOSIS — N529 Male erectile dysfunction, unspecified: Secondary | ICD-10-CM

## 2021-08-17 ENCOUNTER — Other Ambulatory Visit: Payer: Self-pay

## 2021-08-17 ENCOUNTER — Encounter: Payer: Self-pay | Admitting: Emergency Medicine

## 2021-08-17 ENCOUNTER — Ambulatory Visit (INDEPENDENT_AMBULATORY_CARE_PROVIDER_SITE_OTHER): Payer: BLUE CROSS/BLUE SHIELD | Admitting: Emergency Medicine

## 2021-08-17 VITALS — BP 134/78 | HR 88 | Ht 69.0 in | Wt 191.0 lb

## 2021-08-17 DIAGNOSIS — E1169 Type 2 diabetes mellitus with other specified complication: Secondary | ICD-10-CM

## 2021-08-17 DIAGNOSIS — I10 Essential (primary) hypertension: Secondary | ICD-10-CM

## 2021-08-17 DIAGNOSIS — E1159 Type 2 diabetes mellitus with other circulatory complications: Secondary | ICD-10-CM | POA: Diagnosis not present

## 2021-08-17 DIAGNOSIS — E785 Hyperlipidemia, unspecified: Secondary | ICD-10-CM

## 2021-08-17 LAB — POCT GLYCOSYLATED HEMOGLOBIN (HGB A1C): Hemoglobin A1C: 6.9 % — AB (ref 4.0–5.6)

## 2021-08-17 NOTE — Patient Instructions (Signed)

## 2021-08-17 NOTE — Progress Notes (Signed)
Jacob Chan 65 y.o.   Chief Complaint  Patient presents with   Hypertension    6 month f/u   Diabetes    HISTORY OF PRESENT ILLNESS: This is a 65 y.o. male here for 19-month recheck on diabetes and hypertension. Compliant with diet and medications. Blood pressure readings at home within normal limits. BP Readings from Last 3 Encounters:  02/15/21 (!) 142/98  09/23/20 128/82  09/22/20 120/80   Lab Results  Component Value Date   HGBA1C 6.9 (A) 02/15/2021     Hypertension Pertinent negatives include no chest pain, headaches, palpitations or shortness of breath.  Diabetes Pertinent negatives for hypoglycemia include no dizziness or headaches. Pertinent negatives for diabetes include no chest pain.    Prior to Admission medications   Medication Sig Start Date End Date Taking? Authorizing Provider  acetaminophen (TYLENOL) 500 MG tablet Take 1,000 mg by mouth every 6 (six) hours as needed. PAIN   Yes [provider]  amLODipine (NORVASC) 5 MG tablet Take 1 tablet (5 mg total) by mouth every morning. 03/08/21 08/17/21 Yes Bonnie Roig, Ines Bloomer, MD  ascorbic acid (VITAMIN C) 500 MG tablet Take 500 mg by mouth daily.   Yes [provider]  aspirin EC 81 MG tablet Take 81 mg by mouth daily.   Yes [provider]  diclofenac sodium (VOLTAREN) 1 % GEL Apply 2 g topically 2 (two) times daily. 02/18/19  Yes Rip Hawes, Ines Bloomer, MD  DM-APAP-CPM (CORICIDIN HBP PO) Take by mouth as needed.   Yes [provider]  glipiZIDE (GLUCOTROL) 5 MG tablet Take 1 tablet (5 mg total) by mouth daily before breakfast. 05/10/21 08/17/21 Yes Shanikia Kernodle, Ines Bloomer, MD  lisinopril (ZESTRIL) 20 MG tablet Take 1 tablet (20 mg total) by mouth daily. 05/10/21 08/17/21 Yes Emaree Chiu, Ines Bloomer, MD  metFORMIN (GLUCOPHAGE-XR) 500 MG 24 hr tablet TAKE 1 TABLET ONCE DAILY WITH BREAKFAST. 02/15/21  Yes Madyx Delfin, Ines Bloomer, MD  Multiple Vitamins-Minerals (CENTRUM SILVER PO)  Take 1 tablet by mouth daily.   Yes [provider]  rosuvastatin (CRESTOR) 5 MG tablet Take 1 tablet (5 mg total) by mouth daily. 03/08/21 08/17/21 Yes Ellias Mcelreath, Ines Bloomer, MD  sildenafil (REVATIO) 20 MG tablet TAKE 2 TABLETS APPROXIMATELY 30 TO 60 MINUTES BEFORE INTERCOURSE. 06/07/21  Yes Taffie Eckmann, Ines Bloomer, MD  cetirizine (ZYRTEC ALLERGY) 10 MG tablet Take 1 tablet (10 mg total) by mouth daily. Patient not taking: Reported on 02/15/2021 09/14/20   Jaynee Eagles, PA-C    Not on File  Patient Active Problem List   Diagnosis Date Noted   Diverticulosis 02/15/2021   Chronic hoarseness 02/15/2021   Nonrheumatic aortic valve stenosis 02/15/2021   Hypertension associated with diabetes ([place]) 12/20/2016   Dyslipidemia associated with type 2 diabetes mellitus ([place]) 12/20/2016    Past Medical History:  Diagnosis Date   Chronic kidney disease    many kidney stones   Diabetes mellitus    Diabetes mellitus without complication ([place])    [employer] 09/09/2020   Hypertension     Past Surgical History:  Procedure Laterality Date   LITHOTRIPSY     3 yrs ago    Social History   Socioeconomic History   Marital status: Married    Spouse name: [name]   Number of children: 4   Years of education: Not on file   Highest education level: Not on file  Occupational History   Not on file  Tobacco Use   Smoking status: Never   Smokeless tobacco:  Never  Vaping Use   Vaping Use: Never used  Substance and Sexual Activity   Alcohol use: No   Drug use: No   Sexual activity: Yes  Other Topics Concern   Not on file  Social History Narrative   Not on file   Social Determinants of Health   Financial Resource Strain: Not on file  Food Insecurity: Not on file  Transportation Needs: Not on file  Physical Activity: Not on file  Stress: Not on file  Social Connections: Not on file  Intimate Partner Violence: Not on file    Family History  Problem Relation Age of Onset   Diabetes  Mother    Heart disease Mother    Diabetes Father    Colon cancer Neg Hx    Esophageal cancer Neg Hx    Stomach cancer Neg Hx    Rectal cancer Neg Hx      Review of Systems  Constitutional: Negative.  Negative for chills and fever.  HENT: Negative.  Negative for congestion and sore throat.   Respiratory: Negative.  Negative for cough and shortness of breath.   Cardiovascular: Negative.  Negative for chest pain and palpitations.  Gastrointestinal: Negative.  Negative for abdominal pain, diarrhea, nausea and vomiting.  Genitourinary:        Erectile dysfunction  Skin: Negative.  Negative for rash.  Neurological: Negative.  Negative for dizziness and headaches.  All other systems reviewed and are negative.  There were no vitals filed for this visit.  Physical Exam Vitals reviewed.  Constitutional:      Appearance: Normal appearance.  HENT:     Head: Normocephalic.  Eyes:     Extraocular Movements: Extraocular movements intact.     Pupils: Pupils are equal, round, and reactive to light.  Cardiovascular:     Rate and Rhythm: Normal rate.  Pulmonary:     Effort: Pulmonary effort is normal.  Musculoskeletal:     Cervical back: Normal range of motion.  Skin:    General: Skin is warm and dry.     Capillary Refill: Capillary refill takes less than 2 seconds.  Neurological:     General: No focal deficit present.     Mental Status: He is alert and oriented to person, place, and time.  Psychiatric:        Mood and Affect: Mood normal.        Behavior: Behavior normal.    Results for orders placed or performed in visit on 08/17/21 (from the past 24 hour(s))  POCT glycosylated hemoglobin (Hb A1C)     Status: Abnormal   Collection Time: 08/17/21  4:30 PM  Result Value Ref Range   Hemoglobin A1C 6.9 (A) 4.0 - 5.6 %   HbA1c POC (<> result, manual entry)     HbA1c, POC (prediabetic range)     HbA1c, POC (controlled diabetic range)      ASSESSMENT & PLAN: Problem List Items  Addressed This Visit       Cardiovascular and Mediastinum   Hypertension associated with diabetes ([place]) - Primary    Well-controlled hypertension.  Continue lisinopril 20 mg and amlodipine 5 mg daily. Well-controlled diabetes with hemoglobin A1c of 6.9 Continue metformin and glipizide Diet and nutrition discussed. Follow-up in 6 months.      Relevant Orders   POCT glycosylated hemoglobin (Hb A1C) (Completed)     Endocrine   Dyslipidemia associated with type 2 diabetes mellitus ([place])    Stable.  Diet and nutrition discussed. Continue  rosuvastatin 5 mg daily. The 10-year ASCVD risk score (Arnett DK, et al., 2019) is: 28%   Values used to calculate the score:     Age: 56 years     Sex: Male     Is Non-Hispanic African American: Yes     Diabetic: Yes     Tobacco smoker: No     Systolic Blood Pressure: 099 mmHg     Is BP treated: Yes     HDL Cholesterol: 58.6 mg/dL     Total Cholesterol: 147 mg/dL       Patient Instructions  Hypertension, Adult High blood pressure (hypertension) is when the force of blood pumping through the arteries is too strong. The arteries are the blood vessels that carry blood from the heart throughout the body. Hypertension forces the heart to work harder to pump blood and may cause arteries to become narrow or stiff. Untreated or uncontrolled hypertension can cause a heart attack, heart failure, a stroke, kidney disease, and other problems. A blood pressure reading consists of a higher number over a lower number. Ideally, your blood pressure should be below 120/80. The first ("top") number is called the systolic pressure. It is a measure of the pressure in your arteries as your heart beats. The second ("bottom") number is called the diastolic pressure. It is a measure of the pressure in your arteries as the heart relaxes. What are the causes? The exact cause of this condition is not known. There are some conditions that result in or are related to high blood  pressure. What increases the risk? Some risk factors for high blood pressure are under your control. The following factors may make you more likely to develop this condition: Smoking. Having type 2 diabetes mellitus, high cholesterol, or both. Not getting enough exercise or physical activity. Being overweight. Having too much fat, sugar, calories, or salt (sodium) in your diet. Drinking too much alcohol. Some risk factors for high blood pressure may be difficult or impossible to change. Some of these factors include: Having chronic kidney disease. Having a family history of high blood pressure. Age. Risk increases with age. Race. You may be at higher risk if you are African American. Gender. Men are at higher risk than women before age 7. After age 93, women are at higher risk than men. Having obstructive sleep apnea. Stress. What are the signs or symptoms? High blood pressure may not cause symptoms. Very high blood pressure (hypertensive crisis) may cause: Headache. Anxiety. Shortness of breath. Nosebleed. Nausea and vomiting. Vision changes. Severe chest pain. Seizures. How is this diagnosed? This condition is diagnosed by measuring your blood pressure while you are seated, with your arm resting on a flat surface, your legs uncrossed, and your feet flat on the floor. The cuff of the blood pressure monitor will be placed directly against the skin of your upper arm at the level of your heart. It should be measured at least twice using the same arm. Certain conditions can cause a difference in blood pressure between your right and left arms. Certain factors can cause blood pressure readings to be lower or higher than normal for a short period of time: When your blood pressure is higher when you are in a health care provider's office than when you are at home, this is called white coat hypertension. Most people with this condition do not need medicines. When your blood pressure is  higher at home than when you are in a health care provider's office, this  is called masked hypertension. Most people with this condition may need medicines to control blood pressure. If you have a high blood pressure reading during one visit or you have normal blood pressure with other risk factors, you may be asked to: Return on a different day to have your blood pressure checked again. Monitor your blood pressure at home for 1 week or longer. If you are diagnosed with hypertension, you may have other blood or imaging tests to help your health care provider understand your overall risk for other conditions. How is this treated? This condition is treated by making healthy lifestyle changes, such as eating healthy foods, exercising more, and reducing your alcohol intake. Your health care provider may prescribe medicine if lifestyle changes are not enough to get your blood pressure under control, and if: Your systolic blood pressure is above 130. Your diastolic blood pressure is above 80. Your personal target blood pressure may vary depending on your medical conditions, your age, and other factors. Follow these instructions at home: Eating and drinking  Eat a diet that is high in fiber and potassium, and low in sodium, added sugar, and fat. An example eating plan is called the DASH (Dietary Approaches to Stop Hypertension) diet. To eat this way: Eat plenty of fresh fruits and vegetables. Try to fill one half of your plate at each meal with fruits and vegetables. Eat whole grains, such as whole-wheat pasta, brown rice, or whole-grain bread. Fill about one fourth of your plate with whole grains. Eat or drink low-fat dairy products, such as skim milk or low-fat yogurt. Avoid fatty cuts of meat, processed or cured meats, and poultry with skin. Fill about one fourth of your plate with lean proteins, such as fish, chicken without skin, beans, eggs, or tofu. Avoid pre-made and processed foods. These tend to  be higher in sodium, added sugar, and fat. Reduce your daily sodium intake. Most people with hypertension should eat less than 1,500 mg of sodium a day. Do not drink alcohol if: Your health care provider tells you not to drink. You are pregnant, may be pregnant, or are planning to become pregnant. If you drink alcohol: Limit how much you use to: 0-1 drink a day for women. 0-2 drinks a day for men. Be aware of how much alcohol is in your drink. In the [place] one drink equals one 12 oz bottle of beer (355 mL), one 5 oz glass of wine (148 mL), or one 1 oz glass of hard liquor (44 mL). Lifestyle  Work with your health care provider to maintain a healthy body weight or to lose weight. Ask what an ideal weight is for you. Get at least 30 minutes of exercise most days of the week. Activities may include walking, swimming, or biking. Include exercise to strengthen your muscles (resistance exercise), such as Pilates or lifting weights, as part of your weekly exercise routine. Try to do these types of exercises for 30 minutes at least 3 days a week. Do not use any products that contain nicotine or tobacco, such as cigarettes, e-cigarettes, and chewing tobacco. If you need help quitting, ask your health care provider. Monitor your blood pressure at home as told by your health care provider. Keep all follow-up visits as told by your health care provider. This is important. Medicines Take over-the-counter and prescription medicines only as told by your health care provider. Follow directions carefully. Blood pressure medicines must be taken as prescribed. Do not skip doses of blood pressure medicine.  Doing this puts you at risk for problems and can make the medicine less effective. Ask your health care provider about side effects or reactions to medicines that you should watch for. Contact a health care provider if you: Think you are having a reaction to a medicine you are taking. Have headaches that keep  coming back (recurring). Feel dizzy. Have swelling in your ankles. Have trouble with your vision. Get help right away if you: Develop a severe headache or confusion. Have unusual weakness or numbness. Feel faint. Have severe pain in your chest or abdomen. Vomit repeatedly. Have trouble breathing. Summary Hypertension is when the force of blood pumping through your arteries is too strong. If this condition is not controlled, it may put you at risk for serious complications. Your personal target blood pressure may vary depending on your medical conditions, your age, and other factors. For most people, a normal blood pressure is less than 120/80. Hypertension is treated with lifestyle changes, medicines, or a combination of both. Lifestyle changes include losing weight, eating a healthy, low-sodium diet, exercising more, and limiting alcohol. This information is not intended to replace advice given to you by your health care provider. Make sure you discuss any questions you have with your health care provider. Document Revised: 04/25/2018 Document Reviewed: 04/25/2018 Elsevier Patient Education  2022 [employer], MD [facility]

## 2021-08-17 NOTE — Assessment & Plan Note (Signed)
Well-controlled hypertension.  Continue lisinopril 20 mg and amlodipine 5 mg daily. Well-controlled diabetes with hemoglobin A1c of 6.9 Continue metformin and glipizide Diet and nutrition discussed. Follow-up in 6 months.

## 2021-08-17 NOTE — Assessment & Plan Note (Signed)
Stable.  Diet and nutrition discussed. Continue rosuvastatin 5 mg daily. The 10-year ASCVD risk score (Arnett DK, et al., 2019) is: 28%   Values used to calculate the score:     Age: 65 years     Sex: Male     Is Non-Hispanic African American: Yes     Diabetic: Yes     Tobacco smoker: No     Systolic Blood Pressure: 591 mmHg     Is BP treated: Yes     HDL Cholesterol: 58.6 mg/dL     Total Cholesterol: 147 mg/dL

## 2021-10-13 ENCOUNTER — Other Ambulatory Visit: Payer: Self-pay | Admitting: Emergency Medicine

## 2021-10-13 DIAGNOSIS — N529 Male erectile dysfunction, unspecified: Secondary | ICD-10-CM

## 2021-11-11 ENCOUNTER — Other Ambulatory Visit: Payer: Self-pay | Admitting: Emergency Medicine

## 2021-11-11 DIAGNOSIS — I152 Hypertension secondary to endocrine disorders: Secondary | ICD-10-CM

## 2021-11-11 DIAGNOSIS — N529 Male erectile dysfunction, unspecified: Secondary | ICD-10-CM

## 2021-12-13 ENCOUNTER — Other Ambulatory Visit: Payer: Self-pay | Admitting: Emergency Medicine

## 2021-12-13 DIAGNOSIS — N529 Male erectile dysfunction, unspecified: Secondary | ICD-10-CM

## 2022-01-12 ENCOUNTER — Other Ambulatory Visit: Payer: Self-pay | Admitting: Emergency Medicine

## 2022-01-12 DIAGNOSIS — N529 Male erectile dysfunction, unspecified: Secondary | ICD-10-CM

## 2022-02-11 ENCOUNTER — Other Ambulatory Visit: Payer: Self-pay | Admitting: Emergency Medicine

## 2022-02-11 DIAGNOSIS — N529 Male erectile dysfunction, unspecified: Secondary | ICD-10-CM

## 2022-02-15 ENCOUNTER — Encounter: Payer: Self-pay | Admitting: Emergency Medicine

## 2022-02-15 ENCOUNTER — Ambulatory Visit: Payer: Federal, State, Local not specified - PPO | Admitting: Emergency Medicine

## 2022-02-15 VITALS — BP 132/84 | HR 91 | Temp 98.5°F | Ht 69.0 in | Wt 182.0 lb

## 2022-02-15 DIAGNOSIS — I10 Essential (primary) hypertension: Secondary | ICD-10-CM | POA: Diagnosis not present

## 2022-02-15 DIAGNOSIS — I350 Nonrheumatic aortic (valve) stenosis: Secondary | ICD-10-CM

## 2022-02-15 DIAGNOSIS — I152 Hypertension secondary to endocrine disorders: Secondary | ICD-10-CM

## 2022-02-15 DIAGNOSIS — E1169 Type 2 diabetes mellitus with other specified complication: Secondary | ICD-10-CM | POA: Diagnosis not present

## 2022-02-15 DIAGNOSIS — E1159 Type 2 diabetes mellitus with other circulatory complications: Secondary | ICD-10-CM | POA: Diagnosis not present

## 2022-02-15 DIAGNOSIS — E785 Hyperlipidemia, unspecified: Secondary | ICD-10-CM

## 2022-02-15 LAB — POCT GLYCOSYLATED HEMOGLOBIN (HGB A1C): Hemoglobin A1C: 6.7 % — AB (ref 4.0–5.6)

## 2022-02-15 MED ORDER — AMLODIPINE BESYLATE 5 MG PO TABS: 5.0000 mg | ORAL_TABLET | Freq: Every morning | ORAL | 3 refills | Status: DC

## 2022-02-15 MED ORDER — TADALAFIL 20 MG PO TABS: 10.0000 mg | ORAL_TABLET | ORAL | 11 refills | Status: DC | PRN

## 2022-02-15 MED ORDER — ROSUVASTATIN CALCIUM 5 MG PO TABS: 5.0000 mg | ORAL_TABLET | Freq: Every day | ORAL | 3 refills | Status: DC

## 2022-02-15 MED ORDER — LISINOPRIL 20 MG PO TABS: 20.0000 mg | ORAL_TABLET | Freq: Every day | ORAL | 3 refills | Status: DC

## 2022-02-15 NOTE — Assessment & Plan Note (Signed)
Stable.  Diet and nutrition discussed.  Continue rosuvastatin 5 mg daily. Lipid profile done today The 10-year ASCVD risk score (Arnett DK, et al., 2019) is: 28.2%   Values used to calculate the score:     Age: 66 years     Sex: Male     Is Non-Hispanic African American: Yes     Diabetic: Yes     Tobacco smoker: No     Systolic Blood Pressure: 483 mmHg     Is BP treated: Yes     HDL Cholesterol: 58.6 mg/dL     Total Cholesterol: 147 mg/dL

## 2022-02-15 NOTE — Assessment & Plan Note (Signed)
Well-controlled hypertension.  Continue lisinopril 20 mg and amlodipine 5 mg daily. BP Readings from Last 3 Encounters:  02/15/22 132/84  08/17/21 134/78  02/15/21 (!) 142/98  Well-controlled diabetes with hemoglobin A1c of 6.7. Continue metformin 500 mg and glipizide 5 mg daily. Diet and nutrition discussed. Cardiovascular risks associated with hypertension and diabetes discussed. Follow-up in 6 months.

## 2022-02-15 NOTE — Progress Notes (Signed)
Jacob Chan 66 y.o.   Chief Complaint  Patient presents with   Follow-up    Having pain and cramps in the leg.     HISTORY OF PRESENT ILLNESS: This is a 67 y.o. male with history of hypertension and diabetes here for follow-up. Also complaining of occasional cramps to both legs mostly at night. Overall doing well.  No other complaints or medical concerns today Compliant with diet and medications. BP Readings from Last 3 Encounters:  02/15/22 132/84  08/17/21 134/78  02/15/21 (!) 142/98     HPI   Prior to Admission medications   Medication Sig Start Date End Date Taking? Authorizing Provider  acetaminophen (TYLENOL) 500 MG tablet Take 1,000 mg by mouth every 6 (six) hours as needed. PAIN   Yes [provider]  ascorbic acid (VITAMIN C) 500 MG tablet Take 500 mg by mouth daily.   Yes [provider]  aspirin EC 81 MG tablet Take 81 mg by mouth daily.   Yes [provider]  diclofenac sodium (VOLTAREN) 1 % GEL Apply 2 g topically 2 (two) times daily. 02/18/19  Yes Taryn Nave, Ines Bloomer, MD  DM-APAP-CPM (CORICIDIN HBP PO) Take by mouth as needed.   Yes [provider]  glipiZIDE (GLUCOTROL) 5 MG tablet Take 1 tablet (5 mg total) by mouth daily before breakfast. 12/14/21 12/09/22 Yes Jermiya Reichl, Ines Bloomer, MD  metFORMIN (GLUCOPHAGE-XR) 500 MG 24 hr tablet TAKE 1 TABLET ONCE DAILY WITH BREAKFAST. 11/12/21  Yes Darilyn Storbeck, Ines Bloomer, MD  Multiple Vitamins-Minerals (CENTRUM SILVER PO) Take 1 tablet by mouth daily.   Yes [provider]  sildenafil (REVATIO) 20 MG tablet TAKE 2 TABLETS APPROXIMATELY 30 TO 60 MINUTES BEFORE INTERCOURSE. 02/11/22  Yes Kourosh Jablonsky, Ines Bloomer, MD  amLODipine (NORVASC) 5 MG tablet Take 1 tablet (5 mg total) by mouth every morning. 03/08/21 08/17/21  Horald Pollen, MD  cetirizine (ZYRTEC ALLERGY) 10 MG tablet Take 1 tablet (10 mg total) by mouth daily. Patient not taking: Reported on 02/15/2021 09/14/20    Jaynee Eagles, PA-C  lisinopril (ZESTRIL) 20 MG tablet Take 1 tablet (20 mg total) by mouth daily. 05/10/21 08/17/21  Horald Pollen, MD  rosuvastatin (CRESTOR) 5 MG tablet Take 1 tablet (5 mg total) by mouth daily. 03/08/21 08/17/21  Horald Pollen, MD    No Known Allergies  Patient Active Problem List   Diagnosis Date Noted   Diverticulosis 02/15/2021   Chronic hoarseness 02/15/2021   Nonrheumatic aortic valve stenosis 02/15/2021   Hypertension associated with diabetes ([place]) 12/20/2016   Dyslipidemia associated with type 2 diabetes mellitus ([place]) 12/20/2016    Past Medical History:  Diagnosis Date   Chronic kidney disease    many kidney stones   Diabetes mellitus    Diabetes mellitus without complication ([place])    Phreesia 09/09/2020   Hypertension     Past Surgical History:  Procedure Laterality Date   LITHOTRIPSY     3 yrs ago    Social History   Socioeconomic History   Marital status: Married    Spouse name: Jacob Chan   Number of children: 4   Years of education: Not on file   Highest education level: Not on file  Occupational History   Not on file  Tobacco Use   Smoking status: Never   Smokeless tobacco: Never  Vaping Use   Vaping Use: Never used  Substance and Sexual Activity   Alcohol use: No   Drug use: No   Sexual activity:  Yes  Other Topics Concern   Not on file  Social History Narrative   Not on file   Social Determinants of Health   Financial Resource Strain: Not on file  Food Insecurity: Not on file  Transportation Needs: Not on file  Physical Activity: Not on file  Stress: Not on file  Social Connections: Not on file  Intimate Partner Violence: Not on file    Family History  Problem Relation Age of Onset   Diabetes Mother    Heart disease Mother    Diabetes Father    Colon cancer Neg Hx    Esophageal cancer Neg Hx    Stomach cancer Neg Hx    Rectal cancer Neg Hx      Review of Systems  Constitutional: Negative.   Negative for chills and fever.  HENT: Negative.  Negative for congestion and sore throat.   Respiratory: Negative.  Negative for cough and shortness of breath.   Cardiovascular: Negative.  Negative for chest pain and palpitations.  Gastrointestinal: Negative.  Negative for abdominal pain, diarrhea, nausea and vomiting.  Genitourinary: Negative.  Negative for dysuria.  Skin: Negative.  Negative for rash.  Neurological: Negative.  Negative for dizziness and headaches.  All other systems reviewed and are negative.  Today's Vitals   02/15/22 1613  BP: 132/84  Pulse: 91  Temp: 98.5 F (36.9 C)  TempSrc: Oral  SpO2: 97%  Weight: 182 lb (82.6 kg)  Height: '5\' 9"'$  (1.753 m)   Body mass index is 26.88 kg/m.   Physical Exam Vitals reviewed.  Constitutional:      Appearance: Normal appearance.  HENT:     Head: Normocephalic.  Eyes:     Extraocular Movements: Extraocular movements intact.     Conjunctiva/sclera: Conjunctivae normal.     Pupils: Pupils are equal, round, and reactive to light.  Cardiovascular:     Rate and Rhythm: Normal rate and regular rhythm.     Pulses: Normal pulses.     Heart sounds: Murmur (Systolic 3/6 aortic area) heard.  Pulmonary:     Effort: Pulmonary effort is normal.     Breath sounds: Normal breath sounds.  Abdominal:     Palpations: Abdomen is soft.     Tenderness: There is no abdominal tenderness.  Musculoskeletal:     Cervical back: No tenderness.     Right lower leg: No edema.     Left lower leg: No edema.  Lymphadenopathy:     Cervical: No cervical adenopathy.  Skin:    General: Skin is warm and dry.     Capillary Refill: Capillary refill takes less than 2 seconds.  Neurological:     General: No focal deficit present.     Mental Status: He is alert and oriented to person, place, and time.  Psychiatric:        Mood and Affect: Mood normal.        Behavior: Behavior normal.    Results for orders placed or performed in visit on 02/15/22  (from the past 24 hour(s))  POCT HgB A1C     Status: Abnormal   Collection Time: 02/15/22  4:27 PM  Result Value Ref Range   Hemoglobin A1C 6.7 (A) 4.0 - 5.6 %   HbA1c POC (<> result, manual entry)     HbA1c, POC (prediabetic range)     HbA1c, POC (controlled diabetic range)       ASSESSMENT & PLAN: A total of 51 minutes was spent with the patient and  counseling/coordination of care regarding preparing for this visit, review of most recent office visit notes, review of multiple chronic medical problems and their management, review of all medications, review of most recent echocardiogram report, cardiovascular risks associated with diabetes and hypertension, education on nutrition, prognosis, documentation and need for follow-up.  Problem List Items Addressed This Visit       Cardiovascular and Mediastinum   Hypertension associated with diabetes ([place]) - Primary    Well-controlled hypertension.  Continue lisinopril 20 mg and amlodipine 5 mg daily. BP Readings from Last 3 Encounters:  02/15/22 132/84  08/17/21 134/78  02/15/21 (!) 142/98  Well-controlled diabetes with hemoglobin A1c of 6.7. Continue metformin 500 mg and glipizide 5 mg daily. Diet and nutrition discussed. Cardiovascular risks associated with hypertension and diabetes discussed. Follow-up in 6 months.       Relevant Medications   amLODipine (NORVASC) 5 MG tablet   lisinopril (ZESTRIL) 20 MG tablet   rosuvastatin (CRESTOR) 5 MG tablet   tadalafil (CIALIS) 20 MG tablet   Other Relevant Orders   Urine Microalbumin w/creat. ratio   Comprehensive metabolic panel   Lipid panel   Nonrheumatic aortic valve stenosis    Stable.  Asymptomatic.      Relevant Medications   amLODipine (NORVASC) 5 MG tablet   lisinopril (ZESTRIL) 20 MG tablet   rosuvastatin (CRESTOR) 5 MG tablet   tadalafil (CIALIS) 20 MG tablet     Endocrine   Dyslipidemia associated with type 2 diabetes mellitus ([facility])    Stable.  Diet and nutrition  discussed.  Continue rosuvastatin 5 mg daily. Lipid profile done today The 10-year ASCVD risk score (Arnett DK, et al., 2019) is: 28.2%   Values used to calculate the score:     Age: 60 years     Sex: Male     Is Non-Hispanic African American: Yes     Diabetic: Yes     Tobacco smoker: No     Systolic Blood Pressure: 937 mmHg     Is BP treated: Yes     HDL Cholesterol: 58.6 mg/dL     Total Cholesterol: 147 mg/dL       Relevant Medications   lisinopril (ZESTRIL) 20 MG tablet   rosuvastatin (CRESTOR) 5 MG tablet   Other Relevant Orders   POCT HgB A1C (Completed)   Other Visit Diagnoses     Essential hypertension       Relevant Medications   amLODipine (NORVASC) 5 MG tablet   lisinopril (ZESTRIL) 20 MG tablet   rosuvastatin (CRESTOR) 5 MG tablet   tadalafil (CIALIS) 20 MG tablet   Hyperlipidemia, unspecified hyperlipidemia type       Relevant Medications   amLODipine (NORVASC) 5 MG tablet   lisinopril (ZESTRIL) 20 MG tablet   rosuvastatin (CRESTOR) 5 MG tablet   tadalafil (CIALIS) 20 MG tablet      Patient Instructions  Health Maintenance After Age 54 After age 69, you are at a higher risk for certain long-term diseases and infections as well as injuries from falls. Falls are a major cause of broken bones and head injuries in people who are older than age 13. Getting regular preventive care can help to keep you healthy and well. Preventive care includes getting regular testing and making lifestyle changes as recommended by your health care provider. Talk with your health care provider about: Which screenings and tests you should have. A screening is a test that checks for a disease when you have no symptoms. A diet  and exercise plan that is right for you. What should I know about screenings and tests to prevent falls? Screening and testing are the best ways to find a health problem early. Early diagnosis and treatment give you the best chance of managing medical conditions  that are common after age 6. Certain conditions and lifestyle choices may make you more likely to have a fall. Your health care provider may recommend: Regular vision checks. Poor vision and conditions such as cataracts can make you more likely to have a fall. If you wear glasses, make sure to get your prescription updated if your vision changes. Medicine review. Work with your health care provider to regularly review all of the medicines you are taking, including over-the-counter medicines. Ask your health care provider about any side effects that may make you more likely to have a fall. Tell your health care provider if any medicines that you take make you feel dizzy or sleepy. Strength and balance checks. Your health care provider may recommend certain tests to check your strength and balance while standing, walking, or changing positions. Foot health exam. Foot pain and numbness, as well as not wearing proper footwear, can make you more likely to have a fall. Screenings, including: Osteoporosis screening. Osteoporosis is a condition that causes the bones to get weaker and break more easily. Blood pressure screening. Blood pressure changes and medicines to control blood pressure can make you feel dizzy. Depression screening. You may be more likely to have a fall if you have a fear of falling, feel depressed, or feel unable to do activities that you used to do. Alcohol use screening. Using too much alcohol can affect your balance and may make you more likely to have a fall. Follow these instructions at home: Lifestyle Do not drink alcohol if: Your health care provider tells you not to drink. If you drink alcohol: Limit how much you have to: 0-1 drink a day for women. 0-2 drinks a day for men. Know how much alcohol is in your drink. In the [place] one drink equals one 12 oz bottle of beer (355 mL), one 5 oz glass of wine (148 mL), or one 1 oz glass of hard liquor (44 mL). Do not use any products  that contain nicotine or tobacco. These products include cigarettes, chewing tobacco, and vaping devices, such as e-cigarettes. If you need help quitting, ask your health care provider. Activity  Follow a regular exercise program to stay fit. This will help you maintain your balance. Ask your health care provider what types of exercise are appropriate for you. If you need a cane or walker, use it as recommended by your health care provider. Wear supportive shoes that have nonskid soles. Safety  Remove any tripping hazards, such as rugs, cords, and clutter. Install safety equipment such as grab bars in bathrooms and safety rails on stairs. Keep rooms and walkways well-lit. General instructions Talk with your health care provider about your risks for falling. Tell your health care provider if: You fall. Be sure to tell your health care provider about all falls, even ones that seem minor. You feel dizzy, tiredness (fatigue), or off-balance. Take over-the-counter and prescription medicines only as told by your health care provider. These include supplements. Eat a healthy diet and maintain a healthy weight. A healthy diet includes low-fat dairy products, low-fat (lean) meats, and fiber from whole grains, beans, and lots of fruits and vegetables. Stay current with your vaccines. Schedule regular health, dental, and eye exams.  Summary Having a healthy lifestyle and getting preventive care can help to protect your health and wellness after age 22. Screening and testing are the best way to find a health problem early and help you avoid having a fall. Early diagnosis and treatment give you the best chance for managing medical conditions that are more common for people who are older than age 6. Falls are a major cause of broken bones and head injuries in people who are older than age 13. Take precautions to prevent a fall at home. Work with your health care provider to learn what changes you can make  to improve your health and wellness and to prevent falls. This information is not intended to replace advice given to you by your health care provider. Make sure you discuss any questions you have with your health care provider. Document Revised: 01/04/2021 Document Reviewed: 01/04/2021 Elsevier Patient Education  [place], MD [facility]

## 2022-02-15 NOTE — Patient Instructions (Signed)
Health Maintenance After Age 67 After age 67, you are at a higher risk for certain long-term diseases and infections as well as injuries from falls. Falls are a major cause of broken bones and head injuries in people who are older than age 67. Getting regular preventive care can help to keep you healthy and well. Preventive care includes getting regular testing and making lifestyle changes as recommended by your health care provider. Talk with your health care provider about: Which screenings and tests you should have. A screening is a test that checks for a disease when you have no symptoms. A diet and exercise plan that is right for you. What should I know about screenings and tests to prevent falls? Screening and testing are the best ways to find a health problem early. Early diagnosis and treatment give you the best chance of managing medical conditions that are common after age 67. Certain conditions and lifestyle choices may make you more likely to have a fall. Your health care provider may recommend: Regular vision checks. Poor vision and conditions such as cataracts can make you more likely to have a fall. If you wear glasses, make sure to get your prescription updated if your vision changes. Medicine review. Work with your health care provider to regularly review all of the medicines you are taking, including over-the-counter medicines. Ask your health care provider about any side effects that may make you more likely to have a fall. Tell your health care provider if any medicines that you take make you feel dizzy or sleepy. Strength and balance checks. Your health care provider may recommend certain tests to check your strength and balance while standing, walking, or changing positions. Foot health exam. Foot pain and numbness, as well as not wearing proper footwear, can make you more likely to have a fall. Screenings, including: Osteoporosis screening. Osteoporosis is a condition that causes  the bones to get weaker and break more easily. Blood pressure screening. Blood pressure changes and medicines to control blood pressure can make you feel dizzy. Depression screening. You may be more likely to have a fall if you have a fear of falling, feel depressed, or feel unable to do activities that you used to do. Alcohol use screening. Using too much alcohol can affect your balance and may make you more likely to have a fall. Follow these instructions at home: Lifestyle Do not drink alcohol if: Your health care provider tells you not to drink. If you drink alcohol: Limit how much you have to: 0-1 drink a day for women. 0-2 drinks a day for men. Know how much alcohol is in your drink. In the [place] one drink equals one 12 oz bottle of beer (355 mL), one 5 oz glass of wine (148 mL), or one 1 oz glass of hard liquor (44 mL). Do not use any products that contain nicotine or tobacco. These products include cigarettes, chewing tobacco, and vaping devices, such as e-cigarettes. If you need help quitting, ask your health care provider. Activity  Follow a regular exercise program to stay fit. This will help you maintain your balance. Ask your health care provider what types of exercise are appropriate for you. If you need a cane or walker, use it as recommended by your health care provider. Wear supportive shoes that have nonskid soles. Safety  Remove any tripping hazards, such as rugs, cords, and clutter. Install safety equipment such as grab bars in bathrooms and safety rails on stairs. Keep rooms and walkways   well-lit. General instructions Talk with your health care provider about your risks for falling. Tell your health care provider if: You fall. Be sure to tell your health care provider about all falls, even ones that seem minor. You feel dizzy, tiredness (fatigue), or off-balance. Take over-the-counter and prescription medicines only as told by your health care provider. These include  supplements. Eat a healthy diet and maintain a healthy weight. A healthy diet includes low-fat dairy products, low-fat (lean) meats, and fiber from whole grains, beans, and lots of fruits and vegetables. Stay current with your vaccines. Schedule regular health, dental, and eye exams. Summary Having a healthy lifestyle and getting preventive care can help to protect your health and wellness after age 67. Screening and testing are the best way to find a health problem early and help you avoid having a fall. Early diagnosis and treatment give you the best chance for managing medical conditions that are more common for people who are older than age 67. Falls are a major cause of broken bones and head injuries in people who are older than age 67. Take precautions to prevent a fall at home. Work with your health care provider to learn what changes you can make to improve your health and wellness and to prevent falls. This information is not intended to replace advice given to you by your health care provider. Make sure you discuss any questions you have with your health care provider. Document Revised: 01/04/2021 Document Reviewed: 01/04/2021 Elsevier Patient Education  2023 Elsevier Inc.  

## 2022-02-15 NOTE — Assessment & Plan Note (Signed)
Stable. Asymptomatic.

## 2022-03-14 ENCOUNTER — Other Ambulatory Visit: Payer: Self-pay | Admitting: Emergency Medicine

## 2022-03-14 DIAGNOSIS — E785 Hyperlipidemia, unspecified: Secondary | ICD-10-CM

## 2022-03-14 DIAGNOSIS — I152 Hypertension secondary to endocrine disorders: Secondary | ICD-10-CM

## 2022-03-14 DIAGNOSIS — I10 Essential (primary) hypertension: Secondary | ICD-10-CM

## 2022-04-14 ENCOUNTER — Other Ambulatory Visit: Payer: Self-pay | Admitting: Emergency Medicine

## 2022-04-14 DIAGNOSIS — E1159 Type 2 diabetes mellitus with other circulatory complications: Secondary | ICD-10-CM

## 2022-07-18 ENCOUNTER — Other Ambulatory Visit: Payer: Self-pay | Admitting: *Deleted

## 2022-07-18 ENCOUNTER — Other Ambulatory Visit: Payer: Self-pay | Admitting: Emergency Medicine

## 2022-07-18 ENCOUNTER — Telehealth: Payer: Self-pay | Admitting: Emergency Medicine

## 2022-07-18 MED ORDER — NIRMATRELVIR/RITONAVIR (PAXLOVID)TABLET: 3.0000 | ORAL_TABLET | Freq: Two times a day (BID) | ORAL | 0 refills | Status: DC

## 2022-07-18 MED ORDER — NIRMATRELVIR/RITONAVIR (PAXLOVID)TABLET: 3.0000 | ORAL_TABLET | Freq: Two times a day (BID) | ORAL | 0 refills | Status: AC

## 2022-07-18 NOTE — Telephone Encounter (Signed)
Caller: ( SELF),  Relationship: ( SELF)  Call Back Number: [phone]  Date of Last Office Visit: 02/15/22  Date of Next Office Visit: 08/17/22  Medication(s) to be Refilled: RX FOR COVID  REQUEST RX FOR COVID  Tested POSITIVE 07/16/22 (home test) SYMPTOMS: Runny nose. No other symptoms  Preferred Pharmacy: [facility], [place] [address] [place], [place] [address] Phone: [phone]  Fax: [phone]

## 2022-07-18 NOTE — Telephone Encounter (Signed)
Prescription for Paxlovid sent to pharmacy requested.  Thanks.

## 2022-08-17 ENCOUNTER — Encounter: Payer: Self-pay | Admitting: Emergency Medicine

## 2022-08-17 ENCOUNTER — Ambulatory Visit: Payer: Federal, State, Local not specified - PPO | Admitting: Emergency Medicine

## 2022-08-17 VITALS — BP 136/86 | HR 85 | Temp 98.4°F | Ht 69.0 in | Wt 183.2 lb

## 2022-08-17 DIAGNOSIS — I152 Hypertension secondary to endocrine disorders: Secondary | ICD-10-CM

## 2022-08-17 DIAGNOSIS — E1169 Type 2 diabetes mellitus with other specified complication: Secondary | ICD-10-CM | POA: Diagnosis not present

## 2022-08-17 DIAGNOSIS — E1159 Type 2 diabetes mellitus with other circulatory complications: Secondary | ICD-10-CM

## 2022-08-17 DIAGNOSIS — Z23 Encounter for immunization: Secondary | ICD-10-CM

## 2022-08-17 DIAGNOSIS — E785 Hyperlipidemia, unspecified: Secondary | ICD-10-CM

## 2022-08-17 LAB — POCT GLYCOSYLATED HEMOGLOBIN (HGB A1C): Hemoglobin A1C: 6.5 % — AB (ref 4.0–5.6)

## 2022-08-17 NOTE — Patient Instructions (Signed)

## 2022-08-17 NOTE — Assessment & Plan Note (Signed)
Stable.  Diet and nutrition discussed. Continue rosuvastatin 5 mg daily. The 10-year ASCVD risk score (Arnett DK, et al., 2019) is: 29.6%   Values used to calculate the score:     Age: 66 years     Sex: Male     Is Non-Hispanic African American: Yes     Diabetic: Yes     Tobacco smoker: No     Systolic Blood Pressure: 527 mmHg     Is BP treated: Yes     HDL Cholesterol: 58.6 mg/dL     Total Cholesterol: 147 mg/dL

## 2022-08-17 NOTE — Progress Notes (Signed)
Jacob Chan 66 y.o.   Chief Complaint  Patient presents with   Follow-up    36mth f/u appt, patient experiencing nasal congestion    HISTORY OF PRESENT ILLNESS: This is a 66y.o. male here for 633-monthollow-up of hypertension and diabetes Overall doing well.  Has no complaints or medical concerns today Taking Cialis.  Has occasional nasal congestion.  HPI   Prior to Admission medications   Medication Sig Start Date End Date Taking? Authorizing Provider  acetaminophen (TYLENOL) 500 MG tablet Take 1,000 mg by mouth every 6 (six) hours as needed. PAIN    [provider]  amLODipine (NORVASC) 5 MG tablet Take 1 tablet (5 mg total) by mouth every morning. 02/15/22 02/10/23  SaHorald PollenMD  ascorbic acid (VITAMIN C) 500 MG tablet Take 500 mg by mouth daily.    [provider]  aspirin EC 81 MG tablet Take 81 mg by mouth daily.    [provider]  diclofenac sodium (VOLTAREN) 1 % GEL Apply 2 g topically 2 (two) times daily. 02/18/19   SaHorald PollenMD  DM-APAP-CPM (CORICIDIN HBP PO) Take by mouth as needed.    [provider]  glipiZIDE (GLUCOTROL) 5 MG tablet Take 1 tablet (5 mg total) by mouth daily before breakfast. 12/14/21 12/09/22  SaHorald PollenMD  lisinopril (ZESTRIL) 20 MG tablet Take 1 tablet (20 mg total) by mouth daily. 02/15/22 02/10/23  SaHorald PollenMD  metFORMIN (GLUCOPHAGE-XR) 500 MG 24 hr tablet Take 1 tablet (500 mg total) by mouth daily with breakfast. TAKE 1 TABLET ONCE DAILY WITH BREAKFAST. 04/14/22 04/09/23  SaHorald PollenMD  Multiple Vitamins-Minerals (CENTRUM SILVER PO) Take 1 tablet by mouth daily.    [provider]  rosuvastatin (CRESTOR) 5 MG tablet Take 1 tablet (5 mg total) by mouth daily. 02/15/22 02/10/23  SaHorald PollenMD  tadalafil (CIALIS) 20 MG tablet Take 0.5-1 tablets (10-20 mg total) by mouth every other day as needed for erectile dysfunction. 02/15/22    SaHorald PollenMD    No Known Allergies  Patient Active Problem List   Diagnosis Date Noted   Diverticulosis 02/15/2021   Chronic hoarseness 02/15/2021   Nonrheumatic aortic valve stenosis 02/15/2021   Hypertension associated with diabetes ([facility]   Dyslipidemia associated with type 2 diabetes mellitus ([facility]/[place]    Past Medical History:  Diagnosis Date   Chronic kidney disease    many kidney stones   Diabetes mellitus    Diabetes mellitus without complication ([facility]   [facility] 09/09/2020   Hypertension     Past Surgical History:  Procedure Laterality Date   LITHOTRIPSY     3 yrs ago    Social History   Socioeconomic History   Marital status: Married    Spouse name: [name]   Number of children: 4   Years of education: Not on file   Highest education level: Not on file  Occupational History   Not on file  Tobacco Use   Smoking status: Never   Smokeless tobacco: Never  Vaping Use   Vaping Use: Never used  Substance and Sexual Activity   Alcohol use: No   Drug use: No   Sexual activity: Yes  Other Topics Concern   Not on file  Social History Narrative   Not on file   Social Determinants of Health   Financial Resource Strain: Not on file  Food Insecurity: Not on file  Transportation  Needs: Not on file  Physical Activity: Not on file  Stress: Not on file  Social Connections: Not on file  Intimate Partner Violence: Not on file    Family History  Problem Relation Age of Onset   Diabetes Mother    Heart disease Mother    Diabetes Father    Colon cancer Neg Hx    Esophageal cancer Neg Hx    Stomach cancer Neg Hx    Rectal cancer Neg Hx      Review of Systems  Constitutional: Negative.  Negative for chills and fever.  HENT:  Positive for congestion.   Respiratory: Negative.  Negative for cough and shortness of breath.   Cardiovascular: Negative.  Negative for chest pain and palpitations.  Gastrointestinal:  Negative for  abdominal pain, diarrhea, nausea and vomiting.  Skin: Negative.  Negative for rash.  Neurological: Negative.  Negative for dizziness and headaches.  All other systems reviewed and are negative.   Today's Vitals   08/17/22 1610  BP: 136/86  Pulse: 85  Temp: 98.4 F (36.9 C)  TempSrc: Oral  SpO2: 96%  Weight: 183 lb 3 oz (83.1 kg)  Height: '5\' 9"'$  (1.753 m)   Body mass index is 27.05 kg/m. Wt Readings from Last 3 Encounters:  08/17/22 183 lb 3 oz (83.1 kg)  02/15/22 182 lb (82.6 kg)  08/17/21 191 lb (86.6 kg)    Physical Exam Vitals reviewed.  Constitutional:      Appearance: Normal appearance.  HENT:     Head: Normocephalic.  Eyes:     Extraocular Movements: Extraocular movements intact.  Cardiovascular:     Rate and Rhythm: Normal rate and regular rhythm.     Pulses: Normal pulses.     Heart sounds: Murmur heard.  Pulmonary:     Effort: Pulmonary effort is normal.     Breath sounds: Normal breath sounds.  Musculoskeletal:        General: Normal range of motion.     Cervical back: No tenderness.  Lymphadenopathy:     Cervical: No cervical adenopathy.  Skin:    General: Skin is warm and dry.  Neurological:     General: No focal deficit present.     Mental Status: He is alert and oriented to person, place, and time.  Psychiatric:        Mood and Affect: Mood normal.        Behavior: Behavior normal.     Results for orders placed or performed in visit on 08/17/22 (from the past 24 hour(s))  POCT HgB A1C     Status: Abnormal   Collection Time: 08/17/22  4:28 PM  Result Value Ref Range   Hemoglobin A1C 6.5 (A) 4.0 - 5.6 %   HbA1c POC (<> result, manual entry)     HbA1c, POC (prediabetic range)     HbA1c, POC (controlled diabetic range)      ASSESSMENT & PLAN: A total of 43 minutes was spent with the patient and counseling/coordination of care regarding preparing for this visit, review of most recent office visit notes, review of most recent blood work  results including interpretation of today's hemoglobin A1c, review of multiple chronic medical conditions under management, review of all medications, education on nutrition, prognosis, cardiovascular risks associated with hypertension and diabetes, documentation, need for follow-up.  Problem List Items Addressed This Visit       Cardiovascular and Mediastinum   Hypertension associated with diabetes ([place]) - Primary    Well-controlled hypertension. Continue  lisinopril 20 mg and amlodipine 5 mg daily. Well-controlled diabetes with hemoglobin A1c at 6.5. Continue metformin 500 mg daily and glipizide 5 mg daily Cardiovascular risks associated with hypertension and diabetes discussed. Diet and nutrition discussed. Follow-up in 6 months.      Relevant Orders   POCT HgB A1C (Completed)   Urine Microalbumin w/creat. ratio   Comprehensive metabolic panel   Lipid panel     Endocrine   Dyslipidemia associated with type 2 diabetes mellitus (HCC)    Stable.  Diet and nutrition discussed. Continue rosuvastatin 5 mg daily. The 10-year ASCVD risk score (Arnett DK, et al., 2019) is: 29.6%   Values used to calculate the score:     Age: 60 years     Sex: Male     Is Non-Hispanic African American: Yes     Diabetic: Yes     Tobacco smoker: No     Systolic Blood Pressure: 098 mmHg     Is BP treated: Yes     HDL Cholesterol: 58.6 mg/dL     Total Cholesterol: 147 mg/dL       Other Visit Diagnoses     Need for vaccination       Relevant Orders   Flu Vaccine QUAD High Dose(Fluad) (Completed)      Patient Instructions  Diabetes Mellitus and Nutrition, Adult When you have diabetes, or diabetes mellitus, it is very important to have healthy eating habits because your blood sugar (glucose) levels are greatly affected by what you eat and drink. Eating healthy foods in the right amounts, at about the same times every day, can help you: Manage your blood glucose. Lower your risk of heart  disease. Improve your blood pressure. Reach or maintain a healthy weight. What can affect my meal plan? Every person with diabetes is different, and each person has different needs for a meal plan. Your health care provider may recommend that you work with a dietitian to make a meal plan that is best for you. Your meal plan may vary depending on factors such as: The calories you need. The medicines you take. Your weight. Your blood glucose, blood pressure, and cholesterol levels. Your activity level. Other health conditions you have, such as heart or kidney disease. How do carbohydrates affect me? Carbohydrates, also called carbs, affect your blood glucose level more than any other type of food. Eating carbs raises the amount of glucose in your blood. It is important to know how many carbs you can safely have in each meal. This is different for every person. Your dietitian can help you calculate how many carbs you should have at each meal and for each snack. How does alcohol affect me? Alcohol can cause a decrease in blood glucose (hypoglycemia), especially if you use insulin or take certain diabetes medicines by mouth. Hypoglycemia can be a life-threatening condition. Symptoms of hypoglycemia, such as sleepiness, dizziness, and confusion, are similar to symptoms of having too much alcohol. Do not drink alcohol if: Your health care provider tells you not to drink. You are pregnant, may be pregnant, or are planning to become pregnant. If you drink alcohol: Limit how much you have to: 0-1 drink a day for women. 0-2 drinks a day for men. Know how much alcohol is in your drink. In the [place] one drink equals one 12 oz bottle of beer (355 mL), one 5 oz glass of wine (148 mL), or one 1 oz glass of hard liquor (44 mL). Keep yourself hydrated with water,  diet soda, or unsweetened iced tea. Keep in mind that regular soda, juice, and other mixers may contain a lot of sugar and must be counted as  carbs. What are tips for following this plan?  Reading food labels Start by checking the serving size on the Nutrition Facts label of packaged foods and drinks. The number of calories and the amount of carbs, fats, and other nutrients listed on the label are based on one serving of the item. Many items contain more than one serving per package. Check the total grams (g) of carbs in one serving. Check the number of grams of saturated fats and trans fats in one serving. Choose foods that have a low amount or none of these fats. Check the number of milligrams (mg) of salt (sodium) in one serving. Most people should limit total sodium intake to less than 2,300 mg per day. Always check the nutrition information of foods labeled as "low-fat" or "nonfat." These foods may be higher in added sugar or refined carbs and should be avoided. Talk to your dietitian to identify your daily goals for nutrients listed on the label. Shopping Avoid buying canned, pre-made, or processed foods. These foods tend to be high in fat, sodium, and added sugar. Shop around the outside edge of the grocery store. This is where you will most often find fresh fruits and vegetables, bulk grains, fresh meats, and fresh dairy products. Cooking Use low-heat cooking methods, such as baking, instead of high-heat cooking methods, such as deep frying. Cook using healthy oils, such as olive, canola, or sunflower oil. Avoid cooking with butter, cream, or high-fat meats. Meal planning Eat meals and snacks regularly, preferably at the same times every day. Avoid going long periods of time without eating. Eat foods that are high in fiber, such as fresh fruits, vegetables, beans, and whole grains. Eat 4-6 oz (112-168 g) of lean protein each day, such as lean meat, chicken, fish, eggs, or tofu. One ounce (oz) (28 g) of lean protein is equal to: 1 oz (28 g) of meat, chicken, or fish. 1 egg.  cup (62 g) of tofu. Eat some foods each day that  contain healthy fats, such as avocado, nuts, seeds, and fish. What foods should I eat? Fruits Berries. Apples. Oranges. Peaches. Apricots. Plums. Grapes. Mangoes. Papayas. Pomegranates. Kiwi. Cherries. Vegetables Leafy greens, including lettuce, spinach, kale, chard, collard greens, mustard greens, and cabbage. Beets. Cauliflower. Broccoli. Carrots. Green beans. Tomatoes. Peppers. Onions. Cucumbers. Brussels sprouts. Grains Whole grains, such as whole-wheat or whole-grain bread, crackers, tortillas, cereal, and pasta. Unsweetened oatmeal. Quinoa. Brown or wild rice. Meats and other proteins Seafood. Poultry without skin. Lean cuts of poultry and beef. Tofu. Nuts. Seeds. Dairy Low-fat or fat-free dairy products such as milk, yogurt, and cheese. The items listed above may not be a complete list of foods and beverages you can eat and drink. Contact a dietitian for more information. What foods should I avoid? Fruits Fruits canned with syrup. Vegetables Canned vegetables. Frozen vegetables with butter or cream sauce. Grains Refined white flour and flour products such as bread, pasta, snack foods, and cereals. Avoid all processed foods. Meats and other proteins Fatty cuts of meat. Poultry with skin. Breaded or fried meats. Processed meat. Avoid saturated fats. Dairy Full-fat yogurt, cheese, or milk. Beverages Sweetened drinks, such as soda or iced tea. The items listed above may not be a complete list of foods and beverages you should avoid. Contact a dietitian for more information. Questions to ask  a health care provider Do I need to meet with a certified diabetes care and education specialist? Do I need to meet with a dietitian? What number can I call if I have questions? When are the best times to check my blood glucose? Where to find more information: American Diabetes Association: diabetes.org Academy of Nutrition and Dietetics: eatright.[employer] of Diabetes and  Digestive and Kidney Diseases: [employer].is Association of Diabetes Care & Education Specialists: diabeteseducator.org Summary It is important to have healthy eating habits because your blood sugar (glucose) levels are greatly affected by what you eat and drink. It is important to use alcohol carefully. A healthy meal plan will help you manage your blood glucose and lower your risk of heart disease. Your health care provider may recommend that you work with a dietitian to make a meal plan that is best for you. This information is not intended to replace advice given to you by your health care provider. Make sure you discuss any questions you have with your health care provider. Document Revised: 03/18/2020 Document Reviewed: 03/18/2020 Elsevier Patient Education  [place], MD [facility]

## 2022-08-17 NOTE — Assessment & Plan Note (Signed)
Well-controlled hypertension. Continue lisinopril 20 mg and amlodipine 5 mg daily. Well-controlled diabetes with hemoglobin A1c at 6.5. Continue metformin 500 mg daily and glipizide 5 mg daily Cardiovascular risks associated with hypertension and diabetes discussed. Diet and nutrition discussed. Follow-up in 6 months.

## 2022-08-18 ENCOUNTER — Other Ambulatory Visit: Payer: Self-pay | Admitting: Emergency Medicine

## 2022-08-18 DIAGNOSIS — E1159 Type 2 diabetes mellitus with other circulatory complications: Secondary | ICD-10-CM

## 2022-08-18 LAB — COMPREHENSIVE METABOLIC PANEL
ALT: 19 U/L (ref 0–53)
AST: 21 U/L (ref 0–37)
Albumin: 4.3 g/dL (ref 3.5–5.2)
Alkaline Phosphatase: 56 U/L (ref 39–117)
BUN: 25 mg/dL — ABNORMAL HIGH (ref 6–23)
CO2: 23 mEq/L (ref 19–32)
Calcium: 9.3 mg/dL (ref 8.4–10.5)
Chloride: 102 mEq/L (ref 96–112)
Creatinine, Ser: 1.43 mg/dL (ref 0.40–1.50)
GFR: 50.63 mL/min — ABNORMAL LOW (ref >60.00)
Glucose, Bld: 123 mg/dL — ABNORMAL HIGH (ref 70–99)
Potassium: 4.3 mEq/L (ref 3.5–5.1)
Sodium: 139 mEq/L (ref 135–145)
Total Bilirubin: 0.4 mg/dL (ref 0.2–1.2)
Total Protein: 7.1 g/dL (ref 6.0–8.3)

## 2022-08-18 LAB — MICROALBUMIN / CREATININE URINE RATIO
Creatinine,U: 148 mg/dL
Microalb Creat Ratio: 3.2 mg/g (ref 0.0–30.0)
Microalb, Ur: 4.7 mg/dL — ABNORMAL HIGH (ref 0.0–1.9)

## 2022-08-18 LAB — LIPID PANEL
Cholesterol: 173 mg/dL (ref 0–200)
HDL: 62.2 mg/dL (ref >39.00)
LDL Cholesterol: 84 mg/dL (ref 0–99)
NonHDL: 111.13
Total CHOL/HDL Ratio: 3
Triglycerides: 138 mg/dL (ref 0.0–149.0)
VLDL: 27.6 mg/dL (ref 0.0–40.0)

## 2022-08-18 MED ORDER — DAPAGLIFLOZIN PROPANEDIOL 10 MG PO TABS: 10.0000 mg | ORAL_TABLET | Freq: Every day | ORAL | 3 refills | Status: DC

## 2022-11-09 ENCOUNTER — Encounter: Payer: Self-pay | Admitting: Gastroenterology

## 2022-12-20 ENCOUNTER — Other Ambulatory Visit: Payer: Self-pay | Admitting: Emergency Medicine

## 2022-12-20 DIAGNOSIS — E785 Hyperlipidemia, unspecified: Secondary | ICD-10-CM

## 2022-12-20 DIAGNOSIS — E1159 Type 2 diabetes mellitus with other circulatory complications: Secondary | ICD-10-CM

## 2022-12-20 DIAGNOSIS — I10 Essential (primary) hypertension: Secondary | ICD-10-CM

## 2022-12-29 ENCOUNTER — Telehealth: Payer: Self-pay | Admitting: Emergency Medicine

## 2022-12-29 MED ORDER — GLIPIZIDE 5 MG PO TABS: ORAL_TABLET | ORAL | 0 refills | Status: DC

## 2022-12-29 NOTE — Telephone Encounter (Signed)
Sent refill to [facility] 

## 2022-12-29 NOTE — Telephone Encounter (Signed)
Prescription Request  12/29/2022  LOV: 08/17/2022  What is the name of the medication or equipment? metFORMIN (GLUCOPHAGE-XR) 500 MG 24 hr table   Have you contacted your pharmacy to request a refill? No   Which pharmacy would you like this s [facility] - [place], [place] - [address] [place] [address] Phone: [phone] Fax: [phone]     Patient notified that their request is being sent to the clinical staff for review and that they should receive a response within 2 business days.   Please advise at Mobile [phone] (mobile)

## 2023-02-08 ENCOUNTER — Other Ambulatory Visit: Payer: Self-pay | Admitting: Emergency Medicine

## 2023-02-20 ENCOUNTER — Encounter: Payer: Self-pay | Admitting: Emergency Medicine

## 2023-02-20 ENCOUNTER — Ambulatory Visit (INDEPENDENT_AMBULATORY_CARE_PROVIDER_SITE_OTHER): Payer: No Typology Code available for payment source | Admitting: Emergency Medicine

## 2023-02-20 VITALS — BP 124/74 | HR 80 | Temp 98.6°F | Ht 69.0 in | Wt 190.0 lb

## 2023-02-20 DIAGNOSIS — Z1211 Encounter for screening for malignant neoplasm of colon: Secondary | ICD-10-CM | POA: Diagnosis not present

## 2023-02-20 DIAGNOSIS — I152 Hypertension secondary to endocrine disorders: Secondary | ICD-10-CM | POA: Diagnosis not present

## 2023-02-20 DIAGNOSIS — E1159 Type 2 diabetes mellitus with other circulatory complications: Secondary | ICD-10-CM | POA: Diagnosis not present

## 2023-02-20 DIAGNOSIS — N1831 Chronic kidney disease, stage 3a: Secondary | ICD-10-CM

## 2023-02-20 DIAGNOSIS — E785 Hyperlipidemia, unspecified: Secondary | ICD-10-CM

## 2023-02-20 DIAGNOSIS — Z7984 Long term (current) use of oral hypoglycemic drugs: Secondary | ICD-10-CM | POA: Diagnosis not present

## 2023-02-20 DIAGNOSIS — E1169 Type 2 diabetes mellitus with other specified complication: Secondary | ICD-10-CM | POA: Diagnosis not present

## 2023-02-20 LAB — CBC WITH DIFFERENTIAL/PLATELET
Basophils Absolute: 0.1 10*3/uL (ref 0.0–0.1)
Basophils Relative: 1.1 % (ref 0.0–3.0)
Eosinophils Absolute: 0.1 10*3/uL (ref 0.0–0.7)
Eosinophils Relative: 2.6 % (ref 0.0–5.0)
HCT: 41.9 % (ref 39.0–52.0)
Hemoglobin: 13.5 g/dL (ref 13.0–17.0)
Lymphocytes Relative: 33 % (ref 12.0–46.0)
Lymphs Abs: 1.7 10*3/uL (ref 0.7–4.0)
MCHC: 32.3 g/dL (ref 30.0–36.0)
MCV: 93.7 fl (ref 78.0–100.0)
Monocytes Absolute: 0.5 10*3/uL (ref 0.1–1.0)
Monocytes Relative: 10.9 % (ref 3.0–12.0)
Neutro Abs: 2.6 10*3/uL (ref 1.4–7.7)
Neutrophils Relative %: 52.4 % (ref 43.0–77.0)
Platelets: 198 10*3/uL (ref 150.0–400.0)
RBC: 4.47 Mil/uL (ref 4.22–5.81)
RDW: 13.7 % (ref 11.5–15.5)
WBC: 5 10*3/uL (ref 4.0–10.5)

## 2023-02-20 LAB — COMPREHENSIVE METABOLIC PANEL
ALT: 16 U/L (ref 0–53)
AST: 21 U/L (ref 0–37)
Albumin: 4.4 g/dL (ref 3.5–5.2)
Alkaline Phosphatase: 44 U/L (ref 39–117)
BUN: 18 mg/dL (ref 6–23)
CO2: 25 mEq/L (ref 19–32)
Calcium: 9.8 mg/dL (ref 8.4–10.5)
Chloride: 108 mEq/L (ref 96–112)
Creatinine, Ser: 1.26 mg/dL (ref 0.40–1.50)
GFR: 58.72 mL/min — ABNORMAL LOW (ref >60.00)
Glucose, Bld: 65 mg/dL — ABNORMAL LOW (ref 70–99)
Potassium: 3.7 mEq/L (ref 3.5–5.1)
Sodium: 139 mEq/L (ref 135–145)
Total Bilirubin: 0.6 mg/dL (ref 0.2–1.2)
Total Protein: 7.1 g/dL (ref 6.0–8.3)

## 2023-02-20 LAB — LIPID PANEL
Cholesterol: 138 mg/dL (ref 0–200)
HDL: 52.7 mg/dL (ref >39.00)
LDL Cholesterol: 67 mg/dL (ref 0–99)
NonHDL: 85.69
Total CHOL/HDL Ratio: 3
Triglycerides: 93 mg/dL (ref 0.0–149.0)
VLDL: 18.6 mg/dL (ref 0.0–40.0)

## 2023-02-20 LAB — POCT GLYCOSYLATED HEMOGLOBIN (HGB A1C): Hemoglobin A1C: 7.3 % — AB (ref 4.0–5.6)

## 2023-02-20 MED ORDER — TADALAFIL 20 MG PO TABS
20.0000 mg | ORAL_TABLET | Freq: Every day | ORAL | 5 refills | Status: DC | PRN
Start: 2023-02-20 — End: 2023-11-25

## 2023-02-20 MED ORDER — DAPAGLIFLOZIN PROPANEDIOL 10 MG PO TABS: 10.0000 mg | ORAL_TABLET | Freq: Every day | ORAL | 3 refills | Status: DC

## 2023-02-20 NOTE — Patient Instructions (Signed)

## 2023-02-20 NOTE — Assessment & Plan Note (Signed)
Chronic stable condition Lipid profile done today Continue rosuvastatin 5 mg daily Diet and nutrition discussed

## 2023-02-20 NOTE — Assessment & Plan Note (Signed)
Advised to stay well-hydrated and avoid NSAIDs Will benefit from starting Farxiga 10 mg daily

## 2023-02-20 NOTE — Assessment & Plan Note (Signed)
BP Readings from Last 3 Encounters:  02/20/23 124/74  08/17/22 136/86  02/15/22 132/84  Well-controlled hypertension Recommend to continue amlodipine 5 mg daily and lisinopril 20 mg daily Hemoglobin A1c higher than before at 7.3 Cardiovascular risks associated with uncontrolled diabetes discussed Diet and nutrition discussed Continue glipizide 5 mg daily and metformin 500 mg daily Start Farxiga 10 mg daily Follow-up in 6 months

## 2023-02-20 NOTE — Progress Notes (Signed)
Jacob Chan 67 y.o.   Chief Complaint  Patient presents with   Medical Management of Chronic Issues    F/u appt,  right hand swells in the am, left knee feels weak at times.   Having trouble sleeping at night     HISTORY OF PRESENT ILLNESS: This is a 67 y.o. male here for follow-up of diabetes and hypertension Also complaining of hoarse voice.  Complaining of dry throat in the mornings. Occasional right hand swelling in the morning and left knee weakness at times Having trouble sleeping  HPI   Prior to Admission medications   Medication Sig Start Date End Date Taking? Authorizing Provider  acetaminophen (TYLENOL) 500 MG tablet Take 1,000 mg by mouth every 6 (six) hours as needed. PAIN   Yes [provider]  amLODipine (NORVASC) 5 MG tablet take one tablet daily in the morning. 12/20/22  Yes Carl Butner, Eilleen Kempf, MD  ascorbic acid (VITAMIN C) 500 MG tablet Take 500 mg by mouth daily.   Yes [provider]  aspirin EC 81 MG tablet Take 81 mg by mouth daily.   Yes [provider]  dapagliflozin propanediol (FARXIGA) 10 MG TABS tablet Take 1 tablet (10 mg total) by mouth daily before breakfast. 08/18/22  Yes [name], Eilleen Kempf, MD  diclofenac sodium (VOLTAREN) 1 % GEL Apply 2 g topically 2 (two) times daily. 02/18/19  Yes Tydarius Yawn, Eilleen Kempf, MD  DM-APAP-CPM (CORICIDIN HBP PO) Take by mouth as needed.   Yes [provider]  glipiZIDE (GLUCOTROL) 5 MG tablet TAKE 1 TABLET ONCE A DAY BEFORE BREAKFAST. Follow-up appt due in June must see MD refills 12/29/22  Yes [name], Eilleen Kempf, MD  lisinopril (ZESTRIL) 20 MG tablet TAKE 1 TABLET ONCE DAILY. 12/20/22  Yes [name], MD  metFORMIN (GLUCOPHAGE-XR) 500 MG 24 hr tablet take 1 tablet with breakfast 12/20/22 03/20/23 Yes [name], Eilleen Kempf, MD  Multiple Vitamins-Minerals (CENTRUM SILVER PO) Take 1 tablet by mouth daily.   Yes [provider]  rosuvastatin (CRESTOR) 5 MG  tablet TAKE 1 TABLET ONCE DAILY. 12/20/22  Yes Jamila Slatten, Eilleen Kempf, MD  tadalafil (CIALIS) 20 MG tablet Take 1 tablet (20 mg total) by mouth daily as needed for erectile dysfunction. 02/20/23   [name], MD    No Known Allergies  Patient Active Problem List   Diagnosis Date Noted   Diverticulosis 02/15/2021   Chronic hoarseness 02/15/2021   Nonrheumatic aortic valve stenosis 02/15/2021   Hypertension associated with diabetes (HCC) 12/20/2016   Dyslipidemia associated with type 2 diabetes mellitus (HCC) 12/20/2016    Past Medical History:  Diagnosis Date   Chronic kidney disease    many kidney stones   Diabetes mellitus    Diabetes mellitus without complication (HCC)    [employer] 09/09/2020   Hypertension     Past Surgical History:  Procedure Laterality Date   LITHOTRIPSY     3 yrs ago    Social History   Socioeconomic History   Marital status: Married    Spouse name: [name]   Number of children: 4   Years of education: Not on file   Highest education level: Not on file  Occupational History   Not on file  Tobacco Use   Smoking status: Never   Smokeless tobacco: Never  Vaping Use   Vaping Use: Never used  Substance and Sexual Activity   Alcohol use: No   Drug use: No   Sexual activity: Yes  Other Topics Concern  Not on file  Social History Narrative   Not on file   Social Determinants of Health   Financial Resource Strain: Not on file  Food Insecurity: Not on file  Transportation Needs: Not on file  Physical Activity: Not on file  Stress: Not on file  Social Connections: Not on file  Intimate Partner Violence: Not on file    Family History  Problem Relation Age of Onset   Diabetes Mother    Heart disease Mother    Diabetes Father    Colon cancer Neg Hx    Esophageal cancer Neg Hx    Stomach cancer Neg Hx    Rectal cancer Neg Hx      Review of Systems  Constitutional: Negative.  Negative for chills and fever.  HENT: Negative.   Negative for congestion and sore throat.        Hoarse voice  Respiratory: Negative.  Negative for cough and shortness of breath.   Cardiovascular: Negative.  Negative for chest pain and palpitations.  Gastrointestinal:  Negative for abdominal pain, diarrhea, nausea and vomiting.  Musculoskeletal:  Positive for joint pain.  Skin: Negative.  Negative for rash.  Neurological: Negative.  Negative for dizziness and headaches.  All other systems reviewed and are negative.   Vitals:   02/20/23 1525  BP: 124/74  Pulse: 80  Temp: 98.6 F (37 C)  SpO2: 96%    Physical Exam Vitals reviewed.  Constitutional:      Appearance: Normal appearance.  HENT:     Head: Normocephalic.     Mouth/Throat:     Mouth: Mucous membranes are moist.     Pharynx: Oropharynx is clear.  Eyes:     Extraocular Movements: Extraocular movements intact.     Conjunctiva/sclera: Conjunctivae normal.     Pupils: Pupils are equal, round, and reactive to light.  Cardiovascular:     Rate and Rhythm: Normal rate and regular rhythm.     Pulses: Normal pulses.     Heart sounds: Normal heart sounds.  Pulmonary:     Effort: Pulmonary effort is normal.     Breath sounds: Normal breath sounds.  Musculoskeletal:        General: Normal range of motion.     Cervical back: No tenderness.     Comments: Right upper extremity: No swelling or abnormal findings Left knee: Full range of motion.  No swelling or tenderness.  Lymphadenopathy:     Cervical: No cervical adenopathy.  Skin:    General: Skin is warm and dry.     Capillary Refill: Capillary refill takes less than 2 seconds.  Neurological:     General: No focal deficit present.     Mental Status: He is alert and oriented to person, place, and time.  Psychiatric:        Mood and Affect: Mood normal.        Behavior: Behavior normal.    Results for orders placed or performed in visit on 02/20/23 (from the past 24 hour(s))  POCT HgB A1C     Status: Abnormal    Collection Time: 02/20/23  3:50 PM  Result Value Ref Range   Hemoglobin A1C 7.3 (A) 4.0 - 5.6 %   HbA1c POC (<> result, manual entry)     HbA1c, POC (prediabetic range)     HbA1c, POC (controlled diabetic range)       ASSESSMENT & PLAN: A total of 44 minutes was spent with the patient and counseling/coordination of care regarding preparing  for this visit, review of most recent office visit notes, review of multiple chronic medical conditions and their management, review of most recent blood work results including interpretation of today's hemoglobin A1c, review of all medications and changes made, cardiovascular risks associated with hypertension and diabetes, education on nutrition, prognosis, documentation and need for follow-up  Problem List Items Addressed This Visit       Cardiovascular and Mediastinum   Hypertension associated with diabetes (HCC) - Primary    BP Readings from Last 3 Encounters:  02/20/23 124/74  08/17/22 136/86  02/15/22 132/84  Well-controlled hypertension Recommend to continue amlodipine 5 mg daily and lisinopril 20 mg daily Hemoglobin A1c higher than before at 7.3 Cardiovascular risks associated with uncontrolled diabetes discussed Diet and nutrition discussed Continue glipizide 5 mg daily and metformin 500 mg daily Start Farxiga 10 mg daily Follow-up in 6 months       Relevant Medications   tadalafil (CIALIS) 20 MG tablet   dapagliflozin propanediol (FARXIGA) 10 MG TABS tablet   Other Relevant Orders   POCT HgB A1C (Completed)   CBC with Differential/Platelet   Comprehensive metabolic panel   Lipid panel     Endocrine   Dyslipidemia associated with type 2 diabetes mellitus (HCC)    Chronic stable condition Lipid profile done today Continue rosuvastatin 5 mg daily Diet and nutrition discussed      Relevant Medications   dapagliflozin propanediol (FARXIGA) 10 MG TABS tablet   Other Relevant Orders   POCT HgB A1C (Completed)   CBC with  Differential/Platelet   Comprehensive metabolic panel   Lipid panel     Genitourinary   Stage 3a chronic kidney disease (HCC)    Advised to stay well-hydrated and avoid NSAIDs Will benefit from starting Farxiga 10 mg daily      Relevant Medications   dapagliflozin propanediol (FARXIGA) 10 MG TABS tablet   Other Visit Diagnoses     Screening for colon cancer       Relevant Orders   Ambulatory referral to Gastroenterology         Patient Instructions  Diabetes Mellitus and Nutrition, Adult When you have diabetes, or diabetes mellitus, it is very important to have healthy eating habits because your blood sugar (glucose) levels are greatly affected by what you eat and drink. Eating healthy foods in the right amounts, at about the same times every day, can help you: Manage your blood glucose. Lower your risk of heart disease. Improve your blood pressure. Reach or maintain a healthy weight. What can affect my meal plan? Every person with diabetes is different, and each person has different needs for a meal plan. Your health care provider may recommend that you work with a dietitian to make a meal plan that is best for you. Your meal plan may vary depending on factors such as: The calories you need. The medicines you take. Your weight. Your blood glucose, blood pressure, and cholesterol levels. Your activity level. Other health conditions you have, such as heart or kidney disease. How do carbohydrates affect me? Carbohydrates, also called carbs, affect your blood glucose level more than any other type of food. Eating carbs raises the amount of glucose in your blood. It is important to know how many carbs you can safely have in each meal. This is different for every person. Your dietitian can help you calculate how many carbs you should have at each meal and for each snack. How does alcohol affect me? Alcohol can cause a decrease  in blood glucose (hypoglycemia), especially if you  use insulin or take certain diabetes medicines by mouth. Hypoglycemia can be a life-threatening condition. Symptoms of hypoglycemia, such as sleepiness, dizziness, and confusion, are similar to symptoms of having too much alcohol. Do not drink alcohol if: Your health care provider tells you not to drink. You are pregnant, may be pregnant, or are planning to become pregnant. If you drink alcohol: Limit how much you have to: 0-1 drink a day for women. 0-2 drinks a day for men. Know how much alcohol is in your drink. In the [place] one drink equals one 12 oz bottle of beer (355 mL), one 5 oz glass of wine (148 mL), or one 1 oz glass of hard liquor (44 mL). Keep yourself hydrated with water, diet soda, or unsweetened iced tea. Keep in mind that regular soda, juice, and other mixers may contain a lot of sugar and must be counted as carbs. What are tips for following this plan?  Reading food labels Start by checking the serving size on the Nutrition Facts label of packaged foods and drinks. The number of calories and the amount of carbs, fats, and other nutrients listed on the label are based on one serving of the item. Many items contain more than one serving per package. Check the total grams (g) of carbs in one serving. Check the number of grams of saturated fats and trans fats in one serving. Choose foods that have a low amount or none of these fats. Check the number of milligrams (mg) of salt (sodium) in one serving. Most people should limit total sodium intake to less than 2,300 mg per day. Always check the nutrition information of foods labeled as "low-fat" or "nonfat." These foods may be higher in added sugar or refined carbs and should be avoided. Talk to your dietitian to identify your daily goals for nutrients listed on the label. Shopping Avoid buying canned, pre-made, or processed foods. These foods tend to be high in fat, sodium, and added sugar. Shop around the outside edge of the  grocery store. This is where you will most often find fresh fruits and vegetables, bulk grains, fresh meats, and fresh dairy products. Cooking Use low-heat cooking methods, such as baking, instead of high-heat cooking methods, such as deep frying. Cook using healthy oils, such as olive, canola, or sunflower oil. Avoid cooking with butter, cream, or high-fat meats. Meal planning Eat meals and snacks regularly, preferably at the same times every day. Avoid going long periods of time without eating. Eat foods that are high in fiber, such as fresh fruits, vegetables, beans, and whole grains. Eat 4-6 oz (112-168 g) of lean protein each day, such as lean meat, chicken, fish, eggs, or tofu. One ounce (oz) (28 g) of lean protein is equal to: 1 oz (28 g) of meat, chicken, or fish. 1 egg.  cup (62 g) of tofu. Eat some foods each day that contain healthy fats, such as avocado, nuts, seeds, and fish. What foods should I eat? Fruits Berries. Apples. Oranges. Peaches. Apricots. Plums. Grapes. Mangoes. Papayas. Pomegranates. Kiwi. Cherries. Vegetables Leafy greens, including lettuce, spinach, kale, chard, collard greens, mustard greens, and cabbage. Beets. Cauliflower. Broccoli. Carrots. Green beans. Tomatoes. Peppers. Onions. Cucumbers. Brussels sprouts. Grains Whole grains, such as whole-wheat or whole-grain bread, crackers, tortillas, cereal, and pasta. Unsweetened oatmeal. Quinoa. Brown or wild rice. Meats and other proteins Seafood. Poultry without skin. Lean cuts of poultry and beef. Tofu. Nuts. Seeds. Dairy Low-fat or fat-free  dairy products such as milk, yogurt, and cheese. The items listed above may not be a complete list of foods and beverages you can eat and drink. Contact a dietitian for more information. What foods should I avoid? Fruits Fruits canned with syrup. Vegetables Canned vegetables. Frozen vegetables with butter or cream sauce. Grains Refined white flour and flour products  such as bread, pasta, snack foods, and cereals. Avoid all processed foods. Meats and other proteins Fatty cuts of meat. Poultry with skin. Breaded or fried meats. Processed meat. Avoid saturated fats. Dairy Full-fat yogurt, cheese, or milk. Beverages Sweetened drinks, such as soda or iced tea. The items listed above may not be a complete list of foods and beverages you should avoid. Contact a dietitian for more information. Questions to ask a health care provider Do I need to meet with a certified diabetes care and education specialist? Do I need to meet with a dietitian? What number can I call if I have questions? When are the best times to check my blood glucose? Where to find more information: American Diabetes Association: diabetes.org Academy of Nutrition and Dietetics: eatright[employer] of Diabetes and Digestive and Kidney Diseases: StageSync.si Association of Diabetes Care & Education Specialists: diabeteseducator.org Summary It is important to have healthy eating habits because your blood sugar (glucose) levels are greatly affected by what you eat and drink. It is important to use alcohol carefully. A healthy meal plan will help you manage your blood glucose and lower your risk of heart disease. Your health care provider may recommend that you work with a dietitian to make a meal plan that is best for you. This information is not intended to replace advice given to you by your health care provider. Make sure you discuss any questions you have with your health care provider. Document Revised: 03/18/2020 Document Reviewed: 03/18/2020 Elsevier Patient Education  2024 Elsevier Inc.    Edwina Barth, MD [facility]

## 2023-04-18 ENCOUNTER — Telehealth: Payer: Self-pay | Admitting: Radiology

## 2023-04-18 NOTE — Telephone Encounter (Signed)
Could not leave voice mail for patient to call back at [phone] to schedule Medicare Annual Wellness Visit    Last AWV:     Please schedule Sequential/Initial AWV with [facility] CMA

## 2023-04-21 ENCOUNTER — Ambulatory Visit (INDEPENDENT_AMBULATORY_CARE_PROVIDER_SITE_OTHER): Payer: No Typology Code available for payment source | Admitting: *Deleted

## 2023-04-21 ENCOUNTER — Other Ambulatory Visit: Payer: Self-pay | Admitting: Emergency Medicine

## 2023-04-21 VITALS — Ht 69.0 in | Wt 184.0 lb

## 2023-04-21 DIAGNOSIS — I152 Hypertension secondary to endocrine disorders: Secondary | ICD-10-CM | POA: Diagnosis not present

## 2023-04-21 DIAGNOSIS — Z7984 Long term (current) use of oral hypoglycemic drugs: Secondary | ICD-10-CM | POA: Diagnosis not present

## 2023-04-21 DIAGNOSIS — Z0000 Encounter for general adult medical examination without abnormal findings: Secondary | ICD-10-CM | POA: Diagnosis not present

## 2023-04-21 DIAGNOSIS — E1159 Type 2 diabetes mellitus with other circulatory complications: Secondary | ICD-10-CM

## 2023-04-21 DIAGNOSIS — Z1211 Encounter for screening for malignant neoplasm of colon: Secondary | ICD-10-CM | POA: Diagnosis not present

## 2023-04-21 MED ORDER — METFORMIN HCL ER 500 MG PO TB24
500.0000 mg | ORAL_TABLET | Freq: Every day | ORAL | 3 refills | Status: DC
Start: 2023-04-21 — End: 2024-01-24

## 2023-04-21 NOTE — Patient Instructions (Addendum)
[name] , Thank you for taking time to come for your Medicare Wellness Visit. I appreciate your ongoing commitment to your health goals. Please review the following plan we discussed and let me know if I can assist you in the future.   These are the goals we discussed:  Goals       Patient Stated (pt-stated)      Patient states he wants to be able to control his diabetes.         This is a list of the screening recommended for you and due dates:  Health Maintenance  Topic Date Due   Complete foot exam   12/09/2020   Eye exam for diabetics  03/27/2021   COVID-19 Vaccine (4 - 2023-24 season) 04/29/2022   Colon Cancer Screening  12/10/2022   Flu Shot  03/30/2023   Yearly kidney health urinalysis for diabetes  08/18/2023   Hemoglobin A1C  08/22/2023   Yearly kidney function blood test for diabetes  02/20/2024   Medicare Annual Wellness Visit  04/20/2024   Pneumonia Vaccine  Completed   Hepatitis C Screening  Completed   Zoster (Shingles) Vaccine  Completed   HPV Vaccine  Aged Out   DTaP/Tdap/Td vaccine  Discontinued   Cologuard (Stool DNA test)  Discontinued       Diabetes Mellitus and Foot Care Diabetes, also called diabetes mellitus, may cause problems with your feet and legs because of poor blood flow (circulation). Poor circulation may make your skin: Become thinner and drier. Break more easily. Heal more slowly. Peel and crack. You may also have nerve damage (neuropathy). This can cause decreased feeling in your legs and feet. This means that you may not notice minor injuries to your feet that could lead to more serious problems. Finding and treating problems early is the best way to prevent future foot problems. How to care for your feet Foot hygiene  Wash your feet daily with warm water and mild soap. Do not use hot water. Then, pat your feet and the areas between your toes until they are fully dry. Do not soak your feet. This can dry your skin. Trim your  toenails straight across. Do not dig under them or around the cuticle. File the edges of your nails with an emery board or nail file. Apply a moisturizing lotion or petroleum jelly to the skin on your feet and to dry, brittle toenails. Use lotion that does not contain alcohol and is unscented. Do not apply lotion between your toes. Shoes and socks Wear clean socks or stockings every day. Make sure they are not too tight. Do not wear knee-high stockings. These may decrease blood flow to your legs. Wear shoes that fit well and have enough cushioning. Always look in your shoes before you put them on to be sure there are no objects inside. To break in new shoes, wear them for just a few hours a day. This prevents injuries on your feet. Wounds, scrapes, corns, and calluses  Check your feet daily for blisters, cuts, bruises, sores, and redness. If you cannot see the bottom of your feet, use a mirror or ask someone for help. Do not cut off corns or calluses or try to remove them with medicine. If you find a minor scrape, cut, or break in the skin on your feet, keep it and the skin around it clean and dry. You may clean these areas with mild soap and water. Do not clean the area with peroxide, alcohol, or  iodine. If you have a wound, scrape, corn, or callus on your foot, look at it several times a day to make sure it is healing and not infected. Check for: Redness, swelling, or pain. Fluid or blood. Warmth. Pus or a bad smell. General tips Do not cross your legs. This may decrease blood flow to your feet. Do not use heating pads or hot water bottles on your feet. They may burn your skin. If you have lost feeling in your feet or legs, you may not know this is happening until it is too late. Protect your feet from hot and cold by wearing shoes, such as at the beach or on hot pavement. Schedule a complete foot exam at least once a year or more often if you have foot problems. Report any cuts, sores, or  bruises to your health care provider right away. Where to find more information American Diabetes Association: diabetes.org Association of Diabetes Care & Education Specialists: diabeteseducator.org Contact a health care provider if: You have a condition that increases your risk of infection, and you have any cuts, sores, or bruises on your feet. You have an injury that is not healing. You have redness on your legs or feet. You feel burning or tingling in your legs or feet. You have pain or cramps in your legs and feet. Your legs or feet are numb. Your feet always feel cold. You have pain around any toenails. Get help right away if: You have a wound, scrape, corn, or callus on your foot and: You have signs of infection. You have a fever. You have a red line going up your leg. This information is not intended to replace advice given to you by your health care provider. Make sure you discuss any questions you have with your health care provider. Document Revised: 02/16/2022 Document Reviewed: 02/16/2022 Elsevier Patient Education  2024 [employer]  Health Maintenance, Male Adopting a healthy lifestyle and getting preventive care are important in promoting health and wellness. Ask your health care provider about: The right schedule for you to have regular tests and exams. Things you can do on your own to prevent diseases and keep yourself healthy. What should I know about diet, weight, and exercise? Eat a healthy diet  Eat a diet that includes plenty of vegetables, fruits, low-fat dairy products, and lean protein. Do not eat a lot of foods that are high in solid fats, added sugars, or sodium. Maintain a healthy weight Body mass index (BMI) is a measurement that can be used to identify possible weight problems. It estimates body fat based on height and weight. Your health care provider can help determine your BMI and help you achieve or maintain a healthy weight. Get regular  exercise Get regular exercise. This is one of the most important things you can do for your health. Most adults should: Exercise for at least 150 minutes each week. The exercise should increase your heart rate and make you sweat (moderate-intensity exercise). Do strengthening exercises at least twice a week. This is in addition to the moderate-intensity exercise. Spend less time sitting. Even light physical activity can be beneficial. Watch cholesterol and blood lipids Have your blood tested for lipids and cholesterol at 68 years of age, then have this test every 5 years. You may need to have your cholesterol levels checked more often if: Your lipid or cholesterol levels are high. You are older than 68 years of age. You are at high risk for heart disease. What should  I know about cancer screening? Many types of cancers can be detected early and may often be prevented. Depending on your health history and family history, you may need to have cancer screening at various ages. This may include screening for: Colorectal cancer. Prostate cancer. Skin cancer. Lung cancer. What should I know about heart disease, diabetes, and high blood pressure? Blood pressure and heart disease High blood pressure causes heart disease and increases the risk of stroke. This is more likely to develop in people who have high blood pressure readings or are overweight. Talk with your health care provider about your target blood pressure readings. Have your blood pressure checked: Every 3-5 years if you are 84-38 years of age. Every year if you are 12 years old or older. If you are between the ages of 77 and 80 and are a current or former smoker, ask your health care provider if you should have a one-time screening for abdominal aortic aneurysm (AAA). Diabetes Have regular diabetes screenings. This checks your fasting blood sugar level. Have the screening done: Once every three years after age 12 if you are at a  normal weight and have a low risk for diabetes. More often and at a younger age if you are overweight or have a high risk for diabetes. What should I know about preventing infection? Hepatitis B If you have a higher risk for hepatitis B, you should be screened for this virus. Talk with your health care provider to find out if you are at risk for hepatitis B infection. Hepatitis C Blood testing is recommended for: Everyone born from [birth year] through 1966. Anyone with known risk factors for hepatitis C. Sexually transmitted infections (STIs) You should be screened each year for STIs, including gonorrhea and chlamydia, if: You are sexually active and are younger than 68 years of age. You are older than 68 years of age and your health care provider tells you that you are at risk for this type of infection. Your sexual activity has changed since you were last screened, and you are at increased risk for chlamydia or gonorrhea. Ask your health care provider if you are at risk. Ask your health care provider about whether you are at high risk for HIV. Your health care provider may recommend a prescription medicine to help prevent HIV infection. If you choose to take medicine to prevent HIV, you should first get tested for HIV. You should then be tested every 3 months for as long as you are taking the medicine. Follow these instructions at home: Alcohol use Do not drink alcohol if your health care provider tells you not to drink. If you drink alcohol: Limit how much you have to 0-2 drinks a day. Know how much alcohol is in your drink. In the [place] one drink equals one 12 oz bottle of beer (355 mL), one 5 oz glass of wine (148 mL), or one 1 oz glass of hard liquor (44 mL). Lifestyle Do not use any products that contain nicotine or tobacco. These products include cigarettes, chewing tobacco, and vaping devices, such as e-cigarettes. If you need help quitting, ask your health care provider. Do not use street  drugs. Do not share needles. Ask your health care provider for help if you need support or information about quitting drugs. General instructions Schedule regular health, dental, and eye exams. Stay current with your vaccines. Tell your health care provider if: You often feel depressed. You have ever been abused or do not feel safe at  home. Summary Adopting a healthy lifestyle and getting preventive care are important in promoting health and wellness. Follow your health care provider's instructions about healthy diet, exercising, and getting tested or screened for diseases. Follow your health care provider's instructions on monitoring your cholesterol and blood pressure. This information is not intended to replace advice given to you by your health care provider. Make sure you discuss any questions you have with your health care provider. Document Revised: 01/04/2021 Document Reviewed: 01/04/2021 Elsevier Patient Education  2024 [employer].

## 2023-04-21 NOTE — Progress Notes (Signed)
Subjective:   Jacob Chan is a 67 y.o. male who presents for an Initial Medicare Annual Wellness Visit.  Visit Complete: Virtual  I connected with  Jacob Chan on 04/21/23 by a audio enabled telemedicine application and verified that I am speaking with the correct person using two identifiers.  Patient Location: Other:  in patient car  Provider Location: Office/Clinic  I discussed the limitations of evaluation and management by telemedicine. The patient expressed understanding and agreed to proceed.  Patient Medicare AWV questionnaire was  not completed by the patient   Review of Systems    Defer to PCP  Cardiac Risk Factors include: advanced age (>90men, >51 women);diabetes mellitus;male gender    Please see problem list for additional risk factors  Vital Signs: Unable to obtain new vitals due to this being a telehealth visit.  Objective:    Today's Vitals   04/21/23 1535  Weight: 184 lb (83.5 kg)  Height: 5\' 9"  (1.753 m)   Body mass index is 27.17 kg/m.     04/21/2023    4:00 PM 04/21/2023    3:48 PM 02/23/2012   12:54 PM  Advanced Directives  Does Patient Have a Medical Advance Directive?  No Patient does not have advance directive;Patient would not like information  Would patient like information on creating a medical advance directive? Yes (MAU/Ambulatory/Procedural Areas - Information given) No - Patient declined   Pre-existing out of facility DNR order (yellow form or pink MOST form)   No    Current Medications (verified) Outpatient Encounter Medications as of 04/21/2023  Medication Sig   acetaminophen (TYLENOL) 500 MG tablet Take 1,000 mg by mouth every 6 (six) hours as needed. PAIN   amLODipine (NORVASC) 5 MG tablet take one tablet daily in the morning.   ascorbic acid (VITAMIN C) 500 MG tablet Take 500 mg by mouth daily.   aspirin EC 81 MG tablet Take 81 mg by mouth daily.   dapagliflozin propanediol (FARXIGA) 10 MG TABS tablet Take 1 tablet  (10 mg total) by mouth daily before breakfast.   diclofenac sodium (VOLTAREN) 1 % GEL Apply 2 g topically 2 (two) times daily.   DM-APAP-CPM (CORICIDIN HBP PO) Take by mouth as needed.   glipiZIDE (GLUCOTROL) 5 MG tablet TAKE 1 TABLET ONCE A DAY BEFORE BREAKFAST. Follow-up appt due in June must see MD refills   lisinopril (ZESTRIL) 20 MG tablet TAKE 1 TABLET ONCE DAILY.   Multiple Vitamins-Minerals (CENTRUM SILVER PO) Take 1 tablet by mouth daily.   rosuvastatin (CRESTOR) 5 MG tablet TAKE 1 TABLET ONCE DAILY.   tadalafil (CIALIS) 20 MG tablet Take 1 tablet (20 mg total) by mouth daily as needed for erectile dysfunction.   metFORMIN (GLUCOPHAGE-XR) 500 MG 24 hr tablet Take 1 tablet (500 mg total) by mouth daily.   [DISCONTINUED] metFORMIN (GLUCOPHAGE-XR) 500 MG 24 hr tablet take 1 tablet with breakfast   Facility-Administered Encounter Medications as of 04/21/2023  Medication   0.9 %  sodium chloride infusion    Allergies (verified) Patient has no known allergies.   History: Past Medical History:  Diagnosis Date   Chronic kidney disease    many kidney stones   Diabetes mellitus    Diabetes mellitus without complication (HCC)    Phreesia 09/09/2020   Hypertension    Past Surgical History:  Procedure Laterality Date   LITHOTRIPSY     3 yrs ago   Family History  Problem Relation Age of Onset   Diabetes Mother  Heart disease Mother    Diabetes Father    Colon cancer Neg Hx    Esophageal cancer Neg Hx    Stomach cancer Neg Hx    Rectal cancer Neg Hx    Social History   Socioeconomic History   Marital status: Married    Spouse name: [name]   Number of children: 4   Years of education: Not on file   Highest education level: Not on file  Occupational History   Not on file  Tobacco Use   Smoking status: Never   Smokeless tobacco: Never  Vaping Use   Vaping status: Never Used  Substance and Sexual Activity   Alcohol use: No   Drug use: No   Sexual activity: Yes   Other Topics Concern   Not on file  Social History Narrative   Not on file   Social Determinants of Health   Financial Resource Strain: Low Risk  (04/21/2023)   Overall Financial Resource Strain (CARDIA)    Difficulty of Paying Living Expenses: Not hard at all  Food Insecurity: No Food Insecurity (04/21/2023)   Hunger Vital Sign    Worried About Running Out of Food in the Last Year: Never true    Ran Out of Food in the Last Year: Never true  Transportation Needs: No Transportation Needs (04/21/2023)   PRAPARE - Administrator, Civil Service (Medical): No    Lack of Transportation (Non-Medical): No  Physical Activity: Sufficiently Active (04/21/2023)   Exercise Vital Sign    Days of Exercise per Week: 5 days    Minutes of Exercise per Session: 40 min  Stress: Stress Concern Present (04/21/2023)   [employer] of Occupational Health - Occupational Stress Questionnaire    Feeling of Stress : To some extent  Social Connections: Socially Integrated (04/21/2023)   Social Connection and Isolation Panel [NHANES]    Frequency of Communication with Friends and Family: Twice a week    Frequency of Social Gatherings with Friends and Family: Three times a week    Attends Religious Services: More than 4 times per year    Active Member of Clubs or Organizations: Yes    Attends Engineer, structural: More than 4 times per year    Marital Status: Married    Tobacco Counseling Counseling given: Not Answered   Clinical Intake:  Pre-visit preparation completed: Yes  Pain : No/denies pain     Nutritional Status: BMI 25 -29 Overweight Nutritional Risks: None Diabetes: Yes CBG done?: Yes CBG resulted in Enter/ Edit results?: No Did pt. bring in CBG monitor from home?: No  How often do you need to have someone help you when you read instructions, pamphlets, or other written materials from your doctor or pharmacy?: 1 - Never What is the last grade level you completed  in school?: masters degree  Interpreter Needed?: No      Activities of Daily Living    04/21/2023    3:48 PM  In your present state of health, do you have any difficulty performing the following activities:  Hearing? 0  Vision? 0  Difficulty concentrating or making decisions? 0  Walking or climbing stairs? 0  Dressing or bathing? 0  Doing errands, shopping? 0  Preparing Food and eating ? N  Using the Toilet? N  In the past six months, have you accidently leaked urine? N  Do you have problems with loss of bowel control? N  Managing your Finances? N  Housekeeping or managing  your Housekeeping? N    Patient Care Team: [name], MD as PCP - General (Internal Medicine)  Indicate any recent Medical Services you may have received from other than [facility] providers in the past year (date may be approximate).     Assessment:   This is a routine wellness examination for [place].  Hearing/Vision screen Vision Screening - Comments:: Dr. Dione Booze, patient is not update on annual exam  Dietary issues and exercise activities discussed:     Goals Addressed               This Visit's Progress     Patient Stated (pt-stated)        Patient states he wants to be able to control his diabetes.       Depression Screen    04/21/2023    3:50 PM 04/21/2023    3:48 PM 04/21/2023    3:46 PM 02/20/2023    3:28 PM 08/17/2022    4:11 PM 02/15/2022    4:17 PM 09/23/2020    4:43 PM  PHQ 2/9 Scores  PHQ - 2 Score 0 0 0 0 0 0 0    Fall Risk    04/21/2023    3:48 PM 02/20/2023    3:28 PM 08/17/2022    4:11 PM 02/15/2022    4:17 PM 02/15/2021    8:12 AM  Fall Risk   Falls in the past year? 0 0 0 1 1  Number falls in past yr: 0 0 0 0 0  Injury with Fall? 0 0 0 0 0  Risk for fall due to : No Fall Risks No Fall Risks No Fall Risks  No Fall Risks  Follow up Falls evaluation completed Falls evaluation completed Falls evaluation completed  Falls evaluation completed    MEDICARE  RISK AT HOME: Medicare Risk at Home Any stairs in or around the home?: Yes If so, are there any without handrails?: Yes Home free of loose throw rugs in walkways, pet beds, electrical cords, etc?: Yes Adequate lighting in your home to reduce risk of falls?: Yes Life alert?: No Use of a cane, walker or w/c?: No Grab bars in the bathroom?: No Shower chair or bench in shower?: No Elevated toilet seat or a handicapped toilet?: No  TIMED UP AND GO:  Was the test performed? No    Cognitive Function:        04/21/2023    3:49 PM  6CIT Screen  What Year? 0 points  What month? 0 points  What time? 0 points  Count back from 20 0 points  Months in reverse 0 points  Repeat phrase 0 points  Total Score 0 points    Immunizations Immunization History  Administered Date(s) Administered   Fluad Quad(high Dose 65+) 08/17/2022   Influenza,inj,Quad PF,6+ Mos 09/23/2019   PFIZER(Purple Top)SARS-COV-2 Vaccination 11/03/2019, 12/04/2019   PNEUMOCOCCAL CONJUGATE-20 06/26/2021   Unspecified SARS-COV-2 Vaccination 08/26/2021   Zoster Recombinant(Shingrix) 06/17/2021, 08/26/2021    TDAP status: Due, Education has been provided regarding the importance of this vaccine. Advised may receive this vaccine at local pharmacy or Health Dept. Aware to provide a copy of the vaccination record if obtained from local pharmacy or Health Dept. Verbalized acceptance and understanding.  Flu Vaccine status: Due, Education has been provided regarding the importance of this vaccine. Advised may receive this vaccine at local pharmacy or Health Dept. Aware to provide a copy of the vaccination record if obtained from local pharmacy or Health Dept. Verbalized  acceptance and understanding.  Pneumococcal vaccine status: Up to date  Covid-19 vaccine status: Completed vaccines  Qualifies for Shingles Vaccine? Yes   Zostavax completed Yes   Shingrix Completed?: Yes  Screening Tests Health Maintenance  Topic Date  Due   FOOT EXAM  12/09/2020   OPHTHALMOLOGY EXAM  03/27/2021   COVID-19 Vaccine (4 - 2023-24 season) 04/29/2022   Colonoscopy  12/10/2022   INFLUENZA VACCINE  03/30/2023   Diabetic kidney evaluation - Urine ACR  08/18/2023   HEMOGLOBIN A1C  08/22/2023   Diabetic kidney evaluation - eGFR measurement  02/20/2024   Medicare Annual Wellness (AWV)  04/20/2024   Pneumonia Vaccine 28+ Years old  Completed   Hepatitis C Screening  Completed   Zoster Vaccines- Shingrix  Completed   HPV VACCINES  Aged Out   DTaP/Tdap/Td  Discontinued   Fecal DNA (Cologuard)  Discontinued    Health Maintenance  Health Maintenance Due  Topic Date Due   FOOT EXAM  12/09/2020   OPHTHALMOLOGY EXAM  03/27/2021   COVID-19 Vaccine (4 - 2023-24 season) 04/29/2022   Colonoscopy  12/10/2022   INFLUENZA VACCINE  03/30/2023    Colorectal cancer screening: Referral to GI placed 04/21/2023. Pt aware the office will call re: appt.  Lung Cancer Screening: (Low Dose CT Chest recommended if Age 32-80 years, 20 pack-year currently smoking OR have quit w/in 15years.) does not qualify.   Lung Cancer Screening Referral: n/a  Additional Screening:  Hepatitis C Screening: does qualify; Completed 01/18/2018  Vision Screening: Recommended annual ophthalmology exams for early detection of glaucoma and other disorders of the eye. Is the patient up to date with their annual eye exam?  No  Who is the provider or what is the name of the office in which the patient attends annual eye exams? Dr. Dione Booze If pt is not established with a provider, would they like to be referred to a provider to establish care? No .   Dental Screening: Recommended annual dental exams for proper oral hygiene  Diabetic Foot Exam: Diabetic Foot Exam: Overdue, Pt has been advised about the importance in completing this exam. Pt is scheduled for diabetic foot exam on 08/16/2023.  Community Resource Referral / Chronic Care Management: CRR required this  visit?  No   CCM required this visit?  No    Plan:     I have personally reviewed and noted the following in the patient's chart:   Medical and social history Use of alcohol, tobacco or illicit drugs  Current medications and supplements including opioid prescriptions. Patient is not currently taking opioid prescriptions. Functional ability and status Nutritional status Physical activity Advanced directives List of other physicians Hospitalizations, surgeries, and ER visits in previous 12 months Vitals Screenings to include cognitive, depression, and falls Referrals and appointments  In addition, I have reviewed and discussed with patient certain preventive protocols, quality metrics, and best practice recommendations. A written personalized care plan for preventive services as well as general preventive health recommendations were provided to patient.     Tamela Oddi, CMA   04/21/2023   Non face to face 30 minutes  After Visit Summary: (MyChart) Due to this being a telephonic visit, the after visit summary with patients personalized plan was offered to patient via MyChart   Nurse Notes:  Mr. Kaupp , Thank you for taking time to come for your Medicare Wellness Visit. I appreciate your ongoing commitment to your health goals. Please review the following plan we discussed and let me know  if I can assist you in the future.   These are the goals we discussed:  Goals       Patient Stated (pt-stated)      Patient states he wants to be able to control his diabetes.         This is a list of the screening recommended for you and due dates:  Health Maintenance  Topic Date Due   Complete foot exam   12/09/2020   Eye exam for diabetics  03/27/2021   COVID-19 Vaccine (4 - 2023-24 season) 04/29/2022   Colon Cancer Screening  12/10/2022   Flu Shot  03/30/2023   Yearly kidney health urinalysis for diabetes  08/18/2023   Hemoglobin A1C  08/22/2023   Yearly kidney  function blood test for diabetes  02/20/2024   Medicare Annual Wellness Visit  04/20/2024   Pneumonia Vaccine  Completed   Hepatitis C Screening  Completed   Zoster (Shingles) Vaccine  Completed   HPV Vaccine  Aged Out   DTaP/Tdap/Td vaccine  Discontinued   Cologuard (Stool DNA test)  Discontinued

## 2023-06-06 ENCOUNTER — Encounter: Payer: Self-pay | Admitting: Emergency Medicine

## 2023-06-21 DIAGNOSIS — E785 Hyperlipidemia, unspecified: Secondary | ICD-10-CM | POA: Diagnosis not present

## 2023-06-21 DIAGNOSIS — E663 Overweight: Secondary | ICD-10-CM | POA: Diagnosis not present

## 2023-06-21 DIAGNOSIS — N529 Male erectile dysfunction, unspecified: Secondary | ICD-10-CM | POA: Diagnosis not present

## 2023-06-21 DIAGNOSIS — Z6826 Body mass index (BMI) 26.0-26.9, adult: Secondary | ICD-10-CM | POA: Diagnosis not present

## 2023-06-21 DIAGNOSIS — Z008 Encounter for other general examination: Secondary | ICD-10-CM | POA: Diagnosis not present

## 2023-06-21 DIAGNOSIS — I10 Essential (primary) hypertension: Secondary | ICD-10-CM | POA: Diagnosis not present

## 2023-06-30 ENCOUNTER — Encounter (INDEPENDENT_AMBULATORY_CARE_PROVIDER_SITE_OTHER): Payer: Self-pay | Admitting: Otolaryngology

## 2023-07-03 ENCOUNTER — Encounter: Payer: Self-pay | Admitting: Gastroenterology

## 2023-07-07 ENCOUNTER — Ambulatory Visit (AMBULATORY_SURGERY_CENTER): Payer: No Typology Code available for payment source

## 2023-07-07 VITALS — Ht 69.0 in | Wt 188.0 lb

## 2023-07-07 DIAGNOSIS — Z860100 Personal history of colon polyps, unspecified: Secondary | ICD-10-CM

## 2023-07-07 MED ORDER — NA SULFATE-K SULFATE-MG SULF 17.5-3.13-1.6 GM/177ML PO SOLN: 1.0000 | Freq: Once | ORAL | 0 refills | Status: AC

## 2023-07-07 NOTE — Progress Notes (Signed)

## 2023-07-12 ENCOUNTER — Encounter: Payer: Self-pay | Admitting: Gastroenterology

## 2023-07-24 ENCOUNTER — Ambulatory Visit: Payer: No Typology Code available for payment source | Admitting: Gastroenterology

## 2023-07-24 ENCOUNTER — Encounter: Payer: Self-pay | Admitting: Gastroenterology

## 2023-07-24 VITALS — BP 101/62 | HR 82 | Temp 98.0°F | Resp 13 | Ht 69.0 in | Wt 188.0 lb

## 2023-07-24 DIAGNOSIS — K642 Third degree hemorrhoids: Secondary | ICD-10-CM | POA: Diagnosis not present

## 2023-07-24 DIAGNOSIS — D120 Benign neoplasm of cecum: Secondary | ICD-10-CM

## 2023-07-24 DIAGNOSIS — Z860101 Personal history of adenomatous and serrated colon polyps: Secondary | ICD-10-CM | POA: Diagnosis not present

## 2023-07-24 DIAGNOSIS — E119 Type 2 diabetes mellitus without complications: Secondary | ICD-10-CM | POA: Diagnosis not present

## 2023-07-24 DIAGNOSIS — K5730 Diverticulosis of large intestine without perforation or abscess without bleeding: Secondary | ICD-10-CM | POA: Diagnosis not present

## 2023-07-24 DIAGNOSIS — I10 Essential (primary) hypertension: Secondary | ICD-10-CM | POA: Diagnosis not present

## 2023-07-24 DIAGNOSIS — K644 Residual hemorrhoidal skin tags: Secondary | ICD-10-CM | POA: Diagnosis not present

## 2023-07-24 DIAGNOSIS — D123 Benign neoplasm of transverse colon: Secondary | ICD-10-CM

## 2023-07-24 DIAGNOSIS — Z1211 Encounter for screening for malignant neoplasm of colon: Secondary | ICD-10-CM | POA: Diagnosis not present

## 2023-07-24 DIAGNOSIS — Z860100 Personal history of colon polyps, unspecified: Secondary | ICD-10-CM

## 2023-07-24 MED ORDER — SODIUM CHLORIDE 0.9 % IV SOLN: 500.0000 mL | Freq: Once | INTRAVENOUS | Status: DC

## 2023-07-24 NOTE — Patient Instructions (Signed)
YOU HAD AN ENDOSCOPIC PROCEDURE TODAY: Refer to the procedure report and other information in the discharge instructions given to you for any specific questions about what was found during the examination. If this information does not answer your questions, please call [facility] office at [phone] to clarify.   YOU SHOULD EXPECT: Some feelings of bloating in the abdomen. Passage of more gas than usual. Walking can help get rid of the air that was put into your GI tract during the procedure and reduce the bloating. If you had a lower endoscopy (such as a colonoscopy or flexible sigmoidoscopy) you may notice spotting of blood in your stool or on the toilet paper. Some abdominal soreness may be present for a day or two, also.  DIET: Your first meal following the procedure should be a light meal and then it is ok to progress to your normal diet. A half-sandwich or bowl of soup is an example of a good first meal. Heavy or fried foods are harder to digest and may make you feel nauseous or bloated. Drink plenty of fluids but you should avoid alcoholic beverages for 24 hours. If you had a esophageal dilation, please see attached instructions for diet.    ACTIVITY: Your care partner should take you home directly after the procedure. You should plan to take it easy, moving slowly for the rest of the day. You can resume normal activity the day after the procedure however YOU SHOULD NOT DRIVE, use power tools, machinery or perform tasks that involve climbing or major physical exertion for 24 hours (because of the sedation medicines used during the test).   SYMPTOMS TO REPORT IMMEDIATELY: A gastroenterologist can be reached at any hour. Please call [phone]  for any of the following symptoms:  Following lower endoscopy (colonoscopy, flexible sigmoidoscopy) Excessive amounts of blood in the stool  Significant tenderness, worsening of abdominal pains  Swelling of the abdomen that is new, acute  Fever of 100 or  higher   FOLLOW UP:  If any biopsies were taken you will be contacted by phone or by letter within the next 1-3 weeks. Call [phone]  if you have not heard about the biopsies in 3 weeks.  Please also call with any specific questions about appointments or follow up tests.

## 2023-07-24 NOTE — Progress Notes (Signed)
Pt resting comfortably. VSS. Airway intact. SBAR complete to RN. All questions answered.   

## 2023-07-24 NOTE — Op Note (Signed)
[facility] Patient Name: Jacob Chan Procedure Date: 07/24/2023 9:10 AM MRN: [id number] Endoscopist: Sherilyn Cooter L. Myrtie Neither , MD, [id number] Age: 67 Referring MD:  Date of Birth: 06-14-1956 Gender: Male Account #: [id number] Procedure:                Colonoscopy Indications:              High risk colon cancer surveillance: Personal                            history of sessile serrated colon polyp (less than                            10 mm in size) with no dysplasia                           8mm cecal [facility] April 2021 colonoscopy done for                            positive cologuard Medicines:                Monitored Anesthesia Care Procedure:                Pre-Anesthesia Assessment:                           - Prior to the procedure, a History and Physical                            was performed, and patient medications and                            allergies were reviewed. The patient's tolerance of                            previous anesthesia was also reviewed. The risks                            and benefits of the procedure and the sedation                            options and risks were discussed with the patient.                            All questions were answered, and informed consent                            was obtained. Prior Anticoagulants: The patient has                            taken no anticoagulant or antiplatelet agents. ASA                            Grade Assessment: III - A patient with severe  systemic disease. After reviewing the risks and                            benefits, the patient was deemed in satisfactory                            condition to undergo the procedure.                           After obtaining informed consent, the colonoscope                            was passed under direct vision. Throughout the                            procedure, the patient's blood pressure, pulse, and                             oxygen saturations were monitored continuously. The                            Olympus Scope SN [id number] was introduced through the                            anus and advanced to the the cecum, identified by                            appendiceal orifice and ileocecal valve. The                            colonoscopy was performed without difficulty. The                            patient tolerated the procedure well. The quality                            of the bowel preparation was excellent. The                            ileocecal valve, appendiceal orifice, and rectum                            were photographed. Scope In: 9:21:32 AM Scope Out: 9:35:06 AM Scope Withdrawal Time: 0 hours 10 minutes 39 seconds  Total Procedure Duration: 0 hours 13 minutes 34 seconds  Findings:                 Hemorrhoids were found on perianal exam.                           Repeat examination of right colon under NBI                            performed.  Two sessile and semi-sessile polyps were found in                            the cecum. The polyps were diminutive in size (and                            one with a tiny overlygin erosion). These polyps                            were removed with a cold snare. Resection and                            retrieval were complete. (Jar 1)                           A diminutive polyp was found in the transverse                            colon. The polyp was semi-sessile (with a tiny                            overlying erosion). The polyp was removed with a                            cold snare. Resection and retrieval were complete.                            (Jar 2)                           Multiple diverticula were found in the left colon                            and right colon.                           Internal hemorrhoids were found. The hemorrhoids                            were Grade III (internal  hemorrhoids that prolapse                            but require manual reduction).                           The exam was otherwise without abnormality on                            direct and retroflexion views. Complications:            No immediate complications. Estimated Blood Loss:     Estimated blood loss was minimal. Impression:               - Hemorrhoids found on perianal exam.                           -  Two diminutive polyps in the cecum, removed with                            a cold snare. Resected and retrieved.                           - One diminutive polyp in the transverse colon,                            removed with a cold snare. Resected and retrieved.                           - Diverticulosis in the left colon and in the right                            colon.                           - Internal hemorrhoids.                           - The examination was otherwise normal on direct                            and retroflexion views.                           - Return to my office if hemorrhoids are                            symptomatic and treatment is desired. Recommendation:           - Patient has a contact number available for                            emergencies. The signs and symptoms of potential                            delayed complications were discussed with the                            patient. Return to normal activities tomorrow.                            Written discharge instructions were provided to the                            patient.                           - Resume previous diet.                           - Continue present medications.                           -  Await pathology results.                           - Repeat colonoscopy is recommended for                            surveillance. The colonoscopy date will be                            determined after pathology results from today's                            exam  become available for review. Jelan Batterton L. Myrtie Neither, MD 07/24/2023 9:46:19 AM This report has been signed electronically.

## 2023-07-24 NOTE — Progress Notes (Signed)
Pt's states no medical or surgical changes since previsit or office visit. 

## 2023-07-24 NOTE — Progress Notes (Signed)
History and Physical:  This patient presents for endoscopic testing for: Encounter Diagnosis  Name Primary?   History of colonic polyps Yes    Surveillance colonoscopy today 8mm cecal SSP on April 2021 colonoscopy done for positive cologuard Patient denies chronic abdominal pain, rectal bleeding, constipation or diarrhea.   Patient is otherwise without complaints or active issues today.   Past Medical History: Past Medical History:  Diagnosis Date   Diabetes mellitus    Diabetes mellitus without complication (HCC)    Phreesia 09/09/2020   Heart murmur    Hypertension    Kidney stones      Past Surgical History: Past Surgical History:  Procedure Laterality Date   COLONOSCOPY     LITHOTRIPSY     3 yrs ago    Allergies: No Known Allergies  Outpatient Meds: Current Outpatient Medications  Medication Sig Dispense Refill   acetaminophen (TYLENOL) 500 MG tablet Take 1,000 mg by mouth every 6 (six) hours as needed. PAIN     amLODipine (NORVASC) 5 MG tablet take one tablet daily in the morning. 90 tablet 3   ascorbic acid (VITAMIN C) 500 MG tablet Take 500 mg by mouth daily.     aspirin EC 81 MG tablet Take 81 mg by mouth daily.     dapagliflozin propanediol (FARXIGA) 10 MG TABS tablet Take 1 tablet (10 mg total) by mouth daily before breakfast. 90 tablet 3   DM-APAP-CPM (CORICIDIN HBP PO) Take by mouth as needed.     glipiZIDE (GLUCOTROL) 5 MG tablet take 1 tablet once a day before breakfast. follow-up appt due in june must see md refills 90 tablet 0   lisinopril (ZESTRIL) 20 MG tablet TAKE 1 TABLET ONCE DAILY. 90 tablet 3   metFORMIN (GLUCOPHAGE-XR) 500 MG 24 hr tablet Take 1 tablet (500 mg total) by mouth daily. 90 tablet 3   rosuvastatin (CRESTOR) 5 MG tablet TAKE 1 TABLET ONCE DAILY. 90 tablet 3   tadalafil (CIALIS) 20 MG tablet Take 1 tablet (20 mg total) by mouth daily as needed for erectile dysfunction. 10 tablet 5   diclofenac sodium (VOLTAREN) 1 % GEL Apply 2 g  topically 2 (two) times daily. (Patient taking differently: Apply 2 g topically daily as needed.) 100 g 0   Multiple Vitamins-Minerals (CENTRUM SILVER PO) Take 1 tablet by mouth daily.     Current Facility-Administered Medications  Medication Dose Route Frequency Provider Last Rate Last Admin   0.9 %  sodium chloride infusion  500 mL Intravenous Once Danis, Starr Lake III, MD          ___________________________________________________________________ Objective   Exam:  BP (!) 148/90   Pulse (!) 102   Temp 98 F (36.7 C) (Temporal)   Ht 5\' 9"  (1.753 m)   Wt 188 lb (85.3 kg)   SpO2 99%   BMI 27.76 kg/m   CV: regular , S1/S2 Resp: clear to auscultation bilaterally, normal RR and effort noted GI: soft, no tenderness, with active bowel sounds.   Assessment: Encounter Diagnosis  Name Primary?   History of colonic polyps Yes     Plan: Colonoscopy   The benefits and risks of the planned procedure were described in detail with the patient or (when appropriate) their health care proxy.  Risks were outlined as including, but not limited to, bleeding, infection, perforation, adverse medication reaction leading to cardiac or pulmonary decompensation, pancreatitis (if ERCP).  The limitation of incomplete mucosal visualization was also discussed.  No guarantees or warranties were  given.  The patient is appropriate for an endoscopic procedure in the ambulatory setting.   - Amada Jupiter, MD

## 2023-07-24 NOTE — Progress Notes (Signed)
Called to room to assist during endoscopic procedure.  Patient ID and intended procedure confirmed with present staff. Received instructions for my participation in the procedure from the performing physician.  

## 2023-07-25 ENCOUNTER — Telehealth: Payer: Self-pay

## 2023-07-25 NOTE — Telephone Encounter (Signed)
  Follow up Call-     07/24/2023    8:08 AM  Call back number  Post procedure Call Back phone  # [phone]  Permission to leave phone message Yes     Patient questions:  Do you have a fever, pain , or abdominal swelling? No. Pain Score  0 *  Have you tolerated food without any problems? Yes.    Have you been able to return to your normal activities? Yes.    Do you have any questions about your discharge instructions: Diet   No. Medications  No. Follow up visit  No.  Do you have questions or concerns about your Care? No.  Actions: * If pain score is 4 or above: No action needed, pain <4.

## 2023-07-26 LAB — SURGICAL PATHOLOGY

## 2023-08-01 ENCOUNTER — Encounter: Payer: Self-pay | Admitting: Gastroenterology

## 2023-08-02 ENCOUNTER — Ambulatory Visit (INDEPENDENT_AMBULATORY_CARE_PROVIDER_SITE_OTHER): Payer: No Typology Code available for payment source | Admitting: Otolaryngology

## 2023-08-02 ENCOUNTER — Encounter (INDEPENDENT_AMBULATORY_CARE_PROVIDER_SITE_OTHER): Payer: Self-pay

## 2023-08-02 ENCOUNTER — Encounter (INDEPENDENT_AMBULATORY_CARE_PROVIDER_SITE_OTHER): Payer: Self-pay | Admitting: Otolaryngology

## 2023-08-02 VITALS — Ht 69.0 in | Wt 188.0 lb

## 2023-08-02 DIAGNOSIS — K1379 Other lesions of oral mucosa: Secondary | ICD-10-CM | POA: Diagnosis not present

## 2023-08-02 DIAGNOSIS — R490 Dysphonia: Secondary | ICD-10-CM | POA: Diagnosis not present

## 2023-08-02 DIAGNOSIS — J383 Other diseases of vocal cords: Secondary | ICD-10-CM | POA: Diagnosis not present

## 2023-08-02 NOTE — Progress Notes (Signed)
Dear Dr. Alvy Bimler, Here is my assessment for our mutual patient, Jacob Chan. Thank you for allowing me the opportunity to care for your patient. Please do not hesitate to contact me should you have any other questions. Sincerely, Dr. Jovita Kussmaul  Otolaryngology Clinic Note Referring provider: Dr. Alvy Bimler HPI:  Jacob Chan is a 67 y.o. male kindly referred by Dr. Alvy Bimler for evaluation of dysphonia.  Initial visit (07/2023): Patient reports: throat just generally feels sore, and constant post nasal drip (mucoid). In the morning, it is the worst because his throat seems very dry. Once cleared and drinks water, the voice is much better but he does find his voice is "raspy" most of the day. Never loses his voice completely, and sometimes the voice is completely normal. Voice is not worse with use. This has been going on for "years and years." No antecedent event such as a URI or trauma or intubation. Thinks that drinking water or coffee helps his voice, and he reports that having his throat feel dry makes it worse. He has tried OTC meds (Lozenges, chlorseptic spray) and it all seems to help. He saw an ENT several years and did voice therapy, which did seem to help a lot. Water: 80 oz a day Coffee: 1 cup per day He is a reverend Patient otherwise denies: - dysphagia, odynophagia, aspiration episodes or PNA, need for Heimlich, unintentional weight loss - shortness of breath, hemoptysis, tobacco or significant alcohol history - neck masses  No significant reflux sx  H&N Surgery: no Personal or FHx of bleeding dz or anesthesia difficulty: no   AP/AC: ASA 81  Tobacco: never. Alcohol: None Occupation: Retired Education officer, environmental. Lives in [place].  PMHx: HTN, T2DM, CKD, HLD  Independent Review of Additional Tests or Records:  Referral notes (Dr. Alvy Bimler 06/23/2023 and 01/2023) reviewed and summarized - hoarse voice, dry throat in morning; Referred to ENT No prior  CT PMH/Meds/All/SocHx/FamHx/ROS:   Past Medical History:  Diagnosis Date   Diabetes mellitus    Diabetes mellitus without complication (HCC)    Phreesia 09/09/2020   Heart murmur    Hypertension    Kidney stones      Past Surgical History:  Procedure Laterality Date   COLONOSCOPY     LITHOTRIPSY     3 yrs ago    Family History  Problem Relation Age of Onset   Diabetes Mother    Heart disease Mother    Diabetes Father    Colon cancer Neg Hx    Esophageal cancer Neg Hx    Stomach cancer Neg Hx    Rectal cancer Neg Hx      Social Connections: Socially Integrated (04/21/2023)   Social Connection and Isolation Panel [NHANES]    Frequency of Communication with Friends and Family: Twice a week    Frequency of Social Gatherings with Friends and Family: Three times a week    Attends Religious Services: More than 4 times per year    Active Member of Clubs or Organizations: Yes    Attends Banker Meetings: More than 4 times per year    Marital Status: Married      Current Outpatient Medications:    acetaminophen (TYLENOL) 500 MG tablet, Take 1,000 mg by mouth every 6 (six) hours as needed. PAIN, Disp: , Rfl:    amLODipine (NORVASC) 5 MG tablet, take one tablet daily in the morning., Disp: 90 tablet, Rfl: 3   ascorbic acid (VITAMIN C) 500 MG tablet, Take 500 mg by mouth  daily., Disp: , Rfl:    aspirin EC 81 MG tablet, Take 81 mg by mouth daily., Disp: , Rfl:    dapagliflozin propanediol (FARXIGA) 10 MG TABS tablet, Take 1 tablet (10 mg total) by mouth daily before breakfast., Disp: 90 tablet, Rfl: 3   diclofenac sodium (VOLTAREN) 1 % GEL, Apply 2 g topically 2 (two) times daily. (Patient taking differently: Apply 2 g topically daily as needed.), Disp: 100 g, Rfl: 0   glipiZIDE (GLUCOTROL) 5 MG tablet, take 1 tablet once a day before breakfast. follow-up appt due in june must see md refills, Disp: 90 tablet, Rfl: 0   lisinopril (ZESTRIL) 20 MG tablet, TAKE 1 TABLET  ONCE DAILY., Disp: 90 tablet, Rfl: 3   metFORMIN (GLUCOPHAGE-XR) 500 MG 24 hr tablet, Take 1 tablet (500 mg total) by mouth daily., Disp: 90 tablet, Rfl: 3   Multiple Vitamins-Minerals (CENTRUM SILVER PO), Take 1 tablet by mouth daily., Disp: , Rfl:    rosuvastatin (CRESTOR) 5 MG tablet, TAKE 1 TABLET ONCE DAILY., Disp: 90 tablet, Rfl: 3   tadalafil (CIALIS) 20 MG tablet, Take 1 tablet (20 mg total) by mouth daily as needed for erectile dysfunction., Disp: 10 tablet, Rfl: 5   DM-APAP-CPM (CORICIDIN HBP PO), Take by mouth as needed. (Patient not taking: Reported on 08/02/2023), Disp: , Rfl:    Physical Exam:   Ht 5\' 9"  (1.753 m)   Wt 188 lb (85.3 kg)   BMI 27.76 kg/m   Salient findings:  CN II-XII intact  Bilateral EAC clear and TM intact with well pneumatized middle ear spaces Anterior rhinoscopy: Septum with mild deviation; bilateral inferior turbinates without significant hypertrophy No lesions of oral cavity/oropharynx No obviously palpable neck masses/lymphadenopathy/thyromegaly No respiratory distress or stridor; voice quality class ~2.5  Seprately Identifiable Procedures:  Procedure Note Pre-procedure diagnosis:  Dysphonia Post-procedure diagnosis: Same Procedure: Transnasal Fiberoptic Laryngoscopy, CPT 16109 - Mod 25 Indication: dysphonia Complications: None apparent EBL: 0 mL Date: 08/13/23   The procedure was undertaken to further evaluate the patient's complaint of dysphonia, with mirror exam inadequate for appropriate examination due to gag reflex and poor patient tolerance  Procedure:  Patient was identified as correct patient. Verbal consent was obtained. The nose was sprayed with oxymetazoline and 4% lidocaine. The The flexible laryngoscope was passed through the nose to view the nasal cavity, pharynx (oropharynx, hypopharynx) and larynx.  The larynx was examined at rest and during multiple phonatory tasks. Documentation was obtained and reviewed with patient. The scope  was removed. The patient tolerated the procedure well.  Findings: The nasal cavity and nasopharynx did not reveal any masses or lesions, mucosa appeared to be without obvious lesions. The tongue base, pharyngeal walls, piriform sinuses, vallecula, epiglottis and postcricoid region are normal in appearance without significant retained pyriform secretions or post-cricoid edema. The visualized portion of the subglottis and proximal trachea is widely patent. The vocal folds are mobile bilaterally. Right mid-vocal fold lesion - subepi cyst?; don't note any obvious contralateral reactive lesions; subglottis patent; does not seem worrisome for malignancy; no strobe in clinic so unable to eval mucosal wave but likely reduced; slight atrophy L VF    Electronically signed by: Read Drivers, MD 08/13/2023 12:36 PM   Impression & Plans:  Auner Dettling is a 67 y.o. male with:  1. Lesion of vocal fold   2. Dysphonia    Noted on TFL; does appear benign; we discussed options, and I do think excision is best option rather than therapy, which  is unlikely to resolve this.  I'm happy to manage this, but given we have a laryngologist in our practice (Dr. Irene Pap), will refer to her.  -f/u with me as needed  See below regarding exact medications prescribed this encounter including dosages and route: No orders of the defined types were placed in this encounter.     Thank you for allowing me the opportunity to care for your patient. Please do not hesitate to contact me should you have any other questions.  Sincerely, Jovita Kussmaul, MD Otolarynoglogist (ENT), [facility] Phone: [phone] Fax: [phone]  08/13/2023, 12:36 PM   I have personally spent 47 minutes involved in face-to-face and non-face-to-face activities for this patient on the day of the visit.  Professional time spent excludes any procedures performed but includes the following activities, in addition to those  noted in the documentation: preparing to see the patient (review of outside documentation and results), performing a medically appropriate examination and/or evaluation, counseling and educating the patient/family/caregiver, ordering medications, referring and communicating with other healthcare professionals, documenting clinical information in the electronic or other health record, independently interpreting results and communicating results with the patient/family/caregiver.

## 2023-08-08 DIAGNOSIS — H524 Presbyopia: Secondary | ICD-10-CM | POA: Diagnosis not present

## 2023-08-08 DIAGNOSIS — Z0100 Encounter for examination of eyes and vision without abnormal findings: Secondary | ICD-10-CM | POA: Diagnosis not present

## 2023-08-16 ENCOUNTER — Ambulatory Visit (INDEPENDENT_AMBULATORY_CARE_PROVIDER_SITE_OTHER): Payer: No Typology Code available for payment source | Admitting: Otolaryngology

## 2023-08-16 ENCOUNTER — Encounter (INDEPENDENT_AMBULATORY_CARE_PROVIDER_SITE_OTHER): Payer: Self-pay | Admitting: Otolaryngology

## 2023-08-16 ENCOUNTER — Encounter: Payer: Self-pay | Admitting: Emergency Medicine

## 2023-08-16 ENCOUNTER — Ambulatory Visit: Payer: No Typology Code available for payment source | Admitting: Emergency Medicine

## 2023-08-16 VITALS — BP 114/78 | HR 87 | Temp 98.3°F | Ht 69.5 in | Wt 182.0 lb

## 2023-08-16 VITALS — BP 132/87 | HR 91 | Ht 69.5 in | Wt 185.0 lb

## 2023-08-16 DIAGNOSIS — Z7984 Long term (current) use of oral hypoglycemic drugs: Secondary | ICD-10-CM

## 2023-08-16 DIAGNOSIS — E785 Hyperlipidemia, unspecified: Secondary | ICD-10-CM | POA: Diagnosis not present

## 2023-08-16 DIAGNOSIS — J383 Other diseases of vocal cords: Secondary | ICD-10-CM

## 2023-08-16 DIAGNOSIS — N1831 Chronic kidney disease, stage 3a: Secondary | ICD-10-CM | POA: Diagnosis not present

## 2023-08-16 DIAGNOSIS — K219 Gastro-esophageal reflux disease without esophagitis: Secondary | ICD-10-CM | POA: Diagnosis not present

## 2023-08-16 DIAGNOSIS — J342 Deviated nasal septum: Secondary | ICD-10-CM

## 2023-08-16 DIAGNOSIS — R361 Hematospermia: Secondary | ICD-10-CM | POA: Diagnosis not present

## 2023-08-16 DIAGNOSIS — R490 Dysphonia: Secondary | ICD-10-CM

## 2023-08-16 DIAGNOSIS — J3089 Other allergic rhinitis: Secondary | ICD-10-CM | POA: Diagnosis not present

## 2023-08-16 DIAGNOSIS — I152 Hypertension secondary to endocrine disorders: Secondary | ICD-10-CM

## 2023-08-16 DIAGNOSIS — E1159 Type 2 diabetes mellitus with other circulatory complications: Secondary | ICD-10-CM | POA: Diagnosis not present

## 2023-08-16 DIAGNOSIS — I350 Nonrheumatic aortic (valve) stenosis: Secondary | ICD-10-CM

## 2023-08-16 DIAGNOSIS — R0981 Nasal congestion: Secondary | ICD-10-CM

## 2023-08-16 DIAGNOSIS — E1169 Type 2 diabetes mellitus with other specified complication: Secondary | ICD-10-CM | POA: Diagnosis not present

## 2023-08-16 DIAGNOSIS — J381 Polyp of vocal cord and larynx: Secondary | ICD-10-CM | POA: Diagnosis not present

## 2023-08-16 DIAGNOSIS — R0982 Postnasal drip: Secondary | ICD-10-CM

## 2023-08-16 LAB — POCT GLYCOSYLATED HEMOGLOBIN (HGB A1C): HbA1c POC (<> result, manual entry): 7.1 % (ref 4.0–5.6)

## 2023-08-16 MED ORDER — FLUTICASONE PROPIONATE 50 MCG/ACT NA SUSP: 2.0000 | Freq: Every day | NASAL | 6 refills | Status: AC

## 2023-08-16 MED ORDER — FAMOTIDINE 20 MG PO TABS: 20.0000 mg | ORAL_TABLET | Freq: Two times a day (BID) | ORAL | 1 refills | Status: DC

## 2023-08-16 NOTE — Assessment & Plan Note (Signed)
Advised to stay well-hydrated and avoid NSAIDs Will benefit from starting Farxiga 10 mg daily

## 2023-08-16 NOTE — Progress Notes (Signed)
ENT Progress Note:  Reason for Consult: vocal fold lesion and dysphonia for years  HPI: Discussed the use of AI scribe software for clinical note transcription with the patient, who gave verbal consent to proceed.  History of Present Illness   The patient is a 67 yoM, hx of DM and kidney stones, CKD, with a long-standing history of voice changes, presents with a chief complaint of persistent hoarseness. The voice changes began in the early 1990s, and the patient reports that the voice has remained raspy since then, initially sx were intermittent, but lately over the course of the last 2-3 years became constant. The patient reports no pain with talking or swallowing. No dysphagia or dyspnea.   The patient also reports shortness of breath and easy fatigability, but denies any swallowing difficulties. He has noticed an increase in belching and hiccupping after meals, which could potentially indicate gastroesophageal reflux. However, the patient has not been on any medication for reflux or heartburn for a long time.  The patient also reports postnasal drainage and a feeling of congestion, but is not currently using any medication for this. The patient has no history of intubation or surgery on the vocal cords. The only surgical history is a procedure for kidney stones over ten years ago.  The patient has a history of smoking a pipe for a short duration in his late teens or early twenties, but has not smoked since. The patient denies any known autoimmune conditions.  The patient's voice changes have affected his ability to participate in singing and public speaking, which he used to do regularly. The patient reports that voice rest seems to improve the quality of his voice.  The patient has not had any other significant medical events such as a stroke or heart attack, and reports no known lung conditions.       Records Reviewed:  Office visit with Dr Tomi Likens is a 67 y.o. male  kindly referred by Dr. Alvy Bimler for evaluation of dysphonia.  Initial visit (07/2023): Patient reports: throat just generally feels sore, and constant post nasal drip (mucoid). In the morning, it is the worst because his throat seems very dry. Once cleared and drinks water, the voice is much better but he does find his voice is "raspy" most of the day. Never loses his voice completely, and sometimes the voice is completely normal. Voice is not worse with use. This has been going on for "years and years." No antecedent event such as a URI or trauma or intubation. Thinks that drinking water or coffee helps his voice, and he reports that having his throat feel dry makes it worse. He has tried OTC meds (Lozenges, chlorseptic spray) and it all seems to help. He saw an ENT several years and did voice therapy, which did seem to help a lot. Water: 80 oz a day Coffee: 1 cup per day    Past Medical History:  Diagnosis Date   Diabetes mellitus    Diabetes mellitus without complication (HCC)    Phreesia 09/09/2020   Heart murmur    Hypertension    Kidney stones     Past Surgical History:  Procedure Laterality Date   COLONOSCOPY     LITHOTRIPSY     3 yrs ago    Family History  Problem Relation Age of Onset   Diabetes Mother    Heart disease Mother    Diabetes Father    Colon cancer Neg Hx    Esophageal cancer Neg  Hx    Stomach cancer Neg Hx    Rectal cancer Neg Hx     Social History:  reports that he has never smoked. He has never used smokeless tobacco. He reports that he does not drink alcohol and does not use drugs.  Allergies: No Known Allergies  Medications: I have reviewed the patient's current medications.  The PMH, PSH, Medications, Allergies, and SH were reviewed and updated.  ROS: Constitutional: Negative for fever, weight loss and weight gain. Cardiovascular: Negative for chest pain and dyspnea on exertion. Respiratory: Is not experiencing shortness of breath at  rest. Gastrointestinal: Negative for nausea and vomiting. Neurological: Negative for headaches. Psychiatric: The patient is not nervous/anxious  Blood pressure 132/87, pulse 91, height 5' 9.5" (1.765 m), weight 185 lb (83.9 kg), SpO2 99%.  PHYSICAL EXAM:  Exam: General: Well-developed, well-nourished Communication and Voice: raspy Respiratory Respiratory effort: Equal inspiration and expiration without stridor Cardiovascular Peripheral Vascular: Warm extremities with equal color/perfusion Eyes: No nystagmus with equal extraocular motion bilaterally Neuro/Psych/Balance: Patient oriented to person, place, and time; Appropriate mood and affect; Gait is intact with no imbalance; Cranial nerves I-XII are intact Head and Face Inspection: Normocephalic and atraumatic without mass or lesion Palpation: Facial skeleton intact without bony stepoffs Salivary Glands: No mass or tenderness Facial Strength: Facial motility symmetric and full bilaterally ENT Pinna: External ear intact and fully developed External canal: Canal is patent with intact skin Tympanic Membrane: Clear and mobile External Nose: No scar or anatomic deformity Internal Nose: Septum is deviated to the left. No polyp, or purulence. Mucosal edema and erythema present.  Bilateral inferior turbinate hypertrophy.  Lips, Teeth, and gums: Mucosa and teeth intact and viable TMJ: No pain to palpation with full mobility Oral cavity/oropharynx: No erythema or exudate, no lesions present Nasopharynx: No mass or lesion with intact mucosa Hypopharynx: Intact mucosa without pooling of secretions Larynx Glottic: Full true vocal cord mobility with a cystic mass along right true VF (nearly entire length), VF atrophy bilaterally, and incomplete glottic closure, mucosal wave decreased on the right 2/2 cyst Supraglottic: Normal appearing epiglottis and AE folds Interarytenoid Space: Moderate pachydermia&edema Subglottic Space: Patent without  lesion or edema Neck Neck and Trachea: Midline trachea without mass or lesion Thyroid: No mass or nodularity Lymphatics: No lymphadenopathy  Procedure:  Preoperative diagnosis: hoarseness and R VF mass/lesion  Postoperative diagnosis:   same + GERD LPR + VF atrophy and glottic insufficiency  Procedure: Flexible fiberoptic laryngoscopy with stroboscopy (02725)  Surgeon: Ashok Croon, MD  Anesthesia: Topical lidocaine and Afrin  Complications: None  Condition is stable throughout exam  Indications and consent:   The patient presents to the clinic with hoarseness. All the risks, benefits, and potential complications were reviewed with the patient preoperatively and informed verbal consent was obtained.  Procedure: The patient was seated upright in the exam chair.   Topical lidocaine and Afrin were applied to the nasal cavity. After adequate anesthesia had occurred, the flexible telescope was passed into the nasal cavity. The nasopharynx was patent without mass or lesion. The scope was passed behind the soft palate and directed toward the base of tongue. The base of tongue was visualized and was symmetric with no apparent masses or abnormal appearing tissue. There were no signs of a mass or pooling of secretions in the piriform sinuses. The supraglottic structures were normal.  The true vocal cords are mobile and atrophic. The medial edges were bowed and with lesion along right true VF. Closure was incomplete. Periodicity  present. The mucosal wave and amplitude were decreased on the right side 2/2 cystic mass. There is moderate interarytenoid pachydermia and post cricoid edema.   The laryngoscope was then slowly withdrawn and the patient tolerated the procedure well. There were no complications or blood loss.  Studies Reviewed:09/14/20 CLINICAL DATA:  Cough and shortness of breath   EXAM: CHEST - 2 VIEW   COMPARISON:  October 20, 2009   FINDINGS: Lungs are clear. Heart size and  pulmonary vascularity are normal. No adenopathy. There is slight anterior wedging of a midthoracic vertebral body.   IMPRESSION: Lungs clear.  Cardiac silhouette normal.    Assessment/Plan: Encounter Diagnoses  Name Primary?   Vocal fold polyp Yes   Dysphonia    Age-related vocal fold atrophy    Glottic insufficiency    Chronic GERD    Environmental and seasonal allergies    Nasal congestion    Post-nasal drip     Assessment and Plan    R Vocal Cord Cyst/lesion and hx of gradually worsening chronic dysphonia/raspy voice Chronic hoarseness and voice changes since the early 1990s. Examination revealed a sizable cyst on the right vocal cord, likely benign but causing significant disruption in vocal fold closure and vibration based on strobe exam, and had evidence of VF atrophy on exam as well with findings c/w GERD LPR and post-nasal drainage. Discussed surgical removal and vocal fold injection augmentation to improve closure and reduce strain. Explained procedure involves general anesthesia, intubation, and no external incisions. Anticipated outcome is improved voice quality.  - risks and benefits of surgery discussed and he would like to proceed - Schedule for DML, CO2 laser excision of right VF polyp and b/l VF injection augmentation - Prescribe Pepcid (famotidine) 20 mg BID for reflux management - Prescribe Flonase 2 puffs b/l nares BID  for nasal drainage - Advise complete voice rest for 2-3 days post-surgery, followed by gradual increase in voice use with glides - Coordinate with surgery schedulers for a Friday appointment  Chronic Nasal Congestion Chronic postnasal drainage and stuffy nose. Examination revealed nasal congestion and a deviated septum. - Prescribe Flonase for nasal drainage - will consider systemic antihistamine post-op if warranted and no improvement in the future   GERD LPR Belching and hiccups after eating, and findings on scope exam today c/w GERD LPR. No  recent history of medication for reflux. - Prescribe Pepcid (famotidine) 20 mg BID for reflux management  General Health Maintenance No history of stroke, myocardial infarction, or significant pulmonary issues. - Advised to inform primary care physician about upcoming surgery and ensure surgical clearance if needed  Follow-up - Provide an excuse note for today's visit - Coordinate with surgery schedulers for a Friday appointment - Advise to follow up with primary care physician for surgical clearance.     Thank you for allowing me to participate in the care of this patient. Please do not hesitate to contact me with any questions or concerns.   Ashok Croon, MD Otolaryngology [facility] Phone: [phone] Fax: [phone]    08/16/2023, 8:45 PM

## 2023-08-16 NOTE — Progress Notes (Signed)
Jacob Chan 67 y.o.   Chief Complaint  Patient presents with   Medical Management of Chronic Issues    6 month f/u pt would like to discuss male concerns as well     HISTORY OF PRESENT ILLNESS: This is a 67 y.o. here for follow-up of diabetes and hypertension Recently also noted blood in the semen ED medications not helping much No other complaints or medical concerns today Lab Results  Component Value Date   HGBA1C 7.1 08/16/2023   Wt Readings from Last 3 Encounters:  08/16/23 182 lb (82.6 kg)  08/16/23 185 lb (83.9 kg)  08/02/23 188 lb (85.3 kg)     HPI   Prior to Admission medications   Medication Sig Start Date End Date Taking? Authorizing Provider  acetaminophen (TYLENOL) 500 MG tablet Take 1,000 mg by mouth every 6 (six) hours as needed. PAIN   Yes [provider]  amLODipine (NORVASC) 5 MG tablet take one tablet daily in the morning. 12/20/22  Yes Albaraa Swingle, Eilleen Kempf, MD  ascorbic acid (VITAMIN C) 500 MG tablet Take 500 mg by mouth daily.   Yes [provider]  aspirin EC 81 MG tablet Take 81 mg by mouth daily.   Yes [provider]  dapagliflozin propanediol (FARXIGA) 10 MG TABS tablet Take 1 tablet (10 mg total) by mouth daily before breakfast. 02/20/23  Yes Majel Giel, Eilleen Kempf, MD  diclofenac sodium (VOLTAREN) 1 % GEL Apply 2 g topically 2 (two) times daily. Patient taking differently: Apply 2 g topically daily as needed. 02/18/19  Yes Linley Moxley, Eilleen Kempf, MD  DM-APAP-CPM (CORICIDIN HBP PO) Take by mouth as needed.   Yes [provider]  famotidine (PEPCID) 20 MG tablet Take 1 tablet (20 mg total) by mouth 2 (two) times daily. 08/16/23  Yes Ashok Croon, MD  fluticasone (FLONASE) 50 MCG/ACT nasal spray Place 2 sprays into both nostrils daily. 08/16/23  Yes Ashok Croon, MD  glipiZIDE (GLUCOTROL) 5 MG tablet take 1 tablet once a day before breakfast. follow-up appt due in june must see md refills 04/21/23  Yes  Sennie Borden, Eilleen Kempf, MD  lisinopril (ZESTRIL) 20 MG tablet TAKE 1 TABLET ONCE DAILY. 12/20/22  Yes Macedonio Scallon, Eilleen Kempf, MD  metFORMIN (GLUCOPHAGE-XR) 500 MG 24 hr tablet Take 1 tablet (500 mg total) by mouth daily. 04/21/23 04/15/24 Yes Lunah Losasso, Eilleen Kempf, MD  Multiple Vitamins-Minerals (CENTRUM SILVER PO) Take 1 tablet by mouth daily.   Yes [provider]  rosuvastatin (CRESTOR) 5 MG tablet TAKE 1 TABLET ONCE DAILY. 12/20/22  Yes Cybele Maule, Eilleen Kempf, MD  tadalafil (CIALIS) 20 MG tablet Take 1 tablet (20 mg total) by mouth daily as needed for erectile dysfunction. 02/20/23  Yes [name], MD    No Known Allergies  Patient Active Problem List   Diagnosis Date Noted   Stage 3a chronic kidney disease (HCC) 02/20/2023   Diverticulosis 02/15/2021   Chronic hoarseness 02/15/2021   Nonrheumatic aortic valve stenosis 02/15/2021   Hypertension associated with diabetes (HCC) 12/20/2016   Dyslipidemia associated with type 2 diabetes mellitus (HCC) 12/20/2016    Past Medical History:  Diagnosis Date   Diabetes mellitus    Diabetes mellitus without complication (HCC)    [employer]rees[employer] 09/09/2020   Heart murmur    Hypertension    Kidney stones     Past Surgical History:  Procedure Laterality Date   COLONOSCOPY     LITHOTRIPSY     3 yrs ago    Social History  Socioeconomic History   Marital status: Married    Spouse name: [name]   Number of children: 4   Years of education: Not on file   Highest education level: Not on file  Occupational History   Not on file  Tobacco Use   Smoking status: Never   Smokeless tobacco: Never  Vaping Use   Vaping status: Never Used  Substance and Sexual Activity   Alcohol use: No   Drug use: No   Sexual activity: Yes  Other Topics Concern   Not on file  Social History Narrative   Not on file   Social Drivers of Health   Financial Resource Strain: Low Risk  (04/21/2023)   Overall Financial Resource Strain (CARDIA)     Difficulty of Paying Living Expenses: Not hard at all  Food Insecurity: No Food Insecurity (04/21/2023)   Hunger Vital Sign    Worried About Running Out of Food in the Last Year: Never true    Ran Out of Food in the Last Year: Never true  Transportation Needs: No Transportation Needs (04/21/2023)   PRAPARE - Administrator, Civil Service (Medical): No    Lack of Transportation (Non-Medical): No  Physical Activity: Sufficiently Active (04/21/2023)   Exercise Vital Sign    Days of Exercise per Week: 5 days    Minutes of Exercise per Session: 40 min  Stress: Stress Concern Present (04/21/2023)   [employer] of Occupational Health - Occupational Stress Questionnaire    Feeling of Stress : To some extent  Social Connections: Socially Integrated (04/21/2023)   Social Connection and Isolation Panel [NHANES]    Frequency of Communication with Friends and Family: Twice a week    Frequency of Social Gatherings with Friends and Family: Three times a week    Attends Religious Services: More than 4 times per year    Active Member of Clubs or Organizations: Yes    Attends Banker Meetings: More than 4 times per year    Marital Status: Married  Catering manager Violence: Not At Risk (04/21/2023)   Humiliation, Afraid, Rape, and Kick questionnaire    Fear of Current or Ex-Partner: No    Emotionally Abused: No    Physically Abused: No    Sexually Abused: No    Family History  Problem Relation Age of Onset   Diabetes Mother    Heart disease Mother    Diabetes Father    Colon cancer Neg Hx    Esophageal cancer Neg Hx    Stomach cancer Neg Hx    Rectal cancer Neg Hx      Review of Systems  Constitutional: Negative.  Negative for chills and fever.  HENT: Negative.  Negative for congestion and sore throat.   Respiratory: Negative.  Negative for cough and shortness of breath.   Cardiovascular: Negative.  Negative for chest pain and palpitations.  Gastrointestinal:  Negative.  Negative for abdominal pain, diarrhea, nausea and vomiting.  Genitourinary: Negative.  Negative for dysuria and hematuria.  Skin: Negative.  Negative for rash.  Neurological: Negative.  Negative for dizziness and headaches.  All other systems reviewed and are negative.   Vitals:   08/16/23 1530  BP: 114/78  Pulse: 87  Temp: 98.3 F (36.8 C)  SpO2: 98%    Physical Exam Vitals reviewed.  Constitutional:      Appearance: Normal appearance.  HENT:     Head: Normocephalic.     Mouth/Throat:     Mouth: Mucous membranes  are moist.     Pharynx: Oropharynx is clear.  Eyes:     Extraocular Movements: Extraocular movements intact.     Pupils: Pupils are equal, round, and reactive to light.  Cardiovascular:     Rate and Rhythm: Normal rate and regular rhythm.     Heart sounds: Murmur heard.  Pulmonary:     Effort: Pulmonary effort is normal.     Breath sounds: Normal breath sounds.  Abdominal:     Palpations: Abdomen is soft.     Tenderness: There is no abdominal tenderness.  Musculoskeletal:     Cervical back: No tenderness.  Lymphadenopathy:     Cervical: No cervical adenopathy.  Skin:    General: Skin is warm and dry.     Capillary Refill: Capillary refill takes less than 2 seconds.  Neurological:     General: No focal deficit present.     Mental Status: He is alert and oriented to person, place, and time.  Psychiatric:        Mood and Affect: Mood normal.        Behavior: Behavior normal.     Results for orders placed or performed in visit on 08/16/23 (from the past 24 hours)  POCT HgB A1C     Status: Abnormal   Collection Time: 08/16/23  3:41 PM  Result Value Ref Range   Hemoglobin A1C     HbA1c POC (<> result, manual entry) 7.1 4.0 - 5.6 %   HbA1c, POC (prediabetic range)     HbA1c, POC (controlled diabetic range)      ASSESSMENT & PLAN: A total of 43 minutes was spent with the patient and counseling/coordination of care regarding preparing for  this visit, review of most recent office visit notes, review of multiple chronic medical conditions and their management, cardiovascular risks associated with diabetes and hypertension, review of all medications, review of most recent bloodwork results including interpretation of today's hemoglobin A1c, review of health maintenance items, education on nutrition, prognosis, documentation, and need for follow up. .  Problem List Items Addressed This Visit       Cardiovascular and Mediastinum   Hypertension associated with diabetes (HCC) - Primary   Well-controlled hypertension Recommend to continue amlodipine 5 mg daily and lisinopril 20 mg daily Hemoglobin A1c better than before at 7.1 Cardiovascular risks associated with uncontrolled diabetes discussed Diet and nutrition discussed Continue glipizide 5 mg daily and metformin 500 mg daily Continue Farxiga 10 mg daily Follow-up in 6 months      Relevant Orders   POCT HgB A1C (Completed)   Urine Microalbumin w/creat. ratio   Comprehensive metabolic panel   Lipid panel   Nonrheumatic aortic valve stenosis   Stable. Asymptomatic.         Endocrine   Dyslipidemia associated with type 2 diabetes mellitus (HCC)   Chronic stable condition Continue rosuvastatin 5 mg daily Diet and nutrition discussed The 10-year ASCVD risk score (Arnett DK, et al., 2019) is: 23.1%   Values used to calculate the score:     Age: 72 years     Sex: Male     Is Non-Hispanic African American: Yes     Diabetic: Yes     Tobacco smoker: No     Systolic Blood Pressure: 114 mmHg     Is BP treated: Yes     HDL Cholesterol: 52.7 mg/dL     Total Cholesterol: 138 mg/dL       Relevant Orders   Urine Microalbumin w/creat.  ratio   Comprehensive metabolic panel   Lipid panel     Genitourinary   Stage 3a chronic kidney disease (HCC)   Advised to stay well-hydrated and avoid NSAIDs Will benefit from starting Farxiga 10 mg daily      Relevant Orders   Urine  Microalbumin w/creat. ratio   Comprehensive metabolic panel   Lipid panel   Other Visit Diagnoses       Hematospermia       Relevant Orders   Ambulatory referral to Urology   PSA   Urinalysis      Patient Instructions  Diabetes Mellitus and Nutrition, Adult When you have diabetes, or diabetes mellitus, it is very important to have healthy eating habits because your blood sugar (glucose) levels are greatly affected by what you eat and drink. Eating healthy foods in the right amounts, at about the same times every day, can help you: Manage your blood glucose. Lower your risk of heart disease. Improve your blood pressure. Reach or maintain a healthy weight. What can affect my meal plan? Every person with diabetes is different, and each person has different needs for a meal plan. Your health care provider may recommend that you work with a dietitian to make a meal plan that is best for you. Your meal plan may vary depending on factors such as: The calories you need. The medicines you take. Your weight. Your blood glucose, blood pressure, and cholesterol levels. Your activity level. Other health conditions you have, such as heart or kidney disease. How do carbohydrates affect me? Carbohydrates, also called carbs, affect your blood glucose level more than any other type of food. Eating carbs raises the amount of glucose in your blood. It is important to know how many carbs you can safely have in each meal. This is different for every person. Your dietitian can help you calculate how many carbs you should have at each meal and for each snack. How does alcohol affect me? Alcohol can cause a decrease in blood glucose (hypoglycemia), especially if you use insulin or take certain diabetes medicines by mouth. Hypoglycemia can be a life-threatening condition. Symptoms of hypoglycemia, such as sleepiness, dizziness, and confusion, are similar to symptoms of having too much alcohol. Do not drink  alcohol if: Your health care provider tells you not to drink. You are pregnant, may be pregnant, or are planning to become pregnant. If you drink alcohol: Limit how much you have to: 0-1 drink a day for women. 0-2 drinks a day for men. Know how much alcohol is in your drink. In the [place] one drink equals one 12 oz bottle of beer (355 mL), one 5 oz glass of wine (148 mL), or one 1 oz glass of hard liquor (44 mL). Keep yourself hydrated with water, diet soda, or unsweetened iced tea. Keep in mind that regular soda, juice, and other mixers may contain a lot of sugar and must be counted as carbs. What are tips for following this plan?  Reading food labels Start by checking the serving size on the Nutrition Facts label of packaged foods and drinks. The number of calories and the amount of carbs, fats, and other nutrients listed on the label are based on one serving of the item. Many items contain more than one serving per package. Check the total grams (g) of carbs in one serving. Check the number of grams of saturated fats and trans fats in one serving. Choose foods that have a low amount or none of  these fats. Check the number of milligrams (mg) of salt (sodium) in one serving. Most people should limit total sodium intake to less than 2,300 mg per day. Always check the nutrition information of foods labeled as "low-fat" or "nonfat." These foods may be higher in added sugar or refined carbs and should be avoided. Talk to your dietitian to identify your daily goals for nutrients listed on the label. Shopping Avoid buying canned, pre-made, or processed foods. These foods tend to be high in fat, sodium, and added sugar. Shop around the outside edge of the grocery store. This is where you will most often find fresh fruits and vegetables, bulk grains, fresh meats, and fresh dairy products. Cooking Use low-heat cooking methods, such as baking, instead of high-heat cooking methods, such as deep  frying. Cook using healthy oils, such as olive, canola, or sunflower oil. Avoid cooking with butter, cream, or high-fat meats. Meal planning Eat meals and snacks regularly, preferably at the same times every day. Avoid going long periods of time without eating. Eat foods that are high in fiber, such as fresh fruits, vegetables, beans, and whole grains. Eat 4-6 oz (112-168 g) of lean protein each day, such as lean meat, chicken, fish, eggs, or tofu. One ounce (oz) (28 g) of lean protein is equal to: 1 oz (28 g) of meat, chicken, or fish. 1 egg.  cup (62 g) of tofu. Eat some foods each day that contain healthy fats, such as avocado, nuts, seeds, and fish. What foods should I eat? Fruits Berries. Apples. Oranges. Peaches. Apricots. Plums. Grapes. Mangoes. Papayas. Pomegranates. Kiwi. Cherries. Vegetables Leafy greens, including lettuce, spinach, kale, chard, collard greens, mustard greens, and cabbage. Beets. Cauliflower. Broccoli. Carrots. Green beans. Tomatoes. Peppers. Onions. Cucumbers. Brussels sprouts. Grains Whole grains, such as whole-wheat or whole-grain bread, crackers, tortillas, cereal, and pasta. Unsweetened oatmeal. Quinoa. Brown or wild rice. Meats and other proteins Seafood. Poultry without skin. Lean cuts of poultry and beef. Tofu. Nuts. Seeds. Dairy Low-fat or fat-free dairy products such as milk, yogurt, and cheese. The items listed above may not be a complete list of foods and beverages you can eat and drink. Contact a dietitian for more information. What foods should I avoid? Fruits Fruits canned with syrup. Vegetables Canned vegetables. Frozen vegetables with butter or cream sauce. Grains Refined white flour and flour products such as bread, pasta, snack foods, and cereals. Avoid all processed foods. Meats and other proteins Fatty cuts of meat. Poultry with skin. Breaded or fried meats. Processed meat. Avoid saturated fats. Dairy Full-fat yogurt, cheese, or  milk. Beverages Sweetened drinks, such as soda or iced tea. The items listed above may not be a complete list of foods and beverages you should avoid. Contact a dietitian for more information. Questions to ask a health care provider Do I need to meet with a certified diabetes care and education specialist? Do I need to meet with a dietitian? What number can I call if I have questions? When are the best times to check my blood glucose? Where to find more information: American Diabetes Association: diabetes.org Academy of Nutrition and Dietetics: eatright.[employer] of Diabetes and Digestive and Kidney Diseases: StageSync.si Association of Diabetes Care & Education Specialists: diabeteseducator.org Summary It is important to have healthy eating habits because your blood sugar (glucose) levels are greatly affected by what you eat and drink. It is important to use alcohol carefully. A healthy meal plan will help you manage your blood glucose and lower your risk  of heart disease. Your health care provider may recommend that you work with a dietitian to make a meal plan that is best for you. This information is not intended to replace advice given to you by your health care provider. Make sure you discuss any questions you have with your health care provider. Document Revised: 03/18/2020 Document Reviewed: 03/18/2020 Elsevier Patient Education  2024 Elsevier Inc.     Edwina Barth, MD [facility]

## 2023-08-16 NOTE — Assessment & Plan Note (Signed)
Stable. Asymptomatic.

## 2023-08-16 NOTE — Patient Instructions (Signed)

## 2023-08-16 NOTE — Assessment & Plan Note (Signed)
Chronic stable condition Continue rosuvastatin 5 mg daily Diet and nutrition discussed The 10-year ASCVD risk score (Arnett DK, et al., 2019) is: 23.1%   Values used to calculate the score:     Age: 67 years     Sex: Male     Is Non-Hispanic African American: Yes     Diabetic: Yes     Tobacco smoker: No     Systolic Blood Pressure: 114 mmHg     Is BP treated: Yes     HDL Cholesterol: 52.7 mg/dL     Total Cholesterol: 138 mg/dL

## 2023-08-16 NOTE — Assessment & Plan Note (Signed)
Well-controlled hypertension Recommend to continue amlodipine 5 mg daily and lisinopril 20 mg daily Hemoglobin A1c better than before at 7.1 Cardiovascular risks associated with uncontrolled diabetes discussed Diet and nutrition discussed Continue glipizide 5 mg daily and metformin 500 mg daily Continue Farxiga 10 mg daily Follow-up in 6 months

## 2023-08-17 LAB — URINALYSIS
Bilirubin Urine: NEGATIVE
Hgb urine dipstick: NEGATIVE
Ketones, ur: 15 — AB
Leukocytes,Ua: NEGATIVE
Nitrite: NEGATIVE
Specific Gravity, Urine: 1.025 (ref 1.000–1.030)
Total Protein, Urine: NEGATIVE
Urine Glucose: >=1000 — AB
Urobilinogen, UA: 0.2 (ref 0.0–1.0)
pH: 5.5 (ref 5.0–8.0)

## 2023-08-17 LAB — PSA: PSA: 0.99 ng/mL (ref 0.10–4.00)

## 2023-08-28 ENCOUNTER — Other Ambulatory Visit: Payer: Self-pay | Admitting: Emergency Medicine

## 2023-08-28 DIAGNOSIS — N1831 Chronic kidney disease, stage 3a: Secondary | ICD-10-CM

## 2023-08-28 DIAGNOSIS — I152 Hypertension secondary to endocrine disorders: Secondary | ICD-10-CM

## 2023-09-27 ENCOUNTER — Encounter (HOSPITAL_COMMUNITY): Payer: Self-pay

## 2023-09-27 ENCOUNTER — Other Ambulatory Visit: Payer: Self-pay

## 2023-09-27 ENCOUNTER — Other Ambulatory Visit: Payer: Self-pay | Admitting: Emergency Medicine

## 2023-09-27 DIAGNOSIS — N1831 Chronic kidney disease, stage 3a: Secondary | ICD-10-CM

## 2023-09-27 DIAGNOSIS — I152 Hypertension secondary to endocrine disorders: Secondary | ICD-10-CM

## 2023-09-27 NOTE — Progress Notes (Signed)
SDW call  Patient was given pre-op instructions over the phone. Patient verbalized understanding of instructions provided.     PCP - Dr. Edwina Barth Cardiologist -  Pulmonary:    PPM/ICD - denies Device Orders - na Rep Notified - na   Chest x-ray - na EKG -  DOS, 09/29/2023 Stress Test - ECHO - 03/20/2020 Cardiac Cath -   Sleep Study/sleep apnea/CPAP: denies  Type II diabetic Fasting Blood sugar range: less than 120 How often check sugars: daily Farxiga, instructed to hold 72 hours, last dose 09/27/2023 Metformin, instructed to hold DOS Glipizide, instructed to hold DOS  Blood Thinner Instructions: denies Aspirin Instructions: states last dose 09/17/2023   ERAS Protcol - Clears until 1045   Anesthesia review: Yes.  HTN, DM, heart murmur   Patient denies shortness of breath, fever, cough and chest pain over the phone call  Your procedure is scheduled on Friday September 29, 2023  Report to [facility] Main Entrance "A" at 1115 A.M., then check in with the Admitting office.  Call this number if you have problems the morning of surgery:  [phone]   If you have any questions prior to your surgery date call [phone]: Open Monday-Friday 8am-4pm If you experience any cold or flu symptoms such as cough, fever, chills, shortness of breath, etc. between now and your scheduled surgery, please notify us at the above number    Remember:  Do not eat after midnight the night before your surgery  You may drink clear liquids until  1045   the morning of your surgery.   Clear liquids allowed are: Water, Non-Citrus Juices (without pulp), Carbonated Beverages, Clear Tea, Black Coffee ONLY (NO MILK, CREAM OR POWDERED CREAMER of any kind), and Gatorade   Take these medicines the morning of surgery with A SIP OF WATER:  Amlodipine, pepcid, flonase  As needed: tylenol  As of today, STOP taking any Aspirin (unless otherwise instructed by your surgeon) Aleve, Naproxen, Ibuprofen,  Motrin, Advil, Goody's, BC's, all herbal medications, fish oil, and all vitamins.  This includes your Voltaren gel

## 2023-09-28 ENCOUNTER — Encounter (HOSPITAL_COMMUNITY): Payer: Self-pay | Admitting: Vascular Surgery

## 2023-09-28 NOTE — Progress Notes (Signed)
Anesthesia Chart Review:  Case: [id number] Date/Time: 09/29/23 1330   Procedures:      MICROLARYNGOSCOPY WITH CO2 LASER AND EXCISION OF VOCAL CORD LESION (Bilateral)     RIGID BRONCHOSCOPY     MICROLARYNGOSCOPY WITH VOCAL CORD INJECTION   Anesthesia type: Choice   Pre-op diagnosis: VOCAL CORD POLYP, DYSPHONIA   Location: [facility] ROOM 09 / [facility]   Surgeons: Ashok Croon, MD       DISCUSSION: Patient is a 68 year old male scheduled for the above procedure. Procedure planned for chronic hoarseness/dysphonia. 07/2003 flexible fiberoptic laryngoscopy on showed true vocal cords mobile and atrophic, medial edges were bowed and with lesion along right true VF, closure was incomplete, periodicity present, mucosal wave and amplitude were decreased on the right side 2/2 cystic mass, there is moderate interarytenoid pachydermia and post cricoid edema.   Other history includes never smoker, HTN, DM2, CKD (3a), dyslipidemia, GERD, murmur (mild MR, mild-moderate AS 02/2020).   He had cardiology evaluation by Lennie Odor, MD on 02/28/20 for murmur evaluation. He reported low energy with poor sleep, but otherwise no symptoms concerning for angina. Echo ordered with plans for one year follow-up. 03/20/20 echo showed LVEF 60-65%, no regional wall motion abnormalities, grade 1 diastolic dysfunction, normal RV systolic function, normal PASP, RVSP 27.4 mmHg, mild MR, severe AV calcification with trivial  and mild-moderate AS (mean gradient 19 mmHg, peak gradient 33.4, AVA VIT 0.80 cm). He reported SOB and easily fatigability by ENT notes.  Mr. Holte has not had cardiology follow-up or repeat echo since July 2021. His PCP [name], MD noted nonrheumatic aortic valve stenosis was "Stable. Asymptomatic" at 08/16/23 6 month routine medical management visit.  A1c 7.1% at that time.  Reviewed history with anesthesiologist Arrie Aran, MD. Given mild-moderate AS > 3 years ago without cardiology or echo  follow-up, would advised preoperative cardiology evaluation.    VS: Ht 5\' 9"  (1.753 m)   Wt 83.9 kg   BMI 27.32 kg/m  BP Readings from Last 3 Encounters:  08/16/23 114/78  08/16/23 132/87  07/24/23 101/62   Pulse Readings from Last 3 Encounters:  08/16/23 87  08/16/23 91  07/24/23 82     PROVIDERS: [name], MD is PCP  Jovita Kussmaul, MD & Ashok Croon, MD are his ENT providers   LABS: Most recent lab results in [facility] include: Lab Results  Component Value Date   WBC 5.0 02/20/2023   HGB 13.5 02/20/2023   HCT 41.9 02/20/2023   PLT 198.0 02/20/2023   GLUCOSE 65 (L) 02/20/2023   CHOL 138 02/20/2023   TRIG 93.0 02/20/2023   HDL 52.70 02/20/2023   LDLCALC 67 02/20/2023   ALT 16 02/20/2023   AST 21 02/20/2023   NA 139 02/20/2023   K 3.7 02/20/2023   CL 108 02/20/2023   CREATININE 1.26 02/20/2023   BUN 18 02/20/2023   CO2 25 02/20/2023   PSA 0.99 08/16/2023   HGBA1C 7.1 08/16/2023   MICROALBUR 4.7 (H) 08/17/2022    EKG: Last EKG > 1 year ago. EKG on 02/28/20 showed: Normal sinus rhythm Non-specific T wave abnormality   CV: Echo 03/20/20: IMPRESSIONS   1. Left ventricular ejection fraction, by estimation, is 60 to 65%. The  left ventricle has normal function. The left ventricle has no regional  wall motion abnormalities. There is mild concentric left ventricular  hypertrophy. Left ventricular diastolic  parameters are consistent with Grade I diastolic dysfunction (impaired  relaxation).   2.  Right ventricular systolic function is normal. The right ventricular  size is normal. There is normal pulmonary artery systolic pressure. The  estimated right ventricular systolic pressure is 27.4 mmHg.   3. The mitral valve is normal in structure. Mild mitral valve  regurgitation. No evidence of mitral stenosis.   4. The aortic valve is normal in structure. There is severe  calcifcation of the aortic valve. Aortic valve regurgitation is  trivial. Mild to  moderate aortic valve stenosis. Aortic regurgitation PHT  measures 676 msec. Aortic valve mean gradient measures 19.0 mmHg.  Aortic valve peak gradient measures 33.4 mmHg. Aortic valve area, by  VTI measures 0.80 cm. Aortic valve Vmax measures 2.89 m/s.   5. The inferior vena cava is normal in size with greater than 50%  respiratory variability, suggesting right atrial pressure of 3 mmHg.    Past Medical History:  Diagnosis Date   Diabetes mellitus    Diabetes mellitus without complication (HCC)    Phreesia 09/09/2020   GERD (gastroesophageal reflux disease)    Heart murmur    03/20/20: mild MR & mild-moderate AS with mean grad 19 mmHg, peak grad 33.4, AVA VIT 0.80 cm   Hypertension    Kidney stones     Past Surgical History:  Procedure Laterality Date   COLONOSCOPY     LITHOTRIPSY     3 yrs ago    MEDICATIONS: No current facility-administered medications for this encounter.    acetaminophen (TYLENOL) 500 MG tablet   amLODipine (NORVASC) 5 MG tablet   ascorbic acid (VITAMIN C) 500 MG tablet   aspirin EC 81 MG tablet   diclofenac sodium (VOLTAREN) 1 % GEL   DM-APAP-CPM (CORICIDIN HBP PO)   famotidine (PEPCID) 20 MG tablet   fluticasone (FLONASE) 50 MCG/ACT nasal spray   glipiZIDE (GLUCOTROL) 5 MG tablet   lisinopril (ZESTRIL) 20 MG tablet   metFORMIN (GLUCOPHAGE-XR) 500 MG 24 hr tablet   Multiple Vitamins-Minerals (CENTRUM SILVER PO)   rosuvastatin (CRESTOR) 5 MG tablet   tadalafil (CIALIS) 20 MG tablet   dapagliflozin propanediol (FARXIGA) 10 MG TABS tablet   Last dose Marcelline Deist 09/27/23. Last ASA 09/17/23.   Shonna Chock, PA-C Surgical Short Stay/Anesthesiology [facility] Phone [phone] [facility] Phone [phone] 09/28/2023 1:05 PM

## 2023-09-29 ENCOUNTER — Telehealth: Payer: Self-pay

## 2023-09-29 ENCOUNTER — Ambulatory Visit (HOSPITAL_COMMUNITY): Admission: RE | Admit: 2023-09-29 | Payer: No Typology Code available for payment source | Source: Home / Self Care

## 2023-09-29 ENCOUNTER — Telehealth: Payer: Self-pay | Admitting: Emergency Medicine

## 2023-09-29 ENCOUNTER — Encounter: Payer: Self-pay | Admitting: Radiology

## 2023-09-29 HISTORY — DX: Gastro-esophageal reflux disease without esophagitis: K21.9

## 2023-09-29 SURGERY — MICROLARYNGOSCOPY WITH CO2 LASER AND EXCISION OF VOCAL CORD LESION
Anesthesia: Choice

## 2023-09-29 NOTE — Telephone Encounter (Signed)
Tried returning patients call, no answering and no voicemail box available. Need to inform patient that he needs to call his insurance and ask what alternative they will cover and then let us know

## 2023-09-29 NOTE — Telephone Encounter (Signed)
Copied from CRM [id number]. Topic: Clinical - Prescription Issue >> Sep 28, 2023  5:11 PM Taleah C wrote: Reason for CRM: pt called and stated that the [facility] cost for Marcelline Deist is too high and wantedto discuss alternative options. Please call and advise.

## 2023-09-29 NOTE — Telephone Encounter (Unsigned)
Copied from CRM [id number]. Topic: Clinical - Medical Advice >> Sep 29, 2023  3:07 PM Melissa C wrote: Reason for CRM: Patient was calling back on his two issues he has been asking about- Regarding the Marcelline Deist, I did relay information from CMA in messages, however patient is still inquiring about generic names that he could present to his insurance company to get quotes on.  Regarding his cardiology referral, he knows this all ties together, however he was just wondering if the doctor got the message and where he goes from here. Please advise with patient. He stated you can respond via Mychart or even text message if you are unable to reach him.  Thank you

## 2023-09-29 NOTE — Telephone Encounter (Signed)
Copied from CRM [id number]. Topic: General - Other >> Sep 28, 2023  5:14 PM Taleah C wrote: Reason for CRM: pt called and stated that he had a pre-op appointment scheduled for tomorrow but the department had to cancel the day before because they told him he needs to see a cardiologist first. Pt is requesting for someone from the clinic to give him a callback to discuss next steps. Please call and advise.

## 2023-10-06 ENCOUNTER — Other Ambulatory Visit: Payer: Self-pay | Admitting: Radiology

## 2023-10-06 NOTE — Telephone Encounter (Signed)
 It is more than just an EKG.  OV on Monday does not solve the problem.  He has not seen a cardiologist in 3 years and needs to see one before surgery.  Thanks.

## 2023-10-06 NOTE — Telephone Encounter (Unsigned)
 Copied from CRM [id number]. Topic: Clinical - Medical Advice >> Oct 06, 2023  9:46 AM Jacob Chan wrote: Reason for CRM: Patient is reaching back out to the clinic to follow up on Jacob Chan cardiologist referral - Jacob Chan procedure on Jacob Chan vocal chords were cancelled and they advised him that he needs to see the Cardiologist first. Patient called last Friday and hasn't heard back - can someone reach out to him via MyChart or attempt to call him to provide an update?

## 2023-10-09 ENCOUNTER — Encounter: Payer: Self-pay | Admitting: Emergency Medicine

## 2023-10-09 ENCOUNTER — Encounter: Payer: Self-pay | Admitting: Radiology

## 2023-10-09 ENCOUNTER — Ambulatory Visit (INDEPENDENT_AMBULATORY_CARE_PROVIDER_SITE_OTHER): Payer: No Typology Code available for payment source | Admitting: Emergency Medicine

## 2023-10-09 VITALS — BP 134/88 | HR 87 | Temp 98.8°F | Ht 69.0 in | Wt 183.0 lb

## 2023-10-09 DIAGNOSIS — I152 Hypertension secondary to endocrine disorders: Secondary | ICD-10-CM

## 2023-10-09 DIAGNOSIS — R490 Dysphonia: Secondary | ICD-10-CM | POA: Diagnosis not present

## 2023-10-09 DIAGNOSIS — E1159 Type 2 diabetes mellitus with other circulatory complications: Secondary | ICD-10-CM | POA: Diagnosis not present

## 2023-10-09 DIAGNOSIS — Z7984 Long term (current) use of oral hypoglycemic drugs: Secondary | ICD-10-CM | POA: Diagnosis not present

## 2023-10-09 DIAGNOSIS — I350 Nonrheumatic aortic (valve) stenosis: Secondary | ICD-10-CM | POA: Diagnosis not present

## 2023-10-09 DIAGNOSIS — E785 Hyperlipidemia, unspecified: Secondary | ICD-10-CM | POA: Diagnosis not present

## 2023-10-09 DIAGNOSIS — N1831 Chronic kidney disease, stage 3a: Secondary | ICD-10-CM | POA: Diagnosis not present

## 2023-10-09 DIAGNOSIS — E1169 Type 2 diabetes mellitus with other specified complication: Secondary | ICD-10-CM | POA: Diagnosis not present

## 2023-10-09 NOTE — Assessment & Plan Note (Signed)
 Well-controlled hypertension Recommend to continue amlodipine 5 mg daily and lisinopril 20 mg daily Hemoglobin A1c better than before at 7.1 Cardiovascular risks associated with uncontrolled diabetes discussed Diet and nutrition discussed Continue glipizide 5 mg daily and metformin 500 mg daily Continue Farxiga 10 mg daily Follow-up in 6 months

## 2023-10-09 NOTE — Assessment & Plan Note (Signed)
 Chronic stable condition Continue rosuvastatin  5 mg daily Diet and nutrition discussed

## 2023-10-09 NOTE — Assessment & Plan Note (Signed)
 Was evaluated by ENT Dr. And found to have polyp Scheduled for procedure but ENT requesting cardiology evaluation

## 2023-10-09 NOTE — Assessment & Plan Note (Signed)
Advised to stay well-hydrated and avoid NSAIDs Will benefit from starting Farxiga 10 mg daily

## 2023-10-09 NOTE — Progress Notes (Signed)
 Jacob Chan 68 y.o.   Chief Complaint  Patient presents with   surgical clearance    States they told him he needs to cardiologist. Will call ENT and see what exactly is needed     HISTORY OF PRESENT ILLNESS: This is a 68 y.o. male with history of hypertension, diabetes and dyslipidemia Chronic hoarseness.  Found to have vocal cord polyp.  Scheduled for surgical procedure but they will not proceed without cardiology evaluation Patient has history of nonrheumatic aortic valve stenosis.  Last time he saw cardiologist was about 3 years ago. No other complaints or medical concerns today.  HPI   Prior to Admission medications   Medication Sig Start Date End Date Taking? Authorizing Provider  acetaminophen  (TYLENOL ) 500 MG tablet Take 1,000 mg by mouth every 6 (six) hours as needed for mild pain (pain score 1-3) or moderate pain (pain score 4-6). PAIN   Yes [provider]  amLODipine  (NORVASC ) 5 MG tablet take one tablet daily in the morning. 12/20/22  Yes Nakema Fake, Isidro Margo, MD  diclofenac  sodium (VOLTAREN ) 1 % GEL Apply 2 g topically 2 (two) times daily. Patient taking differently: Apply 2 g topically daily as needed (pain). 02/18/19  Yes Kaisyn Millea, Isidro Margo, MD  DM-APAP-CPM (CORICIDIN HBP PO) Take by mouth as needed.   Yes [provider]  famotidine  (PEPCID ) 20 MG tablet Take 1 tablet (20 mg total) by mouth 2 (two) times daily. 08/16/23  Yes Soldatova, Liuba, MD  fluticasone  (FLONASE ) 50 MCG/ACT nasal spray Place 2 sprays into both nostrils daily. 08/16/23  Yes Soldatova, Liuba, MD  glipiZIDE  (GLUCOTROL ) 5 MG tablet take 1 tablet once a day before breakfast. follow-up appt due in june must see md refills 04/21/23  Yes Sabeen Piechocki, Isidro Margo, MD  lisinopril  (ZESTRIL ) 20 MG tablet TAKE 1 TABLET ONCE DAILY. 12/20/22  Yes Jamieson Lisa, Isidro Margo, MD  metFORMIN  (GLUCOPHAGE -XR) 500 MG 24 hr tablet Take 1 tablet (500 mg total) by mouth daily. 04/21/23 04/15/24 Yes Jozlynn Plaia,  Isidro Margo, MD  Multiple Vitamins-Minerals (CENTRUM SILVER PO) Take 1 tablet by mouth daily. Men 50+   Yes [provider]  rosuvastatin  (CRESTOR ) 5 MG tablet TAKE 1 TABLET ONCE DAILY. 12/20/22  Yes Sierah Lacewell, Isidro Margo, MD  tadalafil  (CIALIS ) 20 MG tablet Take 1 tablet (20 mg total) by mouth daily as needed for erectile dysfunction. 02/20/23  Yes Donavin Audino, Isidro Margo, MD  ascorbic acid (VITAMIN C) 500 MG tablet Take 500 mg by mouth daily. Patient not taking: Reported on 10/09/2023    [provider]  aspirin EC 81 MG tablet Take 81 mg by mouth daily. Patient not taking: Reported on 10/09/2023    [provider]  dapagliflozin  propanediol (FARXIGA ) 10 MG TABS tablet take 1 tablet by mouth daily before breakfast. Patient not taking: Reported on 10/09/2023 09/27/23   Nayana Lenig Jose, MD    No Known Allergies  Patient Active Problem List   Diagnosis Date Noted   Stage 3a chronic kidney disease (HCC) 02/20/2023   Diverticulosis 02/15/2021   Chronic hoarseness 02/15/2021   Nonrheumatic aortic valve stenosis 02/15/2021   Hypertension associated with diabetes (HCC) 12/20/2016   Dyslipidemia associated with type 2 diabetes mellitus (HCC) 12/20/2016    Past Medical History:  Diagnosis Date   Diabetes mellitus    Diabetes mellitus without complication (HCC)    Phreesia 09/09/2020   GERD (gastroesophageal reflux disease)    Heart murmur    03/20/20: mild MR & mild-moderate AS with mean grad  19 mmHg, peak grad 33.4, AVA VIT 0.80 cm   Hypertension    Kidney stones     Past Surgical History:  Procedure Laterality Date   COLONOSCOPY     LITHOTRIPSY     3 yrs ago    Social History   Socioeconomic History   Marital status: Married    Spouse name: [name]   Number of children: 4   Years of education: Not on file   Highest education level: Not on file  Occupational History   Not on file  Tobacco Use   Smoking status: Never   Smokeless tobacco: Never   Vaping Use   Vaping status: Never Used  Substance and Sexual Activity   Alcohol use: No   Drug use: No   Sexual activity: Yes  Other Topics Concern   Not on file  Social History Narrative   Not on file   Social Drivers of Health   Financial Resource Strain: Low Risk  (04/21/2023)   Overall Financial Resource Strain (CARDIA)    Difficulty of Paying Living Expenses: Not hard at all  Food Insecurity: No Food Insecurity (04/21/2023)   Hunger Vital Sign    Worried About Running Out of Food in the Last Year: Never true    Ran Out of Food in the Last Year: Never true  Transportation Needs: No Transportation Needs (04/21/2023)   PRAPARE - Administrator, Civil Service (Medical): No    Lack of Transportation (Non-Medical): No  Physical Activity: Sufficiently Active (04/21/2023)   Exercise Vital Sign    Days of Exercise per Week: 5 days    Minutes of Exercise per Session: 40 min  Stress: Stress Concern Present (04/21/2023)   [employer] of Occupational Health - Occupational Stress Questionnaire    Feeling of Stress : To some extent  Social Connections: Socially Integrated (04/21/2023)   Social Connection and Isolation Panel [NHANES]    Frequency of Communication with Friends and Family: Twice a week    Frequency of Social Gatherings with Friends and Family: Three times a week    Attends Religious Services: More than 4 times per year    Active Member of Clubs or Organizations: Yes    Attends Banker Meetings: More than 4 times per year    Marital Status: Married  Catering manager Violence: Not At Risk (04/21/2023)   Humiliation, Afraid, Rape, and Kick questionnaire    Fear of Current or Ex-Partner: No    Emotionally Abused: No    Physically Abused: No    Sexually Abused: No    Family History  Problem Relation Age of Onset   Diabetes Mother    Heart disease Mother    Diabetes Father    Colon cancer Neg Hx    Esophageal cancer Neg Hx    Stomach cancer  Neg Hx    Rectal cancer Neg Hx      Review of Systems  Constitutional: Negative.  Negative for chills and fever.  HENT: Negative.  Negative for congestion and sore throat.   Respiratory: Negative.  Negative for cough and shortness of breath.   Cardiovascular: Negative.  Negative for chest pain and palpitations.  Gastrointestinal:  Negative for abdominal pain, diarrhea, nausea and vomiting.  Genitourinary: Negative.  Negative for dysuria and hematuria.  Skin: Negative.  Negative for rash.  Neurological: Negative.  Negative for dizziness and headaches.  All other systems reviewed and are negative.   Vitals:   10/09/23 1520  BP: 134/88  Pulse: 87  Temp: 98.8 F (37.1 C)  SpO2: 96%    Physical Exam Constitutional:      Appearance: Normal appearance.  HENT:     Head: Normocephalic.     Mouth/Throat:     Mouth: Mucous membranes are moist.     Pharynx: Oropharynx is clear.  Eyes:     Extraocular Movements: Extraocular movements intact.  Cardiovascular:     Rate and Rhythm: Normal rate and regular rhythm.     Heart sounds: Murmur (Systolic 3/6 murmur) heard.  Pulmonary:     Effort: Pulmonary effort is normal.     Breath sounds: Normal breath sounds.  Abdominal:     Palpations: Abdomen is soft.     Tenderness: There is no abdominal tenderness.  Musculoskeletal:     Right lower leg: No edema.     Left lower leg: No edema.  Skin:    General: Skin is warm and dry.  Neurological:     Mental Status: He is alert and oriented to person, place, and time.  Psychiatric:        Mood and Affect: Mood normal.        Behavior: Behavior normal.    EKG: Normal sinus with ventricular rate of 77.Aaron Aas  Left atrial enlargement.  No acute ischemic changes.  ASSESSMENT & PLAN: A total of 43 minutes was spent with the patient and counseling/coordination of care regarding preparing for this visit, review of most recent office visit notes, review of multiple chronic medical conditions and  their management, review of all medications, review of most recent bloodwork results, review of health maintenance items, education on nutrition, prognosis, documentation, and need for follow up.   Problem List Items Addressed This Visit       Cardiovascular and Mediastinum   Hypertension associated with diabetes (HCC) - Primary   Well-controlled hypertension Recommend to continue amlodipine  5 mg daily and lisinopril  20 mg daily Hemoglobin A1c better than before at 7.1 Cardiovascular risks associated with uncontrolled diabetes discussed Diet and nutrition discussed Continue glipizide  5 mg daily and metformin  500 mg daily Continue Farxiga  10 mg daily Follow-up in 6 months      Nonrheumatic aortic valve stenosis   Stable. Asymptomatic       Relevant Orders   Ambulatory referral to Cardiology     Endocrine   Dyslipidemia associated with type 2 diabetes mellitus (HCC)   Chronic stable condition Continue rosuvastatin  5 mg daily Diet and nutrition discussed        Genitourinary   Stage 3a chronic kidney disease (HCC)   Advised to stay well-hydrated and avoid NSAIDs Will benefit from starting Farxiga  10 mg daily        Other   Chronic hoarseness   Was evaluated by ENT Dr. And found to have polyp Scheduled for procedure but ENT requesting cardiology evaluation      Patient Instructions  Health Maintenance After Age 62 After age 43, you are at a higher risk for certain long-term diseases and infections as well as injuries from falls. Falls are a major cause of broken bones and head injuries in people who are older than age 65. Getting regular preventive care can help to keep you healthy and well. Preventive care includes getting regular testing and making lifestyle changes as recommended by your health care provider. Talk with your health care provider about: Which screenings and tests you should have. A screening is a test that checks for a disease when you have no  symptoms. A diet and exercise plan that is right for you. What should I know about screenings and tests to prevent falls? Screening and testing are the best ways to find a health problem early. Early diagnosis and treatment give you the best chance of managing medical conditions that are common after age 3. Certain conditions and lifestyle choices may make you more likely to have a fall. Your health care provider may recommend: Regular vision checks. Poor vision and conditions such as cataracts can make you more likely to have a fall. If you wear glasses, make sure to get your prescription updated if your vision changes. Medicine review. Work with your health care provider to regularly review all of the medicines you are taking, including over-the-counter medicines. Ask your health care provider about any side effects that may make you more likely to have a fall. Tell your health care provider if any medicines that you take make you feel dizzy or sleepy. Strength and balance checks. Your health care provider may recommend certain tests to check your strength and balance while standing, walking, or changing positions. Foot health exam. Foot pain and numbness, as well as not wearing proper footwear, can make you more likely to have a fall. Screenings, including: Osteoporosis screening. Osteoporosis is a condition that causes the bones to get weaker and break more easily. Blood pressure screening. Blood pressure changes and medicines to control blood pressure can make you feel dizzy. Depression screening. You may be more likely to have a fall if you have a fear of falling, feel depressed, or feel unable to do activities that you used to do. Alcohol use screening. Using too much alcohol can affect your balance and may make you more likely to have a fall. Follow these instructions at home: Lifestyle Do not drink alcohol if: Your health care provider tells you not to drink. If you drink alcohol: Limit  how much you have to: 0-1 drink a day for women. 0-2 drinks a day for men. Know how much alcohol is in your drink. In the [place] one drink equals one 12 oz bottle of beer (355 mL), one 5 oz glass of wine (148 mL), or one 1 oz glass of hard liquor (44 mL). Do not use any products that contain nicotine or tobacco. These products include cigarettes, chewing tobacco, and vaping devices, such as e-cigarettes. If you need help quitting, ask your health care provider. Activity  Follow a regular exercise program to stay fit. This will help you maintain your balance. Ask your health care provider what types of exercise are appropriate for you. If you need a cane or walker, use it as recommended by your health care provider. Wear supportive shoes that have nonskid soles. Safety  Remove any tripping hazards, such as rugs, cords, and clutter. Install safety equipment such as grab bars in bathrooms and safety rails on stairs. Keep rooms and walkways well-lit. General instructions Talk with your health care provider about your risks for falling. Tell your health care provider if: You fall. Be sure to tell your health care provider about all falls, even ones that seem minor. You feel dizzy, tiredness (fatigue), or off-balance. Take over-the-counter and prescription medicines only as told by your health care provider. These include supplements. Eat a healthy diet and maintain a healthy weight. A healthy diet includes low-fat dairy products, low-fat (lean) meats, and fiber from whole grains, beans, and lots of fruits and vegetables. Stay current with your vaccines. Schedule regular health, dental, and  eye exams. Summary Having a healthy lifestyle and getting preventive care can help to protect your health and wellness after age 78. Screening and testing are the best way to find a health problem early and help you avoid having a fall. Early diagnosis and treatment give you the best chance for managing medical  conditions that are more common for people who are older than age 2. Falls are a major cause of broken bones and head injuries in people who are older than age 64. Take precautions to prevent a fall at home. Work with your health care provider to learn what changes you can make to improve your health and wellness and to prevent falls. This information is not intended to replace advice given to you by your health care provider. Make sure you discuss any questions you have with your health care provider. Document Revised: 01/04/2021 Document Reviewed: 01/04/2021 Elsevier Patient Education  2024 Elsevier Inc.    Maryagnes Small, MD [facility]

## 2023-10-09 NOTE — Patient Instructions (Signed)
 Health Maintenance After Age 69 After age 4, you are at a higher risk for certain long-term diseases and infections as well as injuries from falls. Falls are a major cause of broken bones and head injuries in people who are older than age 47. Getting regular preventive care can help to keep you healthy and well. Preventive care includes getting regular testing and making lifestyle changes as recommended by your health care provider. Talk with your health care provider about: Which screenings and tests you should have. A screening is a test that checks for a disease when you have no symptoms. A diet and exercise plan that is right for you. What should I know about screenings and tests to prevent falls? Screening and testing are the best ways to find a health problem early. Early diagnosis and treatment give you the best chance of managing medical conditions that are common after age 37. Certain conditions and lifestyle choices may make you more likely to have a fall. Your health care provider may recommend: Regular vision checks. Poor vision and conditions such as cataracts can make you more likely to have a fall. If you wear glasses, make sure to get your prescription updated if your vision changes. Medicine review. Work with your health care provider to regularly review all of the medicines you are taking, including over-the-counter medicines. Ask your health care provider about any side effects that may make you more likely to have a fall. Tell your health care provider if any medicines that you take make you feel dizzy or sleepy. Strength and balance checks. Your health care provider may recommend certain tests to check your strength and balance while standing, walking, or changing positions. Foot health exam. Foot pain and numbness, as well as not wearing proper footwear, can make you more likely to have a fall. Screenings, including: Osteoporosis screening. Osteoporosis is a condition that causes  the bones to get weaker and break more easily. Blood pressure screening. Blood pressure changes and medicines to control blood pressure can make you feel dizzy. Depression screening. You may be more likely to have a fall if you have a fear of falling, feel depressed, or feel unable to do activities that you used to do. Alcohol use screening. Using too much alcohol can affect your balance and may make you more likely to have a fall. Follow these instructions at home: Lifestyle Do not drink alcohol if: Your health care provider tells you not to drink. If you drink alcohol: Limit how much you have to: 0-1 drink a day for women. 0-2 drinks a day for men. Know how much alcohol is in your drink. In the [place] one drink equals one 12 oz bottle of beer (355 mL), one 5 oz glass of wine (148 mL), or one 1 oz glass of hard liquor (44 mL). Do not use any products that contain nicotine or tobacco. These products include cigarettes, chewing tobacco, and vaping devices, such as e-cigarettes. If you need help quitting, ask your health care provider. Activity  Follow a regular exercise program to stay fit. This will help you maintain your balance. Ask your health care provider what types of exercise are appropriate for you. If you need a cane or walker, use it as recommended by your health care provider. Wear supportive shoes that have nonskid soles. Safety  Remove any tripping hazards, such as rugs, cords, and clutter. Install safety equipment such as grab bars in bathrooms and safety rails on stairs. Keep rooms and walkways  well-lit. General instructions Talk with your health care provider about your risks for falling. Tell your health care provider if: You fall. Be sure to tell your health care provider about all falls, even ones that seem minor. You feel dizzy, tiredness (fatigue), or off-balance. Take over-the-counter and prescription medicines only as told by your health care provider. These include  supplements. Eat a healthy diet and maintain a healthy weight. A healthy diet includes low-fat dairy products, low-fat (lean) meats, and fiber from whole grains, beans, and lots of fruits and vegetables. Stay current with your vaccines. Schedule regular health, dental, and eye exams. Summary Having a healthy lifestyle and getting preventive care can help to protect your health and wellness after age 11. Screening and testing are the best way to find a health problem early and help you avoid having a fall. Early diagnosis and treatment give you the best chance for managing medical conditions that are more common for people who are older than age 28. Falls are a major cause of broken bones and head injuries in people who are older than age 48. Take precautions to prevent a fall at home. Work with your health care provider to learn what changes you can make to improve your health and wellness and to prevent falls. This information is not intended to replace advice given to you by your health care provider. Make sure you discuss any questions you have with your health care provider. Document Revised: 01/04/2021 Document Reviewed: 01/04/2021 Elsevier Patient Education  2024 [employer].

## 2023-10-09 NOTE — Assessment & Plan Note (Signed)
 Stable. Asymptomatic.

## 2023-10-10 ENCOUNTER — Encounter: Payer: Self-pay | Admitting: Radiology

## 2023-10-10 ENCOUNTER — Encounter: Payer: Self-pay | Admitting: Emergency Medicine

## 2023-10-10 ENCOUNTER — Telehealth: Payer: Self-pay | Admitting: Emergency Medicine

## 2023-10-10 NOTE — Telephone Encounter (Signed)
Copied from CRM [id number]. Topic: General - Other >> Oct 09, 2023  4:12 PM Irine Seal wrote: Reason for CRM: patient was seen in office today and forgot to get a work excuse  for today up-loaded into his [address]

## 2023-10-10 NOTE — Telephone Encounter (Signed)
Copied from CRM [id number]. Topic: General - Other >> Oct 10, 2023  1:17 PM Turkey A wrote: Reason for CRM: Patient called regarding follow-up to letter.Patient said if the letter can be on a letter head with his name and yesterday's date stating that he was at the appointment. Patient asked if letter could be mailed to him please

## 2023-10-10 NOTE — Telephone Encounter (Signed)
Letter printed and mailed to the address on file.

## 2023-10-11 ENCOUNTER — Encounter (INDEPENDENT_AMBULATORY_CARE_PROVIDER_SITE_OTHER): Payer: Self-pay

## 2023-10-11 ENCOUNTER — Telehealth (INDEPENDENT_AMBULATORY_CARE_PROVIDER_SITE_OTHER): Payer: Self-pay | Admitting: Otolaryngology

## 2023-10-11 NOTE — Telephone Encounter (Signed)
Called patient per Dr. Leighton Roach request on 10/10/2023 and was unable to leave a voicemail message because the mailbox was not set up.  Sent MyChart message requesting patient call our office.  Dr. Irene Pap wanted to schedule a 6 week follow up from now after patient sees Cardiology as she had recommended.

## 2023-10-13 ENCOUNTER — Encounter (INDEPENDENT_AMBULATORY_CARE_PROVIDER_SITE_OTHER): Payer: No Typology Code available for payment source | Admitting: Otolaryngology

## 2023-10-23 ENCOUNTER — Other Ambulatory Visit: Payer: Self-pay | Admitting: Emergency Medicine

## 2023-10-23 ENCOUNTER — Other Ambulatory Visit: Payer: Self-pay | Admitting: Radiology

## 2023-10-23 DIAGNOSIS — I152 Hypertension secondary to endocrine disorders: Secondary | ICD-10-CM

## 2023-10-23 MED ORDER — LISINOPRIL 20 MG PO TABS: 20.0000 mg | ORAL_TABLET | Freq: Every day | ORAL | 3 refills | Status: DC

## 2023-10-23 NOTE — Telephone Encounter (Signed)
 Last Fill: 12/20/22  Last OV: 10/09/23 Next OV: None Scheduled  Routing to provider for review/authorization.

## 2023-10-23 NOTE — Telephone Encounter (Signed)
 Copied from CRM [id number]. Topic: Clinical - Medication Refill >> Oct 23, 2023  9:42 AM Kathryne Eriksson wrote: Most Recent Primary Care Visit:  Provider: [name]  Department: [facility]  Visit Type: OFFICE VISIT  Date: 10/09/2023  Medication: lisinopril (ZESTRIL) 20 MG tablet  Has the patient contacted their pharmacy? Yes (Agent: If no, request that the patient contact the pharmacy for the refill. If patient does not wish to contact the pharmacy document the reason why and proceed with request.) (Agent: If yes, when and what did the pharmacy advise?)  Is this the correct pharmacy for this prescription? Yes If no, delete pharmacy and type the correct one.  This is the patient's preferred pharmacy:  [facility] - [place], [place] - [address] [place] [address] Phone: [phone] Fax: [phone]    Has the prescription been filled recently? No  Is the patient out of the medication? Yes  Has the patient been seen for an appointment in the last year OR does the patient have an upcoming appointment? Yes  Can we respond through MyChart? Yes  Agent: Please be advised that Rx refills may take up to 3 business days. We ask that you follow-up with your pharmacy.

## 2023-11-02 NOTE — Progress Notes (Signed)
 Cardiology Office Note:    Date:  11/03/2023   ID:  Jacob Chan, DOB 1956-03-12, MRN [id number]  PCP:  [name], MD  Cardiologist:  Norman Herrlich, MD   Referring MD: [name], *  ASSESSMENT:    1. Preoperative cardiovascular examination   2. Nonrheumatic aortic valve stenosis   3. Nonrheumatic aortic valve insufficiency   4. Nonrheumatic mitral valve regurgitation   5. Hypertensive heart disease without heart failure   6. Dyslipidemia associated with type 2 diabetes mellitus (HCC)   7. Stage 3a chronic kidney disease (HCC)   8. Type 2 diabetes mellitus without complication, without long-term current use of insulin (HCC)    PLAN:    In order of problems listed above:  From a preoperative perspective he has aortic valve disease asymptomatic but the severity is in question Her to be able to do an echocardiogram today unless it is severe I would proceed with his planned surgery and he will need yearly follow-up in cardiology I will review that echocardiogram before he leaves my office His blood pressure is well-controlled continue his ACE inhibitor Stable diabetes last A1c 7.3 Stable CKD his last creatinine 1.26 Stable statin indicated with aortic valve disease  I reviewed his echocardiogram live in the office as we did his mean gradients in the range of 30 mmHg normal left ventricular function moderate aortic stenosis asymptomatic proceed with his planned ENT surgery.  He is optimized he requires no further cardiac evaluation or intervention.  Next appointment 1 year in [place]   Medication Adjustments/Labs and Tests Ordered: Current medicines are reviewed at length with the patient today.  Concerns regarding medicines are outlined above.  Orders Placed This Encounter  Procedures   EKG 12-Lead   No orders of the defined types were placed in this encounter.    Chief Complaint  Patient presents with   Medical Clearance    H/o heart murmur     Fatigue   hand swelling     History of Present Illness:    Jacob Chan is a 68 y.o. male with a history of heart murmur diabetes I lipidemia taking a statin and hypertension who is being seen today for the evaluation of stenosis at the request of [name], *.  He was seen  by my partner Dr. Lennie Odor in [facility] July 2021 for heart murmur.  An echocardiogram was performed showing the aortic valve to be abnormal with moderate stenosis and mild regurgitation mean gradient across the valve was 19 mmHg left ventricle normal in size mild LVH normal systolic function EF 60 to 65% with grade 1 diastolic dysfunction filling pattern.  He also had mild mitral regurgitation.  The aortic valve was trileaflet and the aortic root was normal.  Despite mild stenosis the valve was described as severely thickened and calcified.  I independently reviewed that echocardiogram the valve is quite calcified and restricted it may well be bicuspid.  He is here today because he is pending ENT surgery vocal cords He thinks he is going to have general anesthesia same day and be discharged Despite his aortic valve disease he has no cardiovascular symptoms of edema shortness of breath chest pain palpitation or syncope His daughter had aortic valve replacement age 42  Past Medical History:  Diagnosis Date   Diabetes mellitus    Diabetes mellitus without complication (HCC)    Phreesia 09/09/2020   GERD (gastroesophageal reflux disease)    Heart murmur  03/20/20: mild MR & mild-moderate AS with mean grad 19 mmHg, peak grad 33.4, AVA VIT 0.80 cm   Hypertension    Kidney stones     Past Surgical History:  Procedure Laterality Date   COLONOSCOPY     LITHOTRIPSY     3 yrs ago    Current Medications: Current Meds  Medication Sig   acetaminophen (TYLENOL) 500 MG tablet Take 1,000 mg by mouth every 6 (six) hours as needed for mild pain (pain score 1-3) or moderate pain (pain score 4-6).  PAIN   amLODipine (NORVASC) 5 MG tablet take one tablet daily in the morning. (Patient taking differently: Take 5 mg by mouth daily.)   ascorbic acid (VITAMIN C) 500 MG tablet Take 500 mg by mouth daily.   aspirin EC 81 MG tablet Take 81 mg by mouth daily.   dapagliflozin propanediol (FARXIGA) 10 MG TABS tablet take 1 tablet by mouth daily before breakfast.   diclofenac sodium (VOLTAREN) 1 % GEL Apply 2 g topically 2 (two) times daily. (Patient taking differently: Apply 2 g topically daily as needed (pain).)   fluticasone (FLONASE) 50 MCG/ACT nasal spray Place 2 sprays into both nostrils daily.   glipiZIDE (GLUCOTROL) 5 MG tablet take 1 tablet once a day before breakfast. follow-up appt due in june must see md refills (Patient taking differently: Take 5 mg by mouth daily before breakfast. TAKE 1 TABLET ONCE A DAY BEFORE BREAKFAST. Follow-up appt due in June must see MD refills)   lisinopril (ZESTRIL) 20 MG tablet Take 1 tablet (20 mg total) by mouth daily.   metFORMIN (GLUCOPHAGE-XR) 500 MG 24 hr tablet Take 1 tablet (500 mg total) by mouth daily.   Multiple Vitamins-Minerals (CENTRUM SILVER PO) Take 1 tablet by mouth daily. Men 50+   rosuvastatin (CRESTOR) 5 MG tablet TAKE 1 TABLET ONCE DAILY.   tadalafil (CIALIS) 20 MG tablet Take 1 tablet (20 mg total) by mouth daily as needed for erectile dysfunction.   [DISCONTINUED] DM-APAP-CPM (CORICIDIN HBP PO) Take 1 tablet by mouth as needed (pain).   [DISCONTINUED] famotidine (PEPCID) 20 MG tablet Take 1 tablet (20 mg total) by mouth 2 (two) times daily.     Allergies:   Patient has no known allergies.   Social History   Socioeconomic History   Marital status: Married    Spouse name: [name]   Number of children: 4   Years of education: Not on file   Highest education level: Not on file  Occupational History   Not on file  Tobacco Use   Smoking status: Never   Smokeless tobacco: Never  Vaping Use   Vaping status: Never Used  Substance and  Sexual Activity   Alcohol use: No   Drug use: No   Sexual activity: Yes  Other Topics Concern   Not on file  Social History Narrative   Not on file   Social Drivers of Health   Financial Resource Strain: Low Risk  (04/21/2023)   Overall Financial Resource Strain (CARDIA)    Difficulty of Paying Living Expenses: Not hard at all  Food Insecurity: No Food Insecurity (04/21/2023)   Hunger Vital Sign    Worried About Running Out of Food in the Last Year: Never true    Ran Out of Food in the Last Year: Never true  Transportation Needs: No Transportation Needs (04/21/2023)   PRAPARE - Administrator, Civil Service (Medical): No    Lack of Transportation (Non-Medical): No  Physical Activity:  Sufficiently Active (04/21/2023)   Exercise Vital Sign    Days of Exercise per Week: 5 days    Minutes of Exercise per Session: 40 min  Stress: Stress Concern Present (04/21/2023)   [employer] of Occupational Health - Occupational Stress Questionnaire    Feeling of Stress : To some extent  Social Connections: Socially Integrated (04/21/2023)   Social Connection and Isolation Panel [NHANES]    Frequency of Communication with Friends and Family: Twice a week    Frequency of Social Gatherings with Friends and Family: Three times a week    Attends Religious Services: More than 4 times per year    Active Member of Clubs or Organizations: Yes    Attends Engineer, structural: More than 4 times per year    Marital Status: Married     Family History: The patient's family history includes Diabetes in his father and mother; Heart disease in his mother. There is no history of Colon cancer, Esophageal cancer, Stomach cancer, or Rectal cancer.  ROS:   ROS Please see the history of present illness.     All other systems reviewed and are negative.  EKGs/Labs/Other Studies Reviewed:    The following studies were reviewed today:  EKG shows sinus rhythm late transition otherwise  normal  Cardiac Studies & Procedures   ______________________________________________________________________________________________     ECHOCARDIOGRAM  ECHOCARDIOGRAM COMPLETE 03/20/2020  Narrative ECHOCARDIOGRAM REPORT    Patient Name:   ZAIM NITTA Date of Exam: 03/20/2020 Medical Rec #:  [id number]          Height:       69.5 in Accession #:    [id number]         Weight:       190.0 lb Date of Birth:  Nov 11, 1955           BSA:          2.031 m Patient Age:    68 years           BP:           130/82 mmHg Patient Gender: M                  HR:           76 bpm. Exam Location:  [address]  Procedure: 2D Echo, 3D Echo, Cardiac Doppler and Color Doppler  Indications:    R01.1  History:        Patient has no prior history of Echocardiogram examinations. Signs/Symptoms:Murmur; Risk Factors:Hypertension, Dyslipidemia and Diabetes.  Sonographer:    Samule Ohm RDCS Referring Phys: [id number] Ronnald Ramp O'NEAL  IMPRESSIONS   1. Left ventricular ejection fraction, by estimation, is 60 to 65%. The left ventricle has normal function. The left ventricle has no regional wall motion abnormalities. There is mild concentric left ventricular hypertrophy. Left ventricular diastolic parameters are consistent with Grade I diastolic dysfunction (impaired relaxation). 2. Right ventricular systolic function is normal. The right ventricular size is normal. There is normal pulmonary artery systolic pressure. The estimated right ventricular systolic pressure is 27.4 mmHg. 3. The mitral valve is normal in structure. Mild mitral valve regurgitation. No evidence of mitral stenosis. 4. The aortic valve is normal in structure. Aortic valve regurgitation is trivial. Mild to moderate aortic valve stenosis. Aortic regurgitation PHT measures 676 msec. Aortic valve mean gradient measures 19.0 mmHg. Aortic valve Vmax measures 2.89 m/s. 5. The inferior vena cava is normal in size with greater than  50% respiratory  variability, suggesting right atrial pressure of 3 mmHg.  FINDINGS Left Ventricle: Left ventricular ejection fraction, by estimation, is 60 to 65%. The left ventricle has normal function. The left ventricle has no regional wall motion abnormalities. The left ventricular internal cavity size was normal in size. There is mild concentric left ventricular hypertrophy. Left ventricular diastolic parameters are consistent with Grade I diastolic dysfunction (impaired relaxation). Normal left ventricular filling pressure.  Right Ventricle: The right ventricular size is normal. No increase in right ventricular wall thickness. Right ventricular systolic function is normal. There is normal pulmonary artery systolic pressure. The tricuspid regurgitant velocity is 2.47 m/s, and with an assumed right atrial pressure of 3 mmHg, the estimated right ventricular systolic pressure is 27.4 mmHg.  Left Atrium: Left atrial size was normal in size.  Right Atrium: Right atrial size was normal in size.  Pericardium: There is no evidence of pericardial effusion.  Mitral Valve: The mitral valve is normal in structure. Normal mobility of the mitral valve leaflets. Mild mitral valve regurgitation. No evidence of mitral valve stenosis.  Tricuspid Valve: The tricuspid valve is normal in structure. Tricuspid valve regurgitation is mild . No evidence of tricuspid stenosis.  Aortic Valve: The aortic valve is normal in structure.. There is severe thickening and severe calcifcation of the aortic valve. Aortic valve regurgitation is trivial. Aortic regurgitation PHT measures 676 msec. Mild to moderate aortic stenosis is present. There is severe thickening of the aortic valve. There is severe calcifcation of the aortic valve. Aortic valve mean gradient measures 19.0 mmHg. Aortic valve peak gradient measures 33.4 mmHg. Aortic valve area, by VTI measures 0.80 cm.  Pulmonic Valve: The pulmonic valve was normal in  structure. Pulmonic valve regurgitation is trivial. No evidence of pulmonic stenosis.  Aorta: The aortic root is normal in size and structure.  Venous: The inferior vena cava was not well visualized. The inferior vena cava is normal in size with greater than 50% respiratory variability, suggesting right atrial pressure of 3 mmHg.  IAS/Shunts: No atrial level shunt detected by color flow Doppler.   LEFT VENTRICLE PLAX 2D LVIDd:         4.00 cm  Diastology LVIDs:         2.30 cm  LV e' lateral:   7.94 cm/s LV PW:         1.20 cm  LV E/e' lateral: 10.3 LV IVS:        1.20 cm  LV e' medial:    7.07 cm/s LVOT diam:     1.80 cm  LV E/e' medial:  11.6 LV SV:         49 LV SV Index:   24 LVOT Area:     2.54 cm   RIGHT VENTRICLE             IVC RV S prime:     14.90 cm/s  IVC diam: 1.40 cm TAPSE (M-mode): 1.9 cm RVSP:           27.4 mmHg  LEFT ATRIUM             Index       RIGHT ATRIUM           Index LA diam:        3.50 cm 1.72 cm/m  RA Pressure: 3.00 mmHg LA Vol (A2C):   62.1 ml 30.57 ml/m RA Area:     12.20 cm LA Vol (A4C):   55.7 ml 27.42 ml/m RA Volume:  28.60 ml  14.08 ml/m LA Biplane Vol: 60.1 ml 29.58 ml/m AORTIC VALVE AV Area (Vmax):    0.81 cm AV Area (Vmean):   0.75 cm AV Area (VTI):     0.80 cm AV Vmax:           289.00 cm/s AV Vmean:          206.000 cm/s AV VTI:            0.615 m AV Peak Grad:      33.4 mmHg AV Mean Grad:      19.0 mmHg LVOT Vmax:         91.70 cm/s LVOT Vmean:        61.100 cm/s LVOT VTI:          0.193 m LVOT/AV VTI ratio: 0.31 AI PHT:            676 msec  AORTA Ao Root diam: 3.30 cm Ao Asc diam:  3.60 cm  MV E velocity: 81.90 cm/s  TRICUSPID VALVE MV A velocity: 90.70 cm/s  TR Peak grad:   24.4 mmHg MV E/A ratio:  0.90        TR Vmax:        247.00 cm/s Estimated RAP:  3.00 mmHg RVSP:           27.4 mmHg  SHUNTS Systemic VTI:  0.19 m Systemic Diam: 1.80 cm  Armanda Magic MD Electronically signed by Armanda Magic  MD Signature Date/Time: 03/20/2020/8:25:32 AM    Final          ______________________________________________________________________________________________      EKG:  EKG is  ordered today.  The ekg ordered today is personally reviewed and demonstrates   Recent Labs: 02/20/2023: ALT 16; BUN 18; Creatinine, Ser 1.26; Hemoglobin 13.5; Platelets 198.0; Potassium 3.7; Sodium 139  Recent Lipid Panel    Component Value Date/Time   CHOL 138 02/20/2023 1604   CHOL 153 01/29/2020 1204   TRIG 93.0 02/20/2023 1604   HDL 52.70 02/20/2023 1604   HDL 59 01/29/2020 1204   CHOLHDL 3 02/20/2023 1604   VLDL 18.6 02/20/2023 1604   LDLCALC 67 02/20/2023 1604   LDLCALC 80 01/29/2020 1204    Physical Exam:    VS:  BP 110/74 (BP Location: Right Arm, Patient Position: Sitting)   Pulse 86   Ht 5' 9.75" (1.772 m)   Wt 183 lb 12.8 oz (83.4 kg)   SpO2 97%   BMI 26.56 kg/m     Wt Readings from Last 3 Encounters:  11/03/23 183 lb 12.8 oz (83.4 kg)  10/09/23 183 lb (83 kg)  08/16/23 182 lb (82.6 kg)     GEN:  Well nourished, well developed in no acute distress HEENT: Normal NECK: No JVD; No carotid bruits LYMPHATICS: No lymphadenopathy CARDIAC: Grade 2/6 to 3/6 systolic ejection murmur that radiates to the base of the carotids seems to extended to S2 no aortic regurgitation RRR, RESPIRATORY:  Clear to auscultation without rales, wheezing or rhonchi  ABDOMEN: Soft, non-tender, non-distended MUSCULOSKELETAL:  No edema; No deformity  SKIN: Warm and dry NEUROLOGIC:  Alert and oriented x 3 PSYCHIATRIC:  Normal affect     Signed, Norman Herrlich, MD  11/03/2023 4:12 PM    [facility]

## 2023-11-03 ENCOUNTER — Ambulatory Visit: Payer: No Typology Code available for payment source | Attending: Cardiology | Admitting: Cardiology

## 2023-11-03 ENCOUNTER — Encounter: Payer: Self-pay | Admitting: Cardiology

## 2023-11-03 ENCOUNTER — Ambulatory Visit: Attending: Cardiology

## 2023-11-03 VITALS — BP 110/74 | HR 86 | Ht 69.75 in | Wt 183.8 lb

## 2023-11-03 DIAGNOSIS — I350 Nonrheumatic aortic (valve) stenosis: Secondary | ICD-10-CM | POA: Diagnosis not present

## 2023-11-03 DIAGNOSIS — Z01810 Encounter for preprocedural cardiovascular examination: Secondary | ICD-10-CM

## 2023-11-03 DIAGNOSIS — I340 Nonrheumatic mitral (valve) insufficiency: Secondary | ICD-10-CM

## 2023-11-03 DIAGNOSIS — I119 Hypertensive heart disease without heart failure: Secondary | ICD-10-CM

## 2023-11-03 DIAGNOSIS — E1169 Type 2 diabetes mellitus with other specified complication: Secondary | ICD-10-CM | POA: Diagnosis not present

## 2023-11-03 DIAGNOSIS — E119 Type 2 diabetes mellitus without complications: Secondary | ICD-10-CM

## 2023-11-03 DIAGNOSIS — N1831 Chronic kidney disease, stage 3a: Secondary | ICD-10-CM

## 2023-11-03 DIAGNOSIS — I351 Nonrheumatic aortic (valve) insufficiency: Secondary | ICD-10-CM

## 2023-11-03 DIAGNOSIS — E785 Hyperlipidemia, unspecified: Secondary | ICD-10-CM

## 2023-11-03 LAB — ECHOCARDIOGRAM COMPLETE
AR max vel: 1.02 cm2
AV Area VTI: 1.12 cm2
AV Area mean vel: 1.01 cm2
AV Mean grad: 26 mmHg
AV Peak grad: 47.7 mmHg
Ao pk vel: 3.45 m/s
Area-P 1/2: 3.19 cm2
Height: 69.75 in
P 1/2 time: 647 ms
S' Lateral: 2.8 cm
Weight: 2940.8 [oz_av]

## 2023-11-03 NOTE — Patient Instructions (Signed)
 Medication Instructions:  Your physician recommends that you continue on your current medications as directed. Please refer to the Current Medication list given to you today.  *If you need a refill on your cardiac medications before your next appointment, please call your pharmacy*   Lab Work: None If you have labs (blood work) drawn today and your tests are completely normal, you will receive your results only by: MyChart Message (if you have MyChart) OR A paper copy in the mail If you have any lab test that is abnormal or we need to change your treatment, we will call you to review the results.   Testing/Procedures: Your physician has requested that you have an echocardiogram. Echocardiography is a painless test that uses sound waves to create images of your heart. It provides your doctor with information about the size and shape of your heart and how well your heart's chambers and valves are working. This procedure takes approximately one hour. There are no restrictions for this procedure. Please do NOT wear cologne, perfume, aftershave, or lotions (deodorant is allowed). Please arrive 15 minutes prior to your appointment time.  Please note: We ask at that you not bring children with you during ultrasound (echo/ vascular) testing. Due to room size and safety concerns, children are not allowed in the ultrasound rooms during exams. Our front office staff cannot provide observation of children in our lobby area while testing is being conducted. An adult accompanying a patient to their appointment will only be allowed in the ultrasound room at the discretion of the ultrasound technician under special circumstances. We apologize for any inconvenience.    Follow-Up: At [facility], you and your health needs are our priority.  As part of our continuing mission to provide you with exceptional heart care, we have created designated Provider Care Teams.  These Care Teams include your  primary Cardiologist (physician) and Advanced Practice Providers (APPs -  Physician Assistants and Nurse Practitioners) who all work together to provide you with the care you need, when you need it.  We recommend signing up for the patient portal called "MyChart".  Sign up information is provided on this After Visit Summary.  MyChart is used to connect with patients for Virtual Visits (Telemedicine).  Patients are able to view lab/test results, encounter notes, upcoming appointments, etc.  Non-urgent messages can be sent to your provider as well.   To learn more about what you can do with MyChart, go to [email or web address].    Your next appointment:   1 year(s)  Provider:   Norman Herrlich, MD    Other Instructions None

## 2023-11-06 NOTE — Progress Notes (Signed)
 Dr. Dulce Sellar informed the patient of his results.

## 2023-11-13 DIAGNOSIS — E663 Overweight: Secondary | ICD-10-CM | POA: Diagnosis not present

## 2023-11-13 DIAGNOSIS — E785 Hyperlipidemia, unspecified: Secondary | ICD-10-CM | POA: Diagnosis not present

## 2023-11-13 DIAGNOSIS — Z6827 Body mass index (BMI) 27.0-27.9, adult: Secondary | ICD-10-CM | POA: Diagnosis not present

## 2023-11-13 DIAGNOSIS — E1169 Type 2 diabetes mellitus with other specified complication: Secondary | ICD-10-CM | POA: Diagnosis not present

## 2023-11-13 DIAGNOSIS — Z008 Encounter for other general examination: Secondary | ICD-10-CM | POA: Diagnosis not present

## 2023-11-24 ENCOUNTER — Other Ambulatory Visit: Payer: Self-pay | Admitting: Emergency Medicine

## 2023-11-24 DIAGNOSIS — E1159 Type 2 diabetes mellitus with other circulatory complications: Secondary | ICD-10-CM

## 2023-11-28 ENCOUNTER — Telehealth: Payer: Self-pay | Admitting: Emergency Medicine

## 2023-11-28 NOTE — Telephone Encounter (Signed)
 Copied from CRM [id number]. Topic: Clinical - Prescription Issue >> Nov 28, 2023  1:23 PM Sim Boast F wrote: Reason for CRM: Patient requesting generic for Farxiga medication, says the generic starts with a "D" and would like this sent today to [facility] pharmacy on file

## 2023-11-30 ENCOUNTER — Ambulatory Visit (INDEPENDENT_AMBULATORY_CARE_PROVIDER_SITE_OTHER): Admitting: Otolaryngology

## 2023-11-30 ENCOUNTER — Encounter (INDEPENDENT_AMBULATORY_CARE_PROVIDER_SITE_OTHER): Payer: Self-pay | Admitting: Otolaryngology

## 2023-11-30 VITALS — BP 129/76 | HR 96

## 2023-11-30 DIAGNOSIS — R490 Dysphonia: Secondary | ICD-10-CM | POA: Diagnosis not present

## 2023-11-30 DIAGNOSIS — J383 Other diseases of vocal cords: Secondary | ICD-10-CM | POA: Diagnosis not present

## 2023-11-30 DIAGNOSIS — J381 Polyp of vocal cord and larynx: Secondary | ICD-10-CM

## 2023-11-30 NOTE — H&P (View-Only) (Signed)
 ENT Progress Note:   Update 11/30/2023  Discussed the use of AI scribe software for clinical note transcription with the patient, who gave verbal consent to proceed.  History of Present Illness Jacob Chan is a 68 year old male who was seen a few months ago for dysphonia and a Right vocal cord cyst, returns for f/u.   He continues to be raspy hoarse. Saw cardiology for clearance and had an Echo which showed normal EF.  A scope examination when he was seen initially confirmed the presence of a large cyst on the right vocal cord. He had no new illnesses since his last office visit, no admissions or visits to ED.    Records Reviewed:  Initial Evaluation  Reason for Consult: vocal fold lesion and dysphonia for years  HPI: Discussed the use of AI scribe software for clinical note transcription with the patient, who gave verbal consent to proceed.  History of Present Illness   The patient is a 52 yoM, hx of DM and kidney stones, CKD, with a long-standing history of voice changes, presents with a chief complaint of persistent hoarseness. The voice changes began in the early 1990s, and the patient reports that the voice has remained raspy since then, initially sx were intermittent, but lately over the course of the last 2-3 years became constant. The patient reports no pain with talking or swallowing. No dysphagia or dyspnea.   The patient also reports shortness of breath and easy fatigability, but denies any swallowing difficulties. He has noticed an increase in belching and hiccupping after meals, which could potentially indicate gastroesophageal reflux. However, the patient has not been on any medication for reflux or heartburn for a long time.  The patient also reports postnasal drainage and a feeling of congestion, but is not currently using any medication for this. The patient has no history of intubation or surgery on the vocal cords. The only surgical history is a procedure for kidney  stones over ten years ago.  The patient has a history of smoking a pipe for a short duration in his late teens or early twenties, but has not smoked since. The patient denies any known autoimmune conditions.  The patient's voice changes have affected his ability to participate in singing and public speaking, which he used to do regularly. The patient reports that voice rest seems to improve the quality of his voice.  The patient has not had any other significant medical events such as a stroke or heart attack, and reports no known lung conditions.       Records Reviewed:  Office visit with Dr Tomi Likens is a 68 y.o. male kindly referred by Dr. Alvy Bimler for evaluation of dysphonia.  Initial visit (07/2023): Patient reports: throat just generally feels sore, and constant post nasal drip (mucoid). In the morning, it is the worst because his throat seems very dry. Once cleared and drinks water, the voice is much better but he does find his voice is "raspy" most of the day. Never loses his voice completely, and sometimes the voice is completely normal. Voice is not worse with use. This has been going on for "years and years." No antecedent event such as a URI or trauma or intubation. Thinks that drinking water or coffee helps his voice, and he reports that having his throat feel dry makes it worse. He has tried OTC meds (Lozenges, chlorseptic spray) and it all seems to help. He saw an ENT several years and did voice  therapy, which did seem to help a lot. Water: 80 oz a day Coffee: 1 cup per day    Past Medical History:  Diagnosis Date   Diabetes mellitus    Diabetes mellitus without complication (HCC)    [employer] 09/09/2020   GERD (gastroesophageal reflux disease)    Heart murmur    03/20/20: mild MR & mild-moderate AS with mean grad 19 mmHg, peak grad 33.4, AVA VIT 0.80 cm   Hypertension    Kidney stones     Past Surgical History:  Procedure Laterality Date   COLONOSCOPY      LITHOTRIPSY     3 yrs ago    Family History  Problem Relation Age of Onset   Diabetes Mother    Heart disease Mother    Diabetes Father    Colon cancer Neg Hx    Esophageal cancer Neg Hx    Stomach cancer Neg Hx    Rectal cancer Neg Hx     Social History:  reports that he has never smoked. He has never used smokeless tobacco. He reports that he does not drink alcohol and does not use drugs.  Allergies: No Known Allergies  Medications: I have reviewed the patient's current medications.  The PMH, PSH, Medications, Allergies, and SH were reviewed and updated.  ROS: Constitutional: Negative for fever, weight loss and weight gain. Cardiovascular: Negative for chest pain and dyspnea on exertion. Respiratory: Is not experiencing shortness of breath at rest. Gastrointestinal: Negative for nausea and vomiting. Neurological: Negative for headaches. Psychiatric: The patient is not nervous/anxious  Blood pressure 129/76, pulse 96, SpO2 98%.  PHYSICAL EXAM:  Exam: General: Well-developed, well-nourished Communication and Voice: raspy Respiratory Respiratory effort: Equal inspiration and expiration without stridor Cardiovascular Peripheral Vascular: Warm extremities with equal color/perfusion Eyes: No nystagmus with equal extraocular motion bilaterally Neuro/Psych/Balance: Patient oriented to person, place, and time; Appropriate mood and affect; Gait is intact with no imbalance; Cranial nerves I-XII are intact Head and Face Inspection: Normocephalic and atraumatic without mass or lesion Palpation: Facial skeleton intact without bony stepoffs Salivary Glands: No mass or tenderness Facial Strength: Facial motility symmetric and full bilaterally ENT Pinna: External ear intact and fully developed External canal: Canal is patent with intact skin Tympanic Membrane: Clear and mobile External Nose: No scar or anatomic deformity Internal Nose: Septum is deviated to the left. No  polyp, or purulence. Mucosal edema and erythema present.  Bilateral inferior turbinate hypertrophy.  Lips, Teeth, and gums: Mucosa and teeth intact and viable TMJ: No pain to palpation with full mobility Oral cavity/oropharynx: No erythema or exudate, no lesions present Nasopharynx: No mass or lesion with intact mucosa Hypopharynx: Intact mucosa without pooling of secretions Larynx Glottic: Full true vocal cord mobility with a cystic mass along right true VF (nearly entire length), VF atrophy bilaterally, and incomplete glottic closure, mucosal wave decreased on the right 2/2 cyst Supraglottic: Normal appearing epiglottis and AE folds Interarytenoid Space: Moderate pachydermia&edema Subglottic Space: Patent without lesion or edema Neck Neck and Trachea: Midline trachea without mass or lesion Thyroid: No mass or nodularity Lymphatics: No lymphadenopathy  Procedure:  Preoperative diagnosis: hoarseness and R VF mass/lesion  Postoperative diagnosis:   same + GERD LPR + VF atrophy and glottic insufficiency  Procedure: Flexible fiberoptic laryngoscopy  Surgeon: Ashok Croon, MD  Anesthesia: Topical lidocaine and Afrin Complications: None Condition is stable throughout exam  Indications and consent:  The patient presents to the clinic with above symptoms. Indirect laryngoscopy view was incomplete. Thus it  was recommended that they undergo a flexible fiberoptic laryngoscopy. All of the risks, benefits, and potential complications were reviewed with the patient preoperatively and verbal informed consent was obtained.  Procedure: The patient was seated upright in the clinic. Topical lidocaine and Afrin were applied to the nasal cavity. After adequate anesthesia had occurred, I then proceeded to pass the flexible telescope into the nasal cavity. The nasal cavity was patent without rhinorrhea or polyp. The nasopharynx was also patent without mass or lesion. The base of tongue was visualized  and was normal. There were no signs of pooling of secretions in the piriform sinuses. The true vocal folds were mobile bilaterally. There was a large submucosal cyst along near entire length of the right true VF. There was moderate interarytenoid pachydermia and post cricoid edema. The telescope was then slowly withdrawn and the patient tolerated the procedure throughout.   Studies Reviewed:09/14/20 CLINICAL DATA:  Cough and shortness of breath   EXAM: CHEST - 2 VIEW   COMPARISON:  October 20, 2009   FINDINGS: Lungs are clear. Heart size and pulmonary vascularity are normal. No adenopathy. There is slight anterior wedging of a midthoracic vertebral body.   IMPRESSION: Lungs clear.  Cardiac silhouette normal.    Assessment/Plan: Encounter Diagnoses  Name Primary?   Dysphonia Yes   Vocal fold polyp    Age-related vocal fold atrophy    Glottic insufficiency    Lesion of vocal fold      Assessment and Plan    R Vocal Cord Cyst/lesion and hx of gradually worsening chronic dysphonia/raspy voice Chronic hoarseness and voice changes since the early 1990s. Examination revealed a sizable cyst on the right vocal cord, likely benign but causing significant disruption in vocal fold closure and vibration based on strobe exam, and had evidence of VF atrophy on exam as well with findings c/w GERD LPR and post-nasal drainage. Discussed surgical removal and vocal fold injection augmentation to improve closure and reduce strain. Explained procedure involves general anesthesia, intubation, and no external incisions. Anticipated outcome is improved voice quality.  - risks and benefits of surgery discussed and he would like to proceed - Schedule for DML, CO2 laser excision of right VF polyp and b/l VF injection augmentation - Prescribe Pepcid (famotidine) 20 mg BID for reflux management - Prescribe Flonase 2 puffs b/l nares BID  for nasal drainage - Advise complete voice rest for 2-3 days  post-surgery, followed by gradual increase in voice use with glides - Coordinate with surgery schedulers for a Friday appointment   Chronic Nasal Congestion Chronic postnasal drainage and stuffy nose. Examination revealed nasal congestion and a deviated septum. - Prescribe Flonase for nasal drainage - will consider systemic antihistamine post-op if warranted and no improvement in the future   GERD LPR Belching and hiccups after eating, and findings on scope exam today c/w GERD LPR. No recent history of medication for reflux. - Prescribe Pepcid (famotidine) 20 mg BID for reflux management  General Health Maintenance No history of stroke, myocardial infarction, or significant pulmonary issues. - Advised to inform primary care physician about upcoming surgery and ensure surgical clearance if needed  Follow-up - Provide an excuse note for today's visit - Coordinate with surgery schedulers for a Friday appointment - Advise to follow up with primary care physician for surgical clearance.     Update 11/30/2023 Assessment and Plan  From cardiology visit 11/03/23  From a preoperative perspective he has aortic valve disease asymptomatic but the severity is in question Her  to be able to do an echocardiogram today unless it is severe I would proceed with his planned surgery and he will need yearly follow-up in cardiology I will review that echocardiogram before he leaves my office His blood pressure is well-controlled continue his ACE inhibitor Stable diabetes last A1c 7.3 Stable CKD his last creatinine 1.26 Stable statin indicated with aortic valve disease   I reviewed his echocardiogram live in the office as we did his mean gradients in the range of 30 mmHg normal left ventricular function moderate aortic stenosis asymptomatic proceed with his planned ENT surgery.  He is optimized he requires no further cardiac evaluation or intervention Assessment & Plan Vocal cord cyst R VF and chronic  dysphonia  A large submucosal cyst on the right vocal cord in the setting of chronic dysphonia. Surgical intervention is necessary. - Schedule surgery as planned (DML and excision microflap technique vs CO2 laser excision) - had a visit with Cardiology and Echo - cleared by Cardiology - Advise on postoperative pain management with acetaminophen or ibuprofen. - Recommend soft foods postoperatively, with gradual return to regular diet. - Inform of expected recovery time of two to three weeks for voice improvement.  Vocal cord atrophy on exam Thinning of the vocal cords contributes to difficulty in voice projection and closure. - vocal fold injection augmentation during surgery   Follow-up Coordination with surgery schedulers is necessary to set a date for the procedure, considering his work schedule and personal commitments. - Plan for limited work duties post-surgery, with a return to work by the following Monday if surgery is on Wednesday.     Ashok Croon, MD Otolaryngology [facility] Phone: [phone] Fax: [phone]    11/30/2023, 5:59 PM

## 2023-11-30 NOTE — Progress Notes (Signed)
 ENT Progress Note:   Update 11/30/2023  Discussed the use of AI scribe software for clinical note transcription with the patient, who gave verbal consent to proceed.  History of Present Illness Jacob Chan is a 68 year old male who was seen a few months ago for dysphonia and a Right vocal cord cyst, returns for f/u.   He continues to be raspy hoarse. Saw cardiology for clearance and had an Echo which showed normal EF.  A scope examination when he was seen initially confirmed the presence of a large cyst on the right vocal cord. He had no new illnesses since his last office visit, no admissions or visits to ED.    Records Reviewed:  Initial Evaluation  Reason for Consult: vocal fold lesion and dysphonia for years  HPI: Discussed the use of AI scribe software for clinical note transcription with the patient, who gave verbal consent to proceed.  History of Present Illness   The patient is a 52 yoM, hx of DM and kidney stones, CKD, with a long-standing history of voice changes, presents with a chief complaint of persistent hoarseness. The voice changes began in the early 1990s, and the patient reports that the voice has remained raspy since then, initially sx were intermittent, but lately over the course of the last 2-3 years became constant. The patient reports no pain with talking or swallowing. No dysphagia or dyspnea.   The patient also reports shortness of breath and easy fatigability, but denies any swallowing difficulties. He has noticed an increase in belching and hiccupping after meals, which could potentially indicate gastroesophageal reflux. However, the patient has not been on any medication for reflux or heartburn for a long time.  The patient also reports postnasal drainage and a feeling of congestion, but is not currently using any medication for this. The patient has no history of intubation or surgery on the vocal cords. The only surgical history is a procedure for kidney  stones over ten years ago.  The patient has a history of smoking a pipe for a short duration in his late teens or early twenties, but has not smoked since. The patient denies any known autoimmune conditions.  The patient's voice changes have affected his ability to participate in singing and public speaking, which he used to do regularly. The patient reports that voice rest seems to improve the quality of his voice.  The patient has not had any other significant medical events such as a stroke or heart attack, and reports no known lung conditions.       Records Reviewed:  Office visit with Dr Tomi Likens is a 68 y.o. male kindly referred by Dr. Alvy Bimler for evaluation of dysphonia.  Initial visit (07/2023): Patient reports: throat just generally feels sore, and constant post nasal drip (mucoid). In the morning, it is the worst because his throat seems very dry. Once cleared and drinks water, the voice is much better but he does find his voice is "raspy" most of the day. Never loses his voice completely, and sometimes the voice is completely normal. Voice is not worse with use. This has been going on for "years and years." No antecedent event such as a URI or trauma or intubation. Thinks that drinking water or coffee helps his voice, and he reports that having his throat feel dry makes it worse. He has tried OTC meds (Lozenges, chlorseptic spray) and it all seems to help. He saw an ENT several years and did voice  therapy, which did seem to help a lot. Water: 80 oz a day Coffee: 1 cup per day    Past Medical History:  Diagnosis Date   Diabetes mellitus    Diabetes mellitus without complication (HCC)    Phreesia 09/09/2020   GERD (gastroesophageal reflux disease)    Heart murmur    03/20/20: mild MR & mild-moderate AS with mean grad 19 mmHg, peak grad 33.4, AVA VIT 0.80 cm   Hypertension    Kidney stones     Past Surgical History:  Procedure Laterality Date   COLONOSCOPY      LITHOTRIPSY     3 yrs ago    Family History  Problem Relation Age of Onset   Diabetes Mother    Heart disease Mother    Diabetes Father    Colon cancer Neg Hx    Esophageal cancer Neg Hx    Stomach cancer Neg Hx    Rectal cancer Neg Hx     Social History:  reports that he has never smoked. He has never used smokeless tobacco. He reports that he does not drink alcohol and does not use drugs.  Allergies: No Known Allergies  Medications: I have reviewed the patient's current medications.  The PMH, PSH, Medications, Allergies, and SH were reviewed and updated.  ROS: Constitutional: Negative for fever, weight loss and weight gain. Cardiovascular: Negative for chest pain and dyspnea on exertion. Respiratory: Is not experiencing shortness of breath at rest. Gastrointestinal: Negative for nausea and vomiting. Neurological: Negative for headaches. Psychiatric: The patient is not nervous/anxious  Blood pressure 129/76, pulse 96, SpO2 98%.  PHYSICAL EXAM:  Exam: General: Well-developed, well-nourished Communication and Voice: raspy Respiratory Respiratory effort: Equal inspiration and expiration without stridor Cardiovascular Peripheral Vascular: Warm extremities with equal color/perfusion Eyes: No nystagmus with equal extraocular motion bilaterally Neuro/Psych/Balance: Patient oriented to person, place, and time; Appropriate mood and affect; Gait is intact with no imbalance; Cranial nerves I-XII are intact Head and Face Inspection: Normocephalic and atraumatic without mass or lesion Palpation: Facial skeleton intact without bony stepoffs Salivary Glands: No mass or tenderness Facial Strength: Facial motility symmetric and full bilaterally ENT Pinna: External ear intact and fully developed External canal: Canal is patent with intact skin Tympanic Membrane: Clear and mobile External Nose: No scar or anatomic deformity Internal Nose: Septum is deviated to the left. No  polyp, or purulence. Mucosal edema and erythema present.  Bilateral inferior turbinate hypertrophy.  Lips, Teeth, and gums: Mucosa and teeth intact and viable TMJ: No pain to palpation with full mobility Oral cavity/oropharynx: No erythema or exudate, no lesions present Nasopharynx: No mass or lesion with intact mucosa Hypopharynx: Intact mucosa without pooling of secretions Larynx Glottic: Full true vocal cord mobility with a cystic mass along right true VF (nearly entire length), VF atrophy bilaterally, and incomplete glottic closure, mucosal wave decreased on the right 2/2 cyst Supraglottic: Normal appearing epiglottis and AE folds Interarytenoid Space: Moderate pachydermia&edema Subglottic Space: Patent without lesion or edema Neck Neck and Trachea: Midline trachea without mass or lesion Thyroid: No mass or nodularity Lymphatics: No lymphadenopathy  Procedure:  Preoperative diagnosis: hoarseness and R VF mass/lesion  Postoperative diagnosis:   same + GERD LPR + VF atrophy and glottic insufficiency  Procedure: Flexible fiberoptic laryngoscopy  Surgeon: Ashok Croon, MD  Anesthesia: Topical lidocaine and Afrin Complications: None Condition is stable throughout exam  Indications and consent:  The patient presents to the clinic with above symptoms. Indirect laryngoscopy view was incomplete. Thus it  was recommended that they undergo a flexible fiberoptic laryngoscopy. All of the risks, benefits, and potential complications were reviewed with the patient preoperatively and verbal informed consent was obtained.  Procedure: The patient was seated upright in the clinic. Topical lidocaine and Afrin were applied to the nasal cavity. After adequate anesthesia had occurred, I then proceeded to pass the flexible telescope into the nasal cavity. The nasal cavity was patent without rhinorrhea or polyp. The nasopharynx was also patent without mass or lesion. The base of tongue was visualized  and was normal. There were no signs of pooling of secretions in the piriform sinuses. The true vocal folds were mobile bilaterally. There was a large submucosal cyst along near entire length of the right true VF. There was moderate interarytenoid pachydermia and post cricoid edema. The telescope was then slowly withdrawn and the patient tolerated the procedure throughout.   Studies Reviewed:09/14/20 CLINICAL DATA:  Cough and shortness of breath   EXAM: CHEST - 2 VIEW   COMPARISON:  October 20, 2009   FINDINGS: Lungs are clear. Heart size and pulmonary vascularity are normal. No adenopathy. There is slight anterior wedging of a midthoracic vertebral body.   IMPRESSION: Lungs clear.  Cardiac silhouette normal.    Assessment/Plan: Encounter Diagnoses  Name Primary?   Dysphonia Yes   Vocal fold polyp    Age-related vocal fold atrophy    Glottic insufficiency    Lesion of vocal fold      Assessment and Plan    R Vocal Cord Cyst/lesion and hx of gradually worsening chronic dysphonia/raspy voice Chronic hoarseness and voice changes since the early 1990s. Examination revealed a sizable cyst on the right vocal cord, likely benign but causing significant disruption in vocal fold closure and vibration based on strobe exam, and had evidence of VF atrophy on exam as well with findings c/w GERD LPR and post-nasal drainage. Discussed surgical removal and vocal fold injection augmentation to improve closure and reduce strain. Explained procedure involves general anesthesia, intubation, and no external incisions. Anticipated outcome is improved voice quality.  - risks and benefits of surgery discussed and he would like to proceed - Schedule for DML, CO2 laser excision of right VF polyp and b/l VF injection augmentation - Prescribe Pepcid (famotidine) 20 mg BID for reflux management - Prescribe Flonase 2 puffs b/l nares BID  for nasal drainage - Advise complete voice rest for 2-3 days  post-surgery, followed by gradual increase in voice use with glides - Coordinate with surgery schedulers for a Friday appointment   Chronic Nasal Congestion Chronic postnasal drainage and stuffy nose. Examination revealed nasal congestion and a deviated septum. - Prescribe Flonase for nasal drainage - will consider systemic antihistamine post-op if warranted and no improvement in the future   GERD LPR Belching and hiccups after eating, and findings on scope exam today c/w GERD LPR. No recent history of medication for reflux. - Prescribe Pepcid (famotidine) 20 mg BID for reflux management  General Health Maintenance No history of stroke, myocardial infarction, or significant pulmonary issues. - Advised to inform primary care physician about upcoming surgery and ensure surgical clearance if needed  Follow-up - Provide an excuse note for today's visit - Coordinate with surgery schedulers for a Friday appointment - Advise to follow up with primary care physician for surgical clearance.     Update 11/30/2023 Assessment and Plan  From cardiology visit 11/03/23  From a preoperative perspective he has aortic valve disease asymptomatic but the severity is in question Her  to be able to do an echocardiogram today unless it is severe I would proceed with his planned surgery and he will need yearly follow-up in cardiology I will review that echocardiogram before he leaves my office His blood pressure is well-controlled continue his ACE inhibitor Stable diabetes last A1c 7.3 Stable CKD his last creatinine 1.26 Stable statin indicated with aortic valve disease   I reviewed his echocardiogram live in the office as we did his mean gradients in the range of 30 mmHg normal left ventricular function moderate aortic stenosis asymptomatic proceed with his planned ENT surgery.  He is optimized he requires no further cardiac evaluation or intervention Assessment & Plan Vocal cord cyst R VF and chronic  dysphonia  A large submucosal cyst on the right vocal cord in the setting of chronic dysphonia. Surgical intervention is necessary. - Schedule surgery as planned (DML and excision microflap technique vs CO2 laser excision) - had a visit with Cardiology and Echo - cleared by Cardiology - Advise on postoperative pain management with acetaminophen or ibuprofen. - Recommend soft foods postoperatively, with gradual return to regular diet. - Inform of expected recovery time of two to three weeks for voice improvement.  Vocal cord atrophy on exam Thinning of the vocal cords contributes to difficulty in voice projection and closure. - vocal fold injection augmentation during surgery   Follow-up Coordination with surgery schedulers is necessary to set a date for the procedure, considering his work schedule and personal commitments. - Plan for limited work duties post-surgery, with a return to work by the following Monday if surgery is on Wednesday.     Ashok Croon, MD Otolaryngology [facility] Phone: [phone] Fax: [phone]    11/30/2023, 5:59 PM

## 2023-12-01 ENCOUNTER — Encounter (INDEPENDENT_AMBULATORY_CARE_PROVIDER_SITE_OTHER): Payer: Self-pay | Admitting: Otolaryngology

## 2023-12-04 ENCOUNTER — Other Ambulatory Visit: Payer: Self-pay | Admitting: Radiology

## 2023-12-04 DIAGNOSIS — I152 Hypertension secondary to endocrine disorders: Secondary | ICD-10-CM

## 2023-12-04 DIAGNOSIS — N1831 Chronic kidney disease, stage 3a: Secondary | ICD-10-CM

## 2023-12-04 MED ORDER — DAPAGLIFLOZIN PROPANEDIOL 10 MG PO TABS: 10.0000 mg | ORAL_TABLET | Freq: Every day | ORAL | 3 refills | Status: DC

## 2023-12-18 ENCOUNTER — Encounter (HOSPITAL_COMMUNITY): Payer: Self-pay | Admitting: *Deleted

## 2023-12-18 ENCOUNTER — Other Ambulatory Visit: Payer: Self-pay

## 2023-12-18 NOTE — Progress Notes (Signed)
 SDW call   Patient was given pre-op instructions over the phone. Patient verbalized understanding of instructions provided.     PCP - Dr. Maryagnes Small Cardiologist - Dr. Sandee Crook   PPM/ICD - denies Device Orders - na Rep Notified - na   Chest x-ray - na EKG -  DOS, 11/03/23 Stress Test - ECHO - 11/03/23 Cardiac Cath -    Sleep Study/sleep apnea/CPAP: denies   Type II diabetic Fasting Blood sugar range: less than 100's How often check sugars: daily   Blood Thinner Instructions: denies Aspirin Instructions: states LD 12/17/23   ERAS Protcol - Clears until 0630   Anesthesia review: Yes.  HTN, DM, heart murmur   Patient denies shortness of breath, fever, cough and chest pain over the phone call

## 2023-12-19 NOTE — Anesthesia Preprocedure Evaluation (Signed)
 Anesthesia Evaluation  Patient identified by MRN, date of birth, ID band Patient awake    Reviewed: Allergy & Precautions, H&P , NPO status , Patient's Chart, lab work & pertinent test results  Airway Mallampati: III  TM Distance: >3 FB Neck ROM: Full    Dental  (+) Teeth Intact, Dental Advisory Given   Pulmonary  07/2023 flexible fiberoptic laryngoscopy on showed true vocal cords mobile and atrophic, medial edges were bowed and with lesion along right true VF, closure was incomplete, periodicity present, mucosal wave and amplitude were decreased on the right side 2/2 cystic mass, there is moderate interarytenoid pachydermia and post cricoid edema.    Pulmonary exam normal breath sounds clear to auscultation       Cardiovascular hypertension, Pt. on medications Normal cardiovascular exam+ Valvular Problems/Murmurs (mod AS) AS  Rhythm:Regular Rate:Normal  Echo 11/03/23  1. Left ventricular ejection fraction, by estimation, is 60 to 65%. The  left ventricle has normal function. The left ventricle has no regional  wall motion abnormalities. Left ventricular diastolic parameters are  consistent with Grade I diastolic  dysfunction (impaired relaxation). The average left ventricular global  longitudinal strain is -10.2 %. The global longitudinal strain is normal.  3D not performed.   2. Right ventricular systolic function is normal. The right ventricular  size is normal.   3. The mitral valve is normal in structure. No evidence of mitral valve  regurgitation. No evidence of mitral stenosis.   4. The aortic valve was not well visualized. Aortic valve regurgitation  is trivial. Moderate aortic valve stenosis.   5. The inferior vena cava is normal in size with greater than 50%  respiratory variability, suggesting right atrial pressure of 3 mmHg.     Neuro/Psych negative neurological ROS  negative psych ROS   GI/Hepatic Neg liver  ROS,GERD  Controlled,,  Endo/Other  diabetes, Well Controlled, Type 2, Oral Hypoglycemic Agents  A1c 7.1  Renal/GU negative Renal ROS  negative genitourinary   Musculoskeletal negative musculoskeletal ROS (+)    Abdominal   Peds negative pediatric ROS (+)  Hematology negative hematology ROS (+)   Anesthesia Other Findings   Reproductive/Obstetrics negative OB ROS                             Anesthesia Physical Anesthesia Plan  ASA: 3  Anesthesia Plan: General   Post-op Pain Management: Tylenol  PO (pre-op)*   Induction: Intravenous  PONV Risk Score and Plan: 2 and Ondansetron , Dexamethasone , Midazolam  and Treatment may vary due to age or medical condition  Airway Management Planned: Oral ETT  Additional Equipment: None  Intra-op Plan:   Post-operative Plan: Extubation in OR  Informed Consent: I have reviewed the patients History and Physical, chart, labs and discussed the procedure including the risks, benefits and alternatives for the proposed anesthesia with the patient or authorized representative who has indicated his/her understanding and acceptance.     Dental advisory given  Plan Discussed with: CRNA  Anesthesia Plan Comments:        Anesthesia Quick Evaluation

## 2023-12-19 NOTE — Progress Notes (Signed)
 Anesthesia Chart Review:  Case: [id number] Date/Time: 12/20/23 0915   Procedures:      MICROLARYNGOSCOPY, WITH PROCEDURE USING LASER - DML bronchoscopy and CO2 laser excision of the right vocal fold lesion, possible vocal fold injection augmentation     BRONCHOSCOPY, RIGID     MICROLARYNGOSCOPY, WITH VOCAL CORD INJECTION   Anesthesia type: Choice   Diagnosis:      Vocal fold polyp [J38.1]     Dysphonia [R49.0]   Pre-op diagnosis: Vocal Fold Polyp , Dysphonia   Location: [facility] ROOM 05 / [facility]   Surgeons: Artice Last, MD       DISCUSSION: Patient is a 68 year old male scheduled for the above procedure. Procedure planned for chronic hoarseness/dysphonia. Surgery was initially scheduled for 09/29/23, but it was delayed until his AS could be re-evaluated by cardiology. Since then he had office visit and updated echo on 11/03/23 . Echo showed moderate AS, asymptomatic. Per Dr. Zoe Hinds, "I reviewed his echocardiogram live in the office as we did his mean gradients in the range of 30 mmHg normal left ventricular function moderate aortic stenosis asymptomatic proceed with his planned ENT surgery. He is optimized he requires no further cardiac evaluation or intervention."  History includes never smoker, HTN, DM2, CKD (3a), dyslipidemia, GERD, murmur (moderate AS 10/2023).   07/2023 flexible fiberoptic laryngoscopy on showed true vocal cords mobile and atrophic, medial edges were bowed and with lesion along right true VF, closure was incomplete, periodicity present, mucosal wave and amplitude were decreased on the right side 2/2 cystic mass, there is moderate interarytenoid pachydermia and post cricoid edema.    He had cardiology evaluation by Jackquelyn Mass, MD on 02/28/20 for murmur evaluation with echo showing mild MR, mild-moderate AS. As above, he had more recent evaluation by Zoe Hinds, MD on 11/03/23 to re-evalute AS and for preoperative cardiology evaluation. AS classified as moderate. One  year office follow-up planned.   A1c 7.1% on 08/16/23. Not currently on Farxiga . He is on metformin  500 mg daily and glipizide  5 mg daily.    Last ASA 12/17/23. Anesthesia team to evaluate on the day of surgery.   VS:  Wt Readings from Last 3 Encounters:  11/03/23 83.4 kg  10/09/23 83 kg  08/16/23 82.6 kg   BP Readings from Last 3 Encounters:  11/30/23 129/76  11/03/23 110/74  10/09/23 134/88   Pulse Readings from Last 3 Encounters:  11/30/23 96  11/03/23 86  10/09/23 87     PROVIDERS: Elvira Hammersmith, MD is PCP  Milon Aloe, MD & Artice Last, MD are his ENT providers Zoe Hinds, MD is cardiologist. One year follow-up in the [place] office planned following 11/03/23 visit.     LABS: Most recent lab results in [facility] include Lab Results  Component Value Date   WBC 5.0 02/20/2023   HGB 13.5 02/20/2023   HCT 41.9 02/20/2023   PLT 198.0 02/20/2023   GLUCOSE 65 (L) 02/20/2023   CHOL 138 02/20/2023   TRIG 93.0 02/20/2023   HDL 52.70 02/20/2023   LDLCALC 67 02/20/2023   ALT 16 02/20/2023   AST 21 02/20/2023   NA 139 02/20/2023   K 3.7 02/20/2023   CL 108 02/20/2023   CREATININE 1.26 02/20/2023   BUN 18 02/20/2023   CO2 25 02/20/2023   PSA 0.99 08/16/2023   HGBA1C 7.1 08/16/2023   MICROALBUR 4.7 (H) 08/17/2022     EKG: 11/03/23: Normal sinus rhythm Inferior infarct , age undetermined Cannot rule  out Anterior infarct , age undetermined Abnormal ECG   CV: Echo 11/03/23: IMPRESSIONS   1. Left ventricular ejection fraction, by estimation, is 60 to 65%. The  left ventricle has normal function. The left ventricle has no regional  wall motion abnormalities. Left ventricular diastolic parameters are  consistent with Grade I diastolic  dysfunction (impaired relaxation). The average left ventricular global  longitudinal strain is -10.2 %. The global longitudinal strain is normal.  3D not performed.   2. Right ventricular systolic function is normal.  The right ventricular  size is normal.   3. The mitral valve is normal in structure. No evidence of mitral valve  regurgitation. No evidence of mitral stenosis.   4. The aortic valve was not well visualized. Aortic valve regurgitation  is trivial. Moderate aortic valve stenosis. Aortic regurgitation PHT measures 647 msec.  Moderate aortic stenosis is present. Aortic valve mean gradient measures  26.0 mmHg. Aortic valve peak gradient  measures 47.7 mmHg. Aortic valve area, by VTI measures 1.12 cm.   5. The inferior vena cava is normal in size with greater than 50%  respiratory variability, suggesting right atrial pressure of 3 mmHg.  - Comparison TTE 03/20/20: LVEF 60-65%, no RWMA, mild concentric LVH, grade 1 DD, normal RVSF, normal PASP, RVSP 27.4 mmHg, mild MR, severe AV calcification, trivial AI, mild-moderate AS (AV mean gradient 19.0  mmHg; AV peak gradient 33.4 mmHg, AVA VTI measures 0.80 cm)    Past Medical History:  Diagnosis Date   Diabetes mellitus    Diabetes mellitus without complication (HCC)    Phreesia 09/09/2020   GERD (gastroesophageal reflux disease)    Heart murmur    03/20/20: mild MR & mild-moderate AS with mean grad 19 mmHg, peak grad 33.4, AVA VIT 0.80 cm   Hypertension    Kidney stones     Past Surgical History:  Procedure Laterality Date   COLONOSCOPY     LITHOTRIPSY     3 yrs ago    MEDICATIONS: No current facility-administered medications for this encounter.    acetaminophen  (TYLENOL ) 500 MG tablet   amLODipine  (NORVASC ) 5 MG tablet   Ascorbic Acid (VITAMIN C WITH ROSE HIPS PO)   aspirin EC 81 MG tablet   diclofenac  sodium (VOLTAREN ) 1 % GEL   glipiZIDE  (GLUCOTROL ) 5 MG tablet   lisinopril  (ZESTRIL ) 20 MG tablet   metFORMIN  (GLUCOPHAGE -XR) 500 MG 24 hr tablet   Multiple Vitamins-Minerals (CENTRUM SILVER PO)   rosuvastatin  (CRESTOR ) 5 MG tablet   tadalafil  (CIALIS ) 20 MG tablet   dapagliflozin  propanediol (FARXIGA ) 10 MG TABS tablet    fluticasone  (FLONASE ) 50 MCG/ACT nasal spray    Ella Gun, PA-C Surgical Short Stay/Anesthesiology [facility] Phone [phone] [facility] Phone [phone] 12/19/2023 1:53 PM

## 2023-12-20 ENCOUNTER — Ambulatory Visit (HOSPITAL_BASED_OUTPATIENT_CLINIC_OR_DEPARTMENT_OTHER): Admitting: Vascular Surgery

## 2023-12-20 ENCOUNTER — Ambulatory Visit (HOSPITAL_COMMUNITY)
Admission: RE | Admit: 2023-12-20 | Discharge: 2023-12-20 | Disposition: A | Discharge status: Home / Self Care | Attending: Otolaryngology | Admitting: Otolaryngology

## 2023-12-20 ENCOUNTER — Encounter (HOSPITAL_COMMUNITY): Payer: Self-pay

## 2023-12-20 ENCOUNTER — Ambulatory Visit (HOSPITAL_COMMUNITY): Admitting: Vascular Surgery

## 2023-12-20 ENCOUNTER — Encounter (HOSPITAL_COMMUNITY)
Admission: RE | Disposition: A | Discharge status: Home / Self Care | Payer: Self-pay | Source: Home / Self Care | Attending: Otolaryngology

## 2023-12-20 DIAGNOSIS — Z7984 Long term (current) use of oral hypoglycemic drugs: Secondary | ICD-10-CM

## 2023-12-20 DIAGNOSIS — K219 Gastro-esophageal reflux disease without esophagitis: Secondary | ICD-10-CM | POA: Insufficient documentation

## 2023-12-20 DIAGNOSIS — E119 Type 2 diabetes mellitus without complications: Secondary | ICD-10-CM | POA: Diagnosis not present

## 2023-12-20 DIAGNOSIS — I10 Essential (primary) hypertension: Secondary | ICD-10-CM | POA: Insufficient documentation

## 2023-12-20 DIAGNOSIS — J383 Other diseases of vocal cords: Secondary | ICD-10-CM

## 2023-12-20 DIAGNOSIS — R490 Dysphonia: Secondary | ICD-10-CM | POA: Diagnosis present

## 2023-12-20 DIAGNOSIS — J381 Polyp of vocal cord and larynx: Secondary | ICD-10-CM | POA: Insufficient documentation

## 2023-12-20 HISTORY — PX: RIGID BRONCHOSCOPY: SHX5069

## 2023-12-20 HISTORY — PX: MICROLARYNGOSCOPY W/VOCAL CORD INJECTION: SHX2665

## 2023-12-20 LAB — BASIC METABOLIC PANEL WITH GFR
Anion gap: 13 (ref 5–15)
BUN: 22 mg/dL (ref 8–23)
CO2: 18 mmol/L — ABNORMAL LOW (ref 22–32)
Calcium: 9.1 mg/dL (ref 8.9–10.3)
Chloride: 108 mmol/L (ref 98–111)
Creatinine, Ser: 1.31 mg/dL — ABNORMAL HIGH (ref 0.61–1.24)
GFR, Estimated: 59 mL/min — ABNORMAL LOW (ref >60)
Glucose, Bld: 159 mg/dL — ABNORMAL HIGH (ref 70–99)
Potassium: 4.3 mmol/L (ref 3.5–5.1)
Sodium: 139 mmol/L (ref 135–145)

## 2023-12-20 LAB — CBC
HCT: 44.2 % (ref 39.0–52.0)
Hemoglobin: 13.9 g/dL (ref 13.0–17.0)
MCH: 30.4 pg (ref 26.0–34.0)
MCHC: 31.4 g/dL (ref 30.0–36.0)
MCV: 96.7 fL (ref 80.0–100.0)
Platelets: 176 10*3/uL (ref 150–400)
RBC: 4.57 MIL/uL (ref 4.22–5.81)
RDW: 12.8 % (ref 11.5–15.5)
WBC: 4.7 10*3/uL (ref 4.0–10.5)
nRBC: 0 % (ref 0.0–0.2)

## 2023-12-20 LAB — GLUCOSE, CAPILLARY
Glucose-Capillary: 157 mg/dL — ABNORMAL HIGH (ref 70–99)
Glucose-Capillary: 138 mg/dL — ABNORMAL HIGH (ref 70–99)

## 2023-12-20 SURGERY — MICROLARYNGOSCOPY, WITH VOCAL CORD INJECTION
Anesthesia: General | Site: Throat

## 2023-12-20 MED ORDER — ESMOLOL HCL 100 MG/10ML IV SOLN
INTRAVENOUS | Status: DC | PRN
  Administered 2023-12-20: 10 ug via INTRAVENOUS

## 2023-12-20 MED ORDER — SUCCINYLCHOLINE CHLORIDE 200 MG/10ML IV SOSY
PREFILLED_SYRINGE | INTRAVENOUS | Status: DC | PRN
  Administered 2023-12-20: 120 mg via INTRAVENOUS

## 2023-12-20 MED ORDER — DEXAMETHASONE SODIUM PHOSPHATE 10 MG/ML IJ SOLN
INTRAMUSCULAR | Status: AC
  Filled 2023-12-20: qty 1

## 2023-12-20 MED ORDER — MIDAZOLAM HCL 2 MG/2ML IJ SOLN
INTRAMUSCULAR | Status: DC | PRN
  Administered 2023-12-20: 2 mg via INTRAVENOUS

## 2023-12-20 MED ORDER — ACETAMINOPHEN 500 MG PO TABS
1000.0000 mg | ORAL_TABLET | Freq: Once | ORAL | Status: AC
  Administered 2023-12-20: 500 mg via ORAL
  Filled 2023-12-20: qty 2

## 2023-12-20 MED ORDER — AMISULPRIDE (ANTIEMETIC) 5 MG/2ML IV SOLN: 10.0000 mg | Freq: Once | INTRAVENOUS | Status: DC | PRN

## 2023-12-20 MED ORDER — PROPOFOL 10 MG/ML IV BOLUS
INTRAVENOUS | Status: AC
  Filled 2023-12-20: qty 20

## 2023-12-20 MED ORDER — ORAL CARE MOUTH RINSE: 15.0000 mL | Freq: Once | OROMUCOSAL | Status: AC

## 2023-12-20 MED ORDER — OXYCODONE HCL 5 MG/5ML PO SOLN: 5.0000 mg | Freq: Once | ORAL | Status: DC | PRN

## 2023-12-20 MED ORDER — INSULIN ASPART 100 UNIT/ML IJ SOLN
0.0000 [IU] | INTRAMUSCULAR | Status: DC | PRN
  Administered 2023-12-20: 2 [IU] via SUBCUTANEOUS
  Filled 2023-12-20: qty 1

## 2023-12-20 MED ORDER — SUGAMMADEX SODIUM 200 MG/2ML IV SOLN
INTRAVENOUS | Status: DC | PRN
  Administered 2023-12-20 (×2): 100 mg via INTRAVENOUS

## 2023-12-20 MED ORDER — FENTANYL CITRATE (PF) 100 MCG/2ML IJ SOLN: 25.0000 ug | INTRAMUSCULAR | Status: DC | PRN

## 2023-12-20 MED ORDER — OXYCODONE HCL 5 MG PO TABS: 5.0000 mg | ORAL_TABLET | Freq: Once | ORAL | Status: DC | PRN

## 2023-12-20 MED ORDER — PHENYLEPHRINE HCL-NACL 20-0.9 MG/250ML-% IV SOLN
INTRAVENOUS | Status: DC | PRN
  Administered 2023-12-20: 25 ug/min via INTRAVENOUS

## 2023-12-20 MED ORDER — MIDAZOLAM HCL 2 MG/2ML IJ SOLN
INTRAMUSCULAR | Status: AC
  Filled 2023-12-20: qty 2

## 2023-12-20 MED ORDER — LIDOCAINE-EPINEPHRINE 1 %-1:100000 IJ SOLN
INTRAMUSCULAR | Status: DC | PRN
  Administered 2023-12-20: .3 mL

## 2023-12-20 MED ORDER — ESMOLOL HCL 100 MG/10ML IV SOLN
INTRAVENOUS | Status: AC
  Filled 2023-12-20: qty 10

## 2023-12-20 MED ORDER — FENTANYL CITRATE (PF) 250 MCG/5ML IJ SOLN
INTRAMUSCULAR | Status: DC | PRN
Start: 2023-12-20 — End: 2023-12-20
  Administered 2023-12-20: 100 ug via INTRAVENOUS
  Administered 2023-12-20: 50 ug via INTRAVENOUS

## 2023-12-20 MED ORDER — LACTATED RINGERS IV SOLN: INTRAVENOUS | Status: DC

## 2023-12-20 MED ORDER — PHENYLEPHRINE 80 MCG/ML (10ML) SYRINGE FOR IV PUSH (FOR BLOOD PRESSURE SUPPORT)
PREFILLED_SYRINGE | INTRAVENOUS | Status: DC | PRN
  Administered 2023-12-20 (×2): 80 ug via INTRAVENOUS
  Administered 2023-12-20: 160 ug via INTRAVENOUS

## 2023-12-20 MED ORDER — OXYMETAZOLINE HCL 0.05 % NA SOLN
NASAL | Status: DC | PRN
  Administered 2023-12-20: 1 via TOPICAL

## 2023-12-20 MED ORDER — FAMOTIDINE 20 MG PO TABS
20.0000 mg | ORAL_TABLET | Freq: Two times a day (BID) | ORAL | 1 refills | Status: DC
Start: 2023-12-20 — End: 2023-12-20

## 2023-12-20 MED ORDER — CHLORHEXIDINE GLUCONATE 0.12 % MT SOLN
15.0000 mL | Freq: Once | OROMUCOSAL | Status: AC
  Administered 2023-12-20: 15 mL via OROMUCOSAL
  Filled 2023-12-20: qty 15

## 2023-12-20 MED ORDER — ONDANSETRON HCL 4 MG/2ML IJ SOLN
INTRAMUSCULAR | Status: AC
  Filled 2023-12-20: qty 2

## 2023-12-20 MED ORDER — ONDANSETRON HCL 4 MG/2ML IJ SOLN
INTRAMUSCULAR | Status: DC | PRN
  Administered 2023-12-20: 4 mg via INTRAVENOUS

## 2023-12-20 MED ORDER — OXYMETAZOLINE HCL 0.05 % NA SOLN
NASAL | Status: AC
  Filled 2023-12-20: qty 30

## 2023-12-20 MED ORDER — LIDOCAINE 2% (20 MG/ML) 5 ML SYRINGE
INTRAMUSCULAR | Status: DC | PRN
  Administered 2023-12-20: 60 mg via INTRAVENOUS

## 2023-12-20 MED ORDER — 0.9 % SODIUM CHLORIDE (POUR BTL) OPTIME
TOPICAL | Status: DC | PRN
  Administered 2023-12-20: 1000 mL

## 2023-12-20 MED ORDER — FAMOTIDINE 20 MG PO TABS: 20.0000 mg | ORAL_TABLET | Freq: Two times a day (BID) | ORAL | 1 refills | Status: DC

## 2023-12-20 MED ORDER — PROPOFOL 10 MG/ML IV BOLUS
INTRAVENOUS | Status: DC | PRN
Start: 2023-12-20 — End: 2023-12-20
  Administered 2023-12-20: 150 mg via INTRAVENOUS

## 2023-12-20 MED ORDER — ROCURONIUM BROMIDE 10 MG/ML (PF) SYRINGE
PREFILLED_SYRINGE | INTRAVENOUS | Status: DC | PRN
  Administered 2023-12-20: 30 mg via INTRAVENOUS

## 2023-12-20 MED ORDER — FENTANYL CITRATE (PF) 250 MCG/5ML IJ SOLN
INTRAMUSCULAR | Status: AC
  Filled 2023-12-20: qty 5

## 2023-12-20 MED ORDER — DEXAMETHASONE SODIUM PHOSPHATE 10 MG/ML IJ SOLN
INTRAMUSCULAR | Status: DC | PRN
Start: 2023-12-20 — End: 2023-12-20
  Administered 2023-12-20: 10 mg via INTRAVENOUS

## 2023-12-20 MED ORDER — LIDOCAINE-EPINEPHRINE 1 %-1:100000 IJ SOLN
INTRAMUSCULAR | Status: AC
  Filled 2023-12-20: qty 1

## 2023-12-20 MED ORDER — ONDANSETRON HCL 4 MG/2ML IJ SOLN: 4.0000 mg | Freq: Once | INTRAMUSCULAR | Status: DC | PRN

## 2023-12-20 SURGICAL SUPPLY — 29 items
CNTNR URN SCR LID CUP LEK RST (MISCELLANEOUS) ×1 IMPLANT
COVER BACK TABLE 60X90IN (DRAPES) ×2 IMPLANT
COVER MAYO STAND STRL (DRAPES) ×2 IMPLANT
DRAPE HALF SHEET 40X57 (DRAPES) ×2 IMPLANT
DRSG TELFA 3X8 NADH STRL (GAUZE/BANDAGES/DRESSINGS) ×1 IMPLANT
GAUZE SPONGE 4X4 12PLY STRL (GAUZE/BANDAGES/DRESSINGS) ×1 IMPLANT
GLOVE BIO SURGEON STRL SZ 6 (GLOVE) ×2 IMPLANT
GOWN STRL REUS W/TWL LRG LVL3 (GOWN DISPOSABLE) ×4 IMPLANT
GUARD TEETH (MISCELLANEOUS) ×1 IMPLANT
KIT BASIN OR (CUSTOM PROCEDURE TRAY) ×2 IMPLANT
KIT PROLARN PLUS GEL W/NDL (Prosthesis and Implant ENT) ×1 IMPLANT
KIT TURNOVER KIT B (KITS) ×2 IMPLANT
KNIFE SATALOFF SICKLE ENT 3X5 (ORTHOPEDIC DISPOSABLE SUPPLIES) ×1 IMPLANT
MANIFOLD NEPTUNE II (INSTRUMENTS) ×1 IMPLANT
MARKER SKIN DUAL TIP RULER LAB (MISCELLANEOUS) IMPLANT
NDL HYPO 25GX1X1/2 BEV (NEEDLE) IMPLANT
NEEDLE HYPO 25GX1X1/2 BEV (NEEDLE) ×2 IMPLANT
NS IRRIG 1000ML POUR BTL (IV SOLUTION) ×2 IMPLANT
PAD ARMBOARD POSITIONER FOAM (MISCELLANEOUS) ×4 IMPLANT
PATTIES SURGICAL .5 X.5 (GAUZE/BANDAGES/DRESSINGS) ×2 IMPLANT
POSITIONER HEAD DONUT 9IN (MISCELLANEOUS) ×2 IMPLANT
SET COLLECT BLD 25X3/4 12 (NEEDLE) ×1 IMPLANT
SOL ANTI FOG 6CC (MISCELLANEOUS) ×2 IMPLANT
SURGILUBE 2OZ TUBE FLIPTOP (MISCELLANEOUS) ×1 IMPLANT
SYR 3ML LL SCALE MARK (SYRINGE) ×1 IMPLANT
SYR TB 1ML LUER SLIP (SYRINGE) ×1 IMPLANT
TOWEL GREEN STERILE FF (TOWEL DISPOSABLE) ×4 IMPLANT
TUBE CONNECTING 12X1/4 (SUCTIONS) ×4 IMPLANT
WATER STERILE IRR 1000ML POUR (IV SOLUTION) ×2 IMPLANT

## 2023-12-20 NOTE — Transfer of Care (Signed)
 Immediate Anesthesia Transfer of Care Note  Patient: Jacob Chan  Procedure(s) Performed: MICROLARYNGOSCOPY, WITH PROCEDURE USING LASER BRONCHOSCOPY, RIGID MICROLARYNGOSCOPY, WITH VOCAL CORD INJECTION  Patient Location: PACU  Anesthesia Type:General  Level of Consciousness: awake, alert , and oriented  Airway & Oxygen Therapy: Patient Spontanous Breathing and Patient connected to nasal cannula oxygen  Post-op Assessment: Report given to RN and Post -op Vital signs reviewed and stable  Post vital signs: Reviewed and stable  Last Vitals:  Vitals Value Taken Time  BP 153/94 12/20/23 1056  Temp    Pulse 76 12/20/23 1059  Resp 18 12/20/23 1059  SpO2 90 % 12/20/23 1059  Vitals shown include unfiled device data.  Last Pain:  Vitals:   12/20/23 0814  PainSc: 0-No pain         Complications: No notable events documented.

## 2023-12-20 NOTE — Discharge Instructions (Signed)
 POST-OP Instructions  Vocal Cord Surgery This post-operative instruction sheet is designed to help you care for your voice/throat after surgery and help answer any of the common questions you may have. It is not entirely comprehensive, so if you have any questions, do not hesitate to call the office.   What to Expect: - It is common to have a sore throat for several days after surgery. You may have some tongue numbness/pain or taste changes as well - these are temporary but can take several weeks to resolve. - You can eat and drink anything you normally do - there are no restrictions due to surgery alone.  If you have been recommended any other type of diet, such as an acid reflux diet, you should continue that.  You may want to eat light meals the day of anesthesia to make sure you don't get nauseated. You may have some pain after surgery, even with pain medications. To make this pain more tolerable, you should use Tylenol  and/or ibuprofen. You can take 500mg -600mg  of tylenol  every 6 hours, alternating with to 400-600mg  of ibuprofen every 6 hours, so long as your regular doctor hasn't told you to avoid either of these medications. If you don't take acid reflux medication already, you should add a dose of famotidine  20mg  with any ibuprofen dose over 400mg . If you're not sure whether these medications are safe for you, you should ask Dr Vernie Piet or your regular doctor first (particularly if you have a heart condition or history of stomach ulcers or are on blood thinners). If it is more severe pain that doesn't respond to the medications above, you should request or use the prescription narcotic pain medicine you were given. (You can call the office to request a script if you need one.)  If you have to use narcotics, plan to use a stool softener as well.  - It is very important that you stay well-hydrated and drink plenty of fluids during the recovery period.  Getting dehydrated tends to worsen  post-operative pain. - Avoid tobacco products, spicy foods, excessive alcohol, or eating late at night as these may cause heartburn or reflux of stomach acid into the throat and may delay the healing process.  If you are on reflux medication already (such as Prilosec (omeprazole) or Nexium (esomeprazole)), continue it after surgery.  If you are not on anything regularly and have reflux symptoms in the post-operative period (heartburn, throat burning, excessive throat mucous or throat-clearing, sensation of a lump in your throat) then you can treat these symptoms with over-the-counter Pepcid  20-40mg . - Avoid coughing or throat clearing. If you have the urge to clear your throat, take a sip of water and a hard swallow instead. Drinking plenty of water can help alleviate the urge to clear the throat.  If you cannot control your cough, you may take the narcotic pain medicine as a cough suppressant - it works similar to codeine cough syrup to help decrease cough.  If this doesn't help or the cough is excessive, please call the office. - You can return to normal activity 24-48 hours after surgery; avoid intense exercise for the first week. - Call the office if you experience any of the following: Fever higher than 101 F Complete loss of voice Difficulty breathing or swallowing Bleeding from the mouth  Voice Use After Surgery: - Follow the voice rest instructions. Rest your voice for 48 hrs and this means that you need to use dry erase board or texting to communicate.  After 48 hrs, start to slowly use your voice and do some glides with your voice, gentle humming or singing to warm up.   Nutrition: - Some patients have less of an appetite after surgery, and it is ok to decrease food intake. However, it is not ok to decrease fluid intake. It is very important to continue to drink plenty of fluids. - Drinking fluids will help lessen throat discomfort. -You can eat and drink as you normally do, but limit any  foods that might cause acid reflux (fried foods, meat, caffeine or alcohol, acidic sauces or fruits).  You may want to eat light meals the day of anesthesia to make sure you don't get nauseated.  Safety Information for Giving and Taking Medications: - Each time you give a medication, read the label. - If your medication is in liquid form, do not measure liquid with a kitchen spoon. There are pediatric measuring devices available at the pharmacy. Ask for one when you get your prescription filled. - If you have questions, ask the pharmacist. - Do not take Tylenol  for pain if you have a history of liver problems. Check with your primary care doctor if you are uncertain. - Do not take ibuprofen for pain if you have a history of stomach bleeding or ulcers and have been told to avoid Non-Steroidal Anti-Inflammatory Drugs (NSAIDs).  Certain blood thinners can also interact with ibuprofen.  Check with your primary care doctor if you are uncertain.  Follow-up Appointment: You should see Dr Nicholai Willette approximately 2-3 weeks after your surgery. If you don't have an appointment, please call her office to schedule it.   You can resume all of your medications after this procedure. You should take Pepcid  prescribed to you to help with healing.

## 2023-12-20 NOTE — Op Note (Signed)
 OPERATIVE NOTE   PREOPERATIVE DIAGNOSIS: 1. Right vocal fold submucosal cyst 2. Dysphonia  3. Age-related vocal fold atrophy     POSTOPERATIVE DIAGNOSIS: 1. Right vocal fold submucosal cyst 2. 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 Direct microlaryngoscopy, rigid bronchoscopy and submucosal excision of Right vocal fold cyst using microflap technique 2. Vocal fold injection augmentation, bilateral, with Prolaryn Plus   SURGEON: Artice Last, MD   ANESTHESIA: General endotracheal    COMPLICATIONS: None.   ESTIMATED BLOOD LOSS: < 5 mL   INTRAOPERATIVE FINDINGS: Right true vocal fold submucosal cyst, filled with mucin    SPECIMEN: Right true vocal fold cyst   INDICATIONS AND CONSENT: 29 yoF with history of chronic dysphonia and evidence of right vocal fold cyst and vocal fold atrophy and glottic insufficiency on scope exam in the office. The procedure, risks, benefits, alternatives, potential complications, possible outcomes as well as the option of no treatment were reviewed with the patient who indicated their understanding and wished to proceed forward with the procedure. Informed consent was obtained.   DESCRIPTION OF PROCEDURE: On 12/20/2023 the patient was brought to the operating room by the anesthesia team. The patient was transferred onto the operating table and placed in supine position. After smooth induction of general endotracheal anesthesia, a surgeon initiated time-out was performed. Next, the head of the bed had been rotated 90 degrees. The patient was then draped in the appropriate fashion.   We first began by inserting a maxillary tooth guard over the maxillary dentition for protection during the procedure. The male Sataloff laryngoscope was then advanced into the oral cavity and oropharynx until the larynx was brought into view. Once the larynx was adequately visualized the patient was placed into suspension. A small amount of anterior neck  pressure was used to see the entire vocal folds. A 0-degree Hopkins endoscope was then used to visualize the larynx and photo documentation was obtained. Thorough examination revealed a large right true vocal fold submucosal lesion/cyst. Rigid bronchoscopy revealed no additional lesions in the subglottis or proximal trachea.  Photodocumentation was obtained.    We injected a small amount of 1% lidocaine  with epinephrine  1:100,000 at the periphery of the lesion.  Next a sickle knife was used to create a submucosal incision just lateral to the submucosal cyst.  Using a combination of microlaryngeal instruments and suction, a plane was created along the borders of the cyst and it was removed preserving the vocal fold mucosa, which was laid back down. Afrin soaked pledgets were used to clear off  blood tinged secretions. We then proceeded with the injection augmentation. Using Prolaryn Plus and a 0-degree rigid endoscope, we injected approximately 0.4 ml of Prolaryn Plus into paraglottic space on the left and then the right side. Excess filler was suctioned out.  Photo documentation was once again obtained. The laryngoscope was then taken out of suspension and removed under direct visualization without injury to the lips or gingiva. The maxillary tooth guard was removed and the dentition was noted to be without injury.   This completed the procedure. The patient tolerated the procedure well without any immediate post operative complications. All counts were correct x2 at the conclusion of the procedure.

## 2023-12-20 NOTE — Anesthesia Postprocedure Evaluation (Signed)
 Anesthesia Post Note  Patient: Jacob Chan  Procedure(s) Performed: MICROLARYNGOSCOPY, WITH PROCEDURE USING LASER BRONCHOSCOPY, RIGID (Throat) MICROLARYNGOSCOPY, WITH VOCAL CORD INJECTION (Throat)     Patient location during evaluation: PACU Anesthesia Type: General Level of consciousness: awake and alert, oriented and patient cooperative Pain management: pain level controlled Vital Signs Assessment: post-procedure vital signs reviewed and stable Respiratory status: spontaneous breathing, nonlabored ventilation and respiratory function stable Cardiovascular status: blood pressure returned to baseline and stable Postop Assessment: no apparent nausea or vomiting Anesthetic complications: no   No notable events documented.  Last Vitals:  Vitals:   12/20/23 1115 12/20/23 1130  BP: (!) 151/88 (!) 143/87  Pulse: 77 76  Resp: (!) 23 16  Temp:  36.4 C  SpO2: 99% 96%    Last Pain:  Vitals:   12/20/23 1130  PainSc: 2                  Jacquelyne Matte

## 2023-12-20 NOTE — Anesthesia Procedure Notes (Signed)
 Procedure Name: Intubation Date/Time: 12/20/2023 9:55 AM  Performed by: Marijke Guadiana A, CRNAPre-anesthesia Checklist: Patient identified, Emergency Drugs available, Suction available and Patient being monitored Patient Re-evaluated:Patient Re-evaluated prior to induction Oxygen Delivery Method: Circle System Utilized Preoxygenation: Pre-oxygenation with 100% oxygen Induction Type: IV induction Ventilation: Mask ventilation without difficulty Laryngoscope Size: Glidescope and 4 Grade View: Grade I Tube type: Oral Laser Tube: Laser Tube and Cuffed inflated with minimal occlusive pressure - saline Tube size: 6.0 mm Number of attempts: 1 Airway Equipment and Method: Stylet and Oral airway Placement Confirmation: ETT inserted through vocal cords under direct vision, positive ETCO2 and breath sounds checked- equal and bilateral Secured at: 24 cm Tube secured with: Tape Dental Injury: Teeth and Oropharynx as per pre-operative assessment

## 2023-12-20 NOTE — Interval H&P Note (Signed)
 History and Physical Interval Note:  12/20/2023 7:21 AM  Jacob Chan  has presented today for surgery, with the diagnosis of Vocal Fold Polyp , Dysphonia.  The various methods of treatment have been discussed with the patient and family. After consideration of risks, benefits and other options for treatment, the patient has consented to  Procedure(s) with comments: MICROLARYNGOSCOPY, WITH PROCEDURE USING LASER (N/A) - DML bronchoscopy and CO2 laser excision of the right vocal fold lesion, possible vocal fold injection augmentation BRONCHOSCOPY, RIGID (N/A) MICROLARYNGOSCOPY, WITH VOCAL CORD INJECTION (N/A) as a surgical intervention.  The patient's history has been reviewed, patient examined, no change in status, stable for surgery.  I have reviewed the patient's chart and labs.  Questions were answered to the patient's satisfaction.     Doreather Hoxworth

## 2023-12-21 ENCOUNTER — Encounter (HOSPITAL_COMMUNITY): Payer: Self-pay | Admitting: Otolaryngology

## 2023-12-21 ENCOUNTER — Other Ambulatory Visit: Payer: Self-pay | Admitting: Emergency Medicine

## 2023-12-21 LAB — SURGICAL PATHOLOGY

## 2023-12-21 MED ORDER — GLIPIZIDE 5 MG PO TABS: ORAL_TABLET | ORAL | 0 refills | Status: DC

## 2023-12-21 NOTE — Telephone Encounter (Signed)
 Copied from CRM [id number]. Topic: Clinical - Medication Refill >> Dec 21, 2023 11:51 AM [place] J wrote: Most Recent Primary Care Visit:  Provider: Elvira Hammersmith  Department: [facility]  Visit Type: OFFICE VISIT  Date: 10/09/2023  Medication: glipiZIDE  (GLUCOTROL ) 5 MG tablet  Has the patient contacted their pharmacy? No (Agent: If no, request that the patient contact the pharmacy for the refill. If patient does not wish to contact the pharmacy document the reason why and proceed with request.) (Agent: If yes, when and what did the pharmacy advise?)  Is this the correct pharmacy for this prescription? Yes If no, delete pharmacy and type the correct one.  This is the patient's preferred pharmacy:   [facility] - [place], [place] - [address] [place] [address] Phone: [phone] Fax: [phone]  Has the prescription been filled recently? No  Is the patient out of the medication? Yes  Has the patient been seen for an appointment in the last year OR does the patient have an upcoming appointment? Yes  Can we respond through MyChart? Yes  Agent: Please be advised that Rx refills may take up to 3 business days. We ask that you follow-up with your pharmacy.

## 2023-12-26 ENCOUNTER — Other Ambulatory Visit: Payer: Self-pay | Admitting: Emergency Medicine

## 2023-12-26 DIAGNOSIS — I10 Essential (primary) hypertension: Secondary | ICD-10-CM

## 2024-01-04 ENCOUNTER — Telehealth: Payer: Self-pay | Admitting: *Deleted

## 2024-01-04 NOTE — Telephone Encounter (Signed)
 No answer-No VM to confirm appt for 5/12 - HE 01/04/24

## 2024-01-08 ENCOUNTER — Ambulatory Visit (INDEPENDENT_AMBULATORY_CARE_PROVIDER_SITE_OTHER): Admitting: Otolaryngology

## 2024-01-08 ENCOUNTER — Encounter (INDEPENDENT_AMBULATORY_CARE_PROVIDER_SITE_OTHER): Payer: Self-pay | Admitting: Otolaryngology

## 2024-01-08 VITALS — BP 148/83 | HR 83

## 2024-01-08 DIAGNOSIS — J383 Other diseases of vocal cords: Secondary | ICD-10-CM

## 2024-01-08 DIAGNOSIS — J381 Polyp of vocal cord and larynx: Secondary | ICD-10-CM | POA: Diagnosis not present

## 2024-01-08 DIAGNOSIS — R490 Dysphonia: Secondary | ICD-10-CM

## 2024-01-08 DIAGNOSIS — R0982 Postnasal drip: Secondary | ICD-10-CM | POA: Diagnosis not present

## 2024-01-08 DIAGNOSIS — K219 Gastro-esophageal reflux disease without esophagitis: Secondary | ICD-10-CM

## 2024-01-08 NOTE — Patient Instructions (Signed)

## 2024-01-08 NOTE — Progress Notes (Unsigned)
 ENT Progress Note:  Update 01/08/24  Here for post-op f/u, s/p DML and microflap technique excision of the right VF polyp and CaHa injection augmentation, b/l on 12/20/23. Reports his voice is beginning to improve. No pain or dyspnea following surgery. 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     Records Reviewed:  Update last OV  Discussed the use of AI scribe software for clinical note transcription with the patient, who gave verbal consent to proceed.  History of Present Illness Jacob Chan is a 68 year old male who was seen a few months ago for dysphonia and a Right vocal cord cyst, returns for f/u.   He continues to be raspy hoarse. Saw cardiology for clearance and had an Echo which showed normal EF.  A scope examination when he was seen initially confirmed the presence of a large cyst on the right vocal cord. He had no new illnesses since his last office visit, no admissions or visits to ED.    Jacob Chan is a 68 year old male who presents for follow-up after vocal cord surgery.  He experiences discomfort and a sensation of a lump in his throat when swallowing, which began on the third day after he started talking post-surgery. This discomfort was noted around Sunday following the procedure.  He underwent a scope exam to assess the healing process of his vocal cords. A filler was placed in his vocal cords during surgery, which is expected to last between six to twelve months.  He mentioned having a cup of coffee for the first time since surgery and discussed the potential impact of caffeine on reflux. No current trouble with talking or swallowing.    Initial Evaluation  Reason for Consult: vocal fold lesion and dysphonia for years  HPI: Discussed the use of AI scribe software for clinical note transcription with the patient, who gave verbal consent to proceed.  History of Present Illness   The patient is a 35 yoM, hx of DM and kidney  stones, CKD, with a long-standing history of voice changes, presents with a chief complaint of persistent hoarseness. The voice changes began in the early 1990s, and the patient reports that the voice has remained raspy since then, initially sx were intermittent, but lately over the course of the last 2-3 years became constant. The patient reports no pain with talking or swallowing. No dysphagia or dyspnea.   The patient also reports shortness of breath and easy fatigability, but denies any swallowing difficulties. He has noticed an increase in belching and hiccupping after meals, which could potentially indicate gastroesophageal reflux. However, the patient has not been on any medication for reflux or heartburn for a long time.  The patient also reports postnasal drainage and a feeling of congestion, but is not currently using any medication for this. The patient has no history of intubation or surgery on the vocal cords. The only surgical history is a procedure for kidney stones over ten years ago.  The patient has a history of smoking a pipe for a short duration in his late teens or early twenties, but has not smoked since. The patient denies any known autoimmune conditions.  The patient's voice changes have affected his ability to participate in singing and public speaking, which he used to do regularly. The patient reports that voice rest seems to improve the quality of his voice.  The patient has not had any other significant medical events such as a  stroke or heart attack, and reports no known lung conditions.       Records Reviewed:  Office visit with Dr Fortunato Ill is a 68 y.o. male kindly referred by Dr. Vedia Geralds for evaluation of dysphonia.  Initial visit (07/2023): Patient reports: throat just generally feels sore, and constant post nasal drip (mucoid). In the morning, it is the worst because his throat seems very dry. Once cleared and drinks water, the voice is much better  but he does find his voice is "raspy" most of the day. Never loses his voice completely, and sometimes the voice is completely normal. Voice is not worse with use. This has been going on for "years and years." No antecedent event such as a URI or trauma or intubation. Thinks that drinking water or coffee helps his voice, and he reports that having his throat feel dry makes it worse. He has tried OTC meds (Lozenges, chlorseptic spray) and it all seems to help. He saw an ENT several years and did voice therapy, which did seem to help a lot. Water: 80 oz a day Coffee: 1 cup per day    Past Medical History:  Diagnosis Date   Diabetes mellitus    Diabetes mellitus without complication (HCC)    Phreesia 09/09/2020   GERD (gastroesophageal reflux disease)    Heart murmur    03/20/20: mild MR & mild-moderate AS with mean grad 19 mmHg, peak grad 33.4, AVA VIT 0.80 cm   Hypertension    Kidney stones     Past Surgical History:  Procedure Laterality Date   COLONOSCOPY     LITHOTRIPSY     3 yrs ago   MICROLARYNGOSCOPY W/VOCAL CORD INJECTION N/A 12/20/2023   Procedure: MICROLARYNGOSCOPY, WITH VOCAL CORD INJECTION;  Surgeon: Artice Last, MD;  Location: [facility] OR;  Service: ENT;  Laterality: N/A;   RIGID BRONCHOSCOPY N/A 12/20/2023   Procedure: BRONCHOSCOPY, RIGID;  Surgeon: Artice Last, MD;  Location: [facility] OR;  Service: ENT;  Laterality: N/A;    Family History  Problem Relation Age of Onset   Diabetes Mother    Heart disease Mother    Diabetes Father    Colon cancer Neg Hx    Esophageal cancer Neg Hx    Stomach cancer Neg Hx    Rectal cancer Neg Hx     Social History:  reports that he has never smoked. He has never used smokeless tobacco. He reports that he does not drink alcohol and does not use drugs.  Allergies: No Known Allergies  Medications: I have reviewed the patient's current medications.  The PMH, PSH, Medications, Allergies, and SH were reviewed and  updated.  ROS: Constitutional: Negative for fever, weight loss and weight gain. Cardiovascular: Negative for chest pain and dyspnea on exertion. Respiratory: Is not experiencing shortness of breath at rest. Gastrointestinal: Negative for nausea and vomiting. Neurological: Negative for headaches. Psychiatric: The patient is not nervous/anxious  There were no vitals taken for this visit.  PHYSICAL EXAM:  Exam: General: Well-developed, well-nourished Communication and Voice: raspy Respiratory Respiratory effort: Equal inspiration and expiration without stridor Cardiovascular Peripheral Vascular: Warm extremities with equal color/perfusion Eyes: No nystagmus with equal extraocular motion bilaterally Neuro/Psych/Balance: Patient oriented to person, place, and time; Appropriate mood and affect; Gait is intact with no imbalance; Cranial nerves I-XII are intact Head and Face Inspection: Normocephalic and atraumatic without mass or lesion Palpation: Facial skeleton intact without bony stepoffs Salivary Glands: No mass or tenderness Facial Strength: Facial motility symmetric and  full bilaterally ENT Pinna: External ear intact and fully developed External canal: Canal is patent with intact skin Tympanic Membrane: Clear and mobile External Nose: No scar or anatomic deformity Internal Nose: Septum is deviated to the left. No polyp, or purulence. Mucosal edema and erythema present.  Bilateral inferior turbinate hypertrophy.  Lips, Teeth, and gums: Mucosa and teeth intact and viable TMJ: No pain to palpation with full mobility Oral cavity/oropharynx: No erythema or exudate, no lesions present Nasopharynx: No mass or lesion with intact mucosa Hypopharynx: Intact mucosa without pooling of secretions Larynx Glottic: Full true vocal cord mobility post-op changes seen procedure note below for additional findings  Supraglottic: Normal appearing epiglottis and AE folds Interarytenoid Space:  Moderate pachydermia&edema Subglottic Space: Patent without lesion or edema Neck Neck and Trachea: Midline trachea without mass or lesion Thyroid: No mass or nodularity Lymphatics: No lymphadenopathy  Procedure:   Preoperative diagnosis: hoarseness VF atrophy R VF polyp/cyst s/p excision and injection augmentation  Postoperative diagnosis:   same   Findings: complete glottic closure, mucosal wave intact, VF edges straight, no supraglottic compression  Procedure: Flexible fiberoptic laryngoscopy with stroboscopy (16109)   Surgeon: Artice Last, MD  Anesthesia: Topical lidocaine  and Afrin  Complications: None  Condition is stable throughout exam  Indications and consent:   The patient presents to the clinic with hoarseness. All the risks, benefits, and potential complications were reviewed with the patient preoperatively and informed verbal consent was obtained.  Procedure: The patient was seated upright in the exam chair.   Topical lidocaine  and Afrin were applied to the nasal cavity. After adequate anesthesia had occurred, the flexible telescope with strobe capabilities was passed into the nasal cavity. The nasopharynx was patent without mass or lesion. The scope was passed behind the soft palate and directed toward the base of tongue. The base of tongue was visualized and was symmetric with no apparent masses or abnormal appearing tissue. There were no signs of a mass or pooling of secretions in the piriform sinuses. The supraglottic structures were normal.  The true vocal cords are mobile . The medial edges were straight. Closure was complete. Periodicity present. The mucosal wave and amplitude were intact and symmetric b/l. There is moderate interarytenoid pachydermia and post cricoid edema.   The laryngoscope was then slowly withdrawn and the patient tolerated the procedure well. There were no complications or blood loss.     Studies Reviewed:09/14/20 CLINICAL DATA:  Cough  and shortness of breath   EXAM: CHEST - 2 VIEW   COMPARISON:  October 20, 2009   FINDINGS: Lungs are clear. Heart size and pulmonary vascularity are normal. No adenopathy. There is slight anterior wedging of a midthoracic vertebral body.   IMPRESSION: Lungs clear.  Cardiac silhouette normal.  Surgical pathology 12/20/23 FINAL MICROSCOPIC DIAGNOSIS:   A. RIGHT VOCAL CORD, CYST, EXCISION:  Benign vocal cord epithelial lined cyst with adjacent stromal edema  compatible with vocal cord polyp (laryngeal nodule)  Negative for dysplasia and malignancy     Assessment/Plan: Encounter Diagnoses  Name Primary?   Dysphonia Yes   Glottic insufficiency    Age-related vocal fold atrophy    Vocal fold polyp    Lesion of vocal fold       Assessment and Plan    R Vocal Cord Cyst/lesion and hx of gradually worsening chronic dysphonia/raspy voice Chronic hoarseness and voice changes since the early 1990s. Examination revealed a sizable cyst on the right vocal cord, likely benign but causing significant disruption in vocal  fold closure and vibration based on strobe exam, and had evidence of VF atrophy on exam as well with findings c/w GERD LPR and post-nasal drainage. Discussed surgical removal and vocal fold injection augmentation to improve closure and reduce strain. Explained procedure involves general anesthesia, intubation, and no external incisions. Anticipated outcome is improved voice quality.  - risks and benefits of surgery discussed and he would like to proceed - Schedule for DML, CO2 laser excision of right VF polyp and b/l VF injection augmentation - Prescribe Pepcid  (famotidine ) 20 mg BID for reflux management - Prescribe Flonase  2 puffs b/l nares BID  for nasal drainage - Advise complete voice rest for 2-3 days post-surgery, followed by gradual increase in voice use with glides - Coordinate with surgery schedulers for a Friday appointment   Chronic Nasal Congestion Chronic  postnasal drainage and stuffy nose. Examination revealed nasal congestion and a deviated septum. - Prescribe Flonase  for nasal drainage - will consider systemic antihistamine post-op if warranted and no improvement in the future   GERD LPR Belching and hiccups after eating, and findings on scope exam today c/w GERD LPR. No recent history of medication for reflux. - Prescribe Pepcid  (famotidine ) 20 mg BID for reflux management  General Health Maintenance No history of stroke, myocardial infarction, or significant pulmonary issues. - Advised to inform primary care physician about upcoming surgery and ensure surgical clearance if needed  Follow-up - Provide an excuse note for today's visit - Coordinate with surgery schedulers for a Friday appointment - Advise to follow up with primary care physician for surgical clearance.     Update 01/08/2024  Assessment & Plan Vocal cord cyst R VF VF atrophy and glottic insufficiency chronic dysphonia  S/p excision and bilateral injection augmentation with CaHa Healing as expected with complete glottic closure. Mucosal wave intact, no scar tissue on strobe exam today. Closure improved, enhancing vocal quality. Filler injected during surgery expected to last 6-12 months on average - Schedule follow-up in 9 months to reassess healing and voice quality - Advise on voice hygiene: avoid overexertion, maintain hydration, manage reflux and postnasal drainage. - Permit normal voice use, caution against whispering and shouting. - Encouraged hydration during extensive voice use, such as preaching. - Discussed potential impact of caffeine on reflux; advise moderation.   Chronic Nasal Congestion PND Chronic postnasal drainage and stuffy nose. Examination revealed nasal congestion and a deviated septum. - continue with Flonase   GERD LPR Belching and hiccups after eating, and findings on scope exam today c/w GERD LPR. No recent history of medication for  reflux. - Prescribed Pepcid  (famotidine ) 20 mg BID for reflux management - trial of reflux gourmet after meals - diet and lifestyle changes     Artice Last, MD Otolaryngology [facility] Phone: [phone] Fax: [phone]    01/08/2024, 1:53 PM

## 2024-01-23 ENCOUNTER — Other Ambulatory Visit: Payer: Self-pay | Admitting: Emergency Medicine

## 2024-01-23 DIAGNOSIS — E1159 Type 2 diabetes mellitus with other circulatory complications: Secondary | ICD-10-CM

## 2024-01-23 NOTE — Telephone Encounter (Unsigned)
 Copied from CRM [id number]. Topic: Clinical - Medication Refill >> Jan 23, 2024  1:53 PM Jacob Chan wrote: Medication: metFORMIN  (GLUCOPHAGE -XR) 500 MG 24 hr tablet, (patient's insurance [employer] is requesting the maximum supply of 100 tablets)  Has the patient contacted their pharmacy? No (Agent: If no, request that the patient contact the pharmacy for the refill. If patient does not wish to contact the pharmacy document the reason why and proceed with request.) (Agent: If yes, when and what did the pharmacy advise?)  This is the patient's preferred pharmacy:  [facility] - [place], [place] - [address] [place] [address] Phone: [phone] Fax: [phone]    Is this the correct pharmacy for this prescription? Yes If no, delete pharmacy and type the correct one.   Has the prescription been filled recently? No  Is the patient out of the medication? Yes  Has the patient been seen for an appointment in the last year OR does the patient have an upcoming appointment? Yes  Can we respond through MyChart? Yes  Agent: Please be advised that Rx refills may take up to 3 business days. We ask that you follow-up with your pharmacy.

## 2024-01-25 MED ORDER — METFORMIN HCL ER 500 MG PO TB24: 500.0000 mg | ORAL_TABLET | Freq: Every day | ORAL | 2 refills | Status: DC

## 2024-03-27 ENCOUNTER — Other Ambulatory Visit: Payer: Self-pay | Admitting: Emergency Medicine

## 2024-04-17 DIAGNOSIS — E119 Type 2 diabetes mellitus without complications: Secondary | ICD-10-CM | POA: Diagnosis not present

## 2024-04-17 DIAGNOSIS — Z008 Encounter for other general examination: Secondary | ICD-10-CM | POA: Diagnosis not present

## 2024-04-19 ENCOUNTER — Other Ambulatory Visit: Payer: Self-pay | Admitting: Emergency Medicine

## 2024-04-19 DIAGNOSIS — E785 Hyperlipidemia, unspecified: Secondary | ICD-10-CM

## 2024-04-19 DIAGNOSIS — I152 Hypertension secondary to endocrine disorders: Secondary | ICD-10-CM

## 2024-04-19 NOTE — Telephone Encounter (Signed)
 Jacob Chan from [facility] wanted to know if it can be sent to 100 days supply as they cover for 100

## 2024-04-19 NOTE — Telephone Encounter (Signed)
 Copied from CRM [id number]. Topic: Clinical - Medication Refill >> Apr 19, 2024  9:50 AM Thersia C wrote: Medication:  Trixie from [facility] wanted to know if it can be sent to 100 days supply as they cover for 100  glipiZIDE  (GLUCOTROL ) 5 MG tablet  rosuvastatin  (CRESTOR ) 5 MG tablet  lisinopril  (ZESTRIL ) 20 MG tablet  metFORMIN  (GLUCOPHAGE -XR) 500 MG 24 hr tablet    Has the patient contacted their pharmacy? Yes (Agent: If no, request that the patient contact the pharmacy for the refill. If patient does not wish to contact the pharmacy document the reason why and proceed with request.) (Agent: If yes, when and what did the pharmacy advise?)  This is the patient's preferred pharmacy:  [facility] - [place], [place] - [address] [place] [address] Phone: [phone] Fax: [phone]  Is this the correct pharmacy for this prescription? Yes If no, delete pharmacy and type the correct one.   Has the prescription been filled recently? No  Is the patient out of the medication? Yes  Has the patient been seen for an appointment in the last year OR does the patient have an upcoming appointment? Yes  Can we respond through MyChart? Yes  Agent: Please be advised that Rx refills may take up to 3 business days. We ask that you follow-up with your pharmacy.

## 2024-04-25 MED ORDER — ROSUVASTATIN CALCIUM 5 MG PO TABS: 5.0000 mg | ORAL_TABLET | Freq: Every day | ORAL | 3 refills | Status: AC

## 2024-04-25 MED ORDER — METFORMIN HCL ER 500 MG PO TB24: 500.0000 mg | ORAL_TABLET | Freq: Every day | ORAL | 2 refills | Status: DC

## 2024-04-25 MED ORDER — GLIPIZIDE 5 MG PO TABS: ORAL_TABLET | ORAL | 0 refills | Status: DC

## 2024-04-25 MED ORDER — LISINOPRIL 20 MG PO TABS: 20.0000 mg | ORAL_TABLET | Freq: Every day | ORAL | 3 refills | Status: AC

## 2024-05-12 ENCOUNTER — Other Ambulatory Visit (INDEPENDENT_AMBULATORY_CARE_PROVIDER_SITE_OTHER): Payer: Self-pay | Admitting: Otolaryngology

## 2024-06-10 ENCOUNTER — Encounter: Payer: Self-pay | Admitting: Emergency Medicine

## 2024-06-10 ENCOUNTER — Ambulatory Visit (INDEPENDENT_AMBULATORY_CARE_PROVIDER_SITE_OTHER): Admitting: Emergency Medicine

## 2024-06-10 VITALS — BP 118/76 | HR 91 | Temp 98.5°F | Ht 69.0 in | Wt 184.6 lb

## 2024-06-10 DIAGNOSIS — Z1329 Encounter for screening for other suspected endocrine disorder: Secondary | ICD-10-CM

## 2024-06-10 DIAGNOSIS — Z0000 Encounter for general adult medical examination without abnormal findings: Secondary | ICD-10-CM | POA: Diagnosis not present

## 2024-06-10 DIAGNOSIS — E1159 Type 2 diabetes mellitus with other circulatory complications: Secondary | ICD-10-CM

## 2024-06-10 DIAGNOSIS — I350 Nonrheumatic aortic (valve) stenosis: Secondary | ICD-10-CM | POA: Diagnosis not present

## 2024-06-10 DIAGNOSIS — E1169 Type 2 diabetes mellitus with other specified complication: Secondary | ICD-10-CM | POA: Diagnosis not present

## 2024-06-10 DIAGNOSIS — Z0001 Encounter for general adult medical examination with abnormal findings: Secondary | ICD-10-CM

## 2024-06-10 DIAGNOSIS — E785 Hyperlipidemia, unspecified: Secondary | ICD-10-CM

## 2024-06-10 DIAGNOSIS — Z125 Encounter for screening for malignant neoplasm of prostate: Secondary | ICD-10-CM | POA: Diagnosis not present

## 2024-06-10 DIAGNOSIS — I152 Hypertension secondary to endocrine disorders: Secondary | ICD-10-CM | POA: Diagnosis not present

## 2024-06-10 DIAGNOSIS — N1831 Chronic kidney disease, stage 3a: Secondary | ICD-10-CM

## 2024-06-10 DIAGNOSIS — Z13228 Encounter for screening for other metabolic disorders: Secondary | ICD-10-CM | POA: Diagnosis not present

## 2024-06-10 DIAGNOSIS — Z130 Encounter for screening for diseases of the blood and blood-forming organs and certain disorders involving the immune mechanism: Secondary | ICD-10-CM | POA: Diagnosis not present

## 2024-06-10 LAB — LIPID PANEL
Cholesterol: 138 mg/dL (ref 0–200)
HDL: 52 mg/dL (ref >39.00)
LDL Cholesterol: 65 mg/dL (ref 0–99)
NonHDL: 85.84
Total CHOL/HDL Ratio: 3
Triglycerides: 104 mg/dL (ref 0.0–149.0)
VLDL: 20.8 mg/dL (ref 0.0–40.0)

## 2024-06-10 LAB — POCT GLYCOSYLATED HEMOGLOBIN (HGB A1C): Hemoglobin A1C: 7.4 % — AB (ref 4.0–5.6)

## 2024-06-10 LAB — CBC WITH DIFFERENTIAL/PLATELET
Basophils Absolute: 0.1 K/uL (ref 0.0–0.1)
Basophils Relative: 1.1 % (ref 0.0–3.0)
Eosinophils Absolute: 0.2 K/uL (ref 0.0–0.7)
Eosinophils Relative: 2.9 % (ref 0.0–5.0)
HCT: 42.6 % (ref 39.0–52.0)
Hemoglobin: 13.8 g/dL (ref 13.0–17.0)
Lymphocytes Relative: 23 % (ref 12.0–46.0)
Lymphs Abs: 1.4 K/uL (ref 0.7–4.0)
MCHC: 32.5 g/dL (ref 30.0–36.0)
MCV: 92.8 fl (ref 78.0–100.0)
Monocytes Absolute: 0.6 K/uL (ref 0.1–1.0)
Monocytes Relative: 9.7 % (ref 3.0–12.0)
Neutro Abs: 3.9 K/uL (ref 1.4–7.7)
Neutrophils Relative %: 63.3 % (ref 43.0–77.0)
Platelets: 188 K/uL (ref 150.0–400.0)
RBC: 4.59 Mil/uL (ref 4.22–5.81)
RDW: 13.1 % (ref 11.5–15.5)
WBC: 6.2 K/uL (ref 4.0–10.5)

## 2024-06-10 LAB — COMPREHENSIVE METABOLIC PANEL WITH GFR
ALT: 18 U/L (ref 0–53)
AST: 23 U/L (ref 0–37)
Albumin: 4.5 g/dL (ref 3.5–5.2)
Alkaline Phosphatase: 57 U/L (ref 39–117)
BUN: 22 mg/dL (ref 6–23)
CO2: 26 meq/L (ref 19–32)
Calcium: 9.1 mg/dL (ref 8.4–10.5)
Chloride: 105 meq/L (ref 96–112)
Creatinine, Ser: 1.27 mg/dL (ref 0.40–1.50)
GFR: 57.63 mL/min — ABNORMAL LOW (ref >60.00)
Glucose, Bld: 54 mg/dL — ABNORMAL LOW (ref 70–99)
Potassium: 4.3 meq/L (ref 3.5–5.1)
Sodium: 139 meq/L (ref 135–145)
Total Bilirubin: 0.4 mg/dL (ref 0.2–1.2)
Total Protein: 6.8 g/dL (ref 6.0–8.3)

## 2024-06-10 LAB — MICROALBUMIN / CREATININE URINE RATIO
Creatinine,U: 127.3 mg/dL
Microalb Creat Ratio: 111.6 mg/g — ABNORMAL HIGH (ref 0.0–30.0)
Microalb, Ur: 14.2 mg/dL — ABNORMAL HIGH (ref 0.0–1.9)

## 2024-06-10 NOTE — Progress Notes (Signed)
 Quintin JONETTA Glatter 68 y.o.   Chief Complaint  Patient presents with   Annual Exam    Patient here for physical. Patient mentions that he's been having left knee pain since April, and right hand swelling since April. He did have that cyst removed and just wanted to follow up on it     HISTORY OF PRESENT ILLNESS: This is a 68 y.o. male here for annual exam and follow-up on chronic medical conditions including diabetes and hypertension Overall doing well. Since her last visit he was able to get surgery on vocal cord cyst went well. Has no complaints or any other medical concerns today.  HPI   Prior to Admission medications   Medication Sig Start Date End Date Taking? Authorizing Provider  acetaminophen  (TYLENOL ) 500 MG tablet Take 1,000 mg by mouth every 6 (six) hours as needed for mild pain (pain score 1-3) or moderate pain (pain score 4-6). PAIN   Yes [provider]  amLODipine  (NORVASC ) 5 MG tablet take one tablet daily in the morning. 12/26/23  Yes Hero Kulish, Emil Schanz, MD  Ascorbic Acid (VITAMIN C WITH ROSE HIPS PO) Take 1,000 mg by mouth daily.   Yes [provider]  aspirin EC 81 MG tablet Take 81 mg by mouth daily.   Yes [provider]  diclofenac  sodium (VOLTAREN ) 1 % GEL Apply 2 g topically 2 (two) times daily. Patient taking differently: Apply 2 g topically daily as needed (pain). 02/18/19  Yes Lyndzee Kliebert, Emil Schanz, MD  famotidine  (PEPCID ) 20 MG tablet TAKE 1 TABLET BY MOUTH TWICE A DAY 05/15/24  Yes Soldatova, Liuba, MD  glipiZIDE  (GLUCOTROL ) 5 MG tablet TAKE 1 TABLET ONCE A DAY BEFORE BREAKFAST. Follow-up appt due in June must see MD refills 04/25/24  Yes Tannia Contino, Emil Schanz, MD  lisinopril  (ZESTRIL ) 20 MG tablet Take 1 tablet (20 mg total) by mouth daily. 04/25/24  Yes Olivianna Higley, Emil Schanz, MD  metFORMIN  (GLUCOPHAGE -XR) 500 MG 24 hr tablet Take 1 tablet (500 mg total) by mouth daily. 04/25/24 04/20/25 Yes Keenan Trefry, Emil Schanz, MD  Multiple  Vitamins-Minerals (CENTRUM SILVER PO) Take 1 tablet by mouth daily. Men 50+   Yes [provider]  rosuvastatin  (CRESTOR ) 5 MG tablet Take 1 tablet (5 mg total) by mouth daily. 04/25/24  Yes Khalila Buechner, Emil Schanz, MD  tadalafil  (CIALIS ) 20 MG tablet take 1 tablet (20 MILLIGRAM total) by mouth daily as needed for erectile dysfunction. 11/25/23  Yes Lamarion Mcevers, Emil Schanz, MD  dapagliflozin  propanediol (FARXIGA ) 10 MG TABS tablet Take 1 tablet (10 mg total) by mouth daily before breakfast. Patient not taking: Reported on 06/10/2024 12/04/23   Lorielle Boehning Jose, MD  fluticasone  (FLONASE ) 50 MCG/ACT nasal spray Place 2 sprays into both nostrils daily. Patient not taking: Reported on 06/10/2024 08/16/23   Okey Burns, MD    No Known Allergies  Patient Active Problem List   Diagnosis Date Noted   Vocal cord cyst 12/20/2023   Age-related vocal fold atrophy 12/20/2023   Stage 3a chronic kidney disease (HCC) 02/20/2023   Diverticulosis 02/15/2021   Dysphonia 02/15/2021   Nonrheumatic aortic valve stenosis 02/15/2021   Hypertension associated with diabetes (HCC) 12/20/2016   Dyslipidemia associated with type 2 diabetes mellitus (HCC) 12/20/2016    Past Medical History:  Diagnosis Date   Diabetes mellitus    Diabetes mellitus without complication (HCC)    Phreesia 09/09/2020   GERD (gastroesophageal reflux disease)    Heart murmur    03/20/20: mild MR & mild-moderate AS  with mean grad 19 mmHg, peak grad 33.4, AVA VIT 0.80 cm   Hypertension    Kidney stones     Past Surgical History:  Procedure Laterality Date   COLONOSCOPY     LITHOTRIPSY     3 yrs ago   MICROLARYNGOSCOPY W/VOCAL CORD INJECTION N/A 12/20/2023   Procedure: MICROLARYNGOSCOPY, WITH VOCAL CORD INJECTION;  Surgeon: Okey Burns, MD;  Location: [facility] OR;  Service: ENT;  Laterality: N/A;   RIGID BRONCHOSCOPY N/A 12/20/2023   Procedure: BRONCHOSCOPY, RIGID;  Surgeon: Okey Burns, MD;  Location: [facility] OR;   Service: ENT;  Laterality: N/A;    Social History   Socioeconomic History   Marital status: Married    Spouse name: [name]   Number of children: 4   Years of education: Not on file   Highest education level: Not on file  Occupational History   Not on file  Tobacco Use   Smoking status: Never   Smokeless tobacco: Never  Vaping Use   Vaping status: Never Used  Substance and Sexual Activity   Alcohol use: No   Drug use: No   Sexual activity: Yes  Other Topics Concern   Not on file  Social History Narrative   Not on file   Social Drivers of Health   Financial Resource Strain: Low Risk  (04/21/2023)   Overall Financial Resource Strain (CARDIA)    Difficulty of Paying Living Expenses: Not hard at all  Food Insecurity: No Food Insecurity (04/21/2023)   Hunger Vital Sign    Worried About Running Out of Food in the Last Year: Never true    Ran Out of Food in the Last Year: Never true  Transportation Needs: No Transportation Needs (04/21/2023)   PRAPARE - Administrator, Civil Service (Medical): No    Lack of Transportation (Non-Medical): No  Physical Activity: Sufficiently Active (04/21/2023)   Exercise Vital Sign    Days of Exercise per Week: 5 days    Minutes of Exercise per Session: 40 min  Stress: Stress Concern Present (04/21/2023)   [employer] of Occupational Health - Occupational Stress Questionnaire    Feeling of Stress : To some extent  Social Connections: Socially Integrated (04/21/2023)   Social Connection and Isolation Panel    Frequency of Communication with Friends and Family: Twice a week    Frequency of Social Gatherings with Friends and Family: Three times a week    Attends Religious Services: More than 4 times per year    Active Member of Clubs or Organizations: Yes    Attends Banker Meetings: More than 4 times per year    Marital Status: Married  Catering manager Violence: Not At Risk (04/21/2023)   Humiliation, Afraid, Rape,  and Kick questionnaire    Fear of Current or Ex-Partner: No    Emotionally Abused: No    Physically Abused: No    Sexually Abused: No    Family History  Problem Relation Age of Onset   Diabetes Mother    Heart disease Mother    Diabetes Father    Colon cancer Neg Hx    Esophageal cancer Neg Hx    Stomach cancer Neg Hx    Rectal cancer Neg Hx      Review of Systems  Constitutional: Negative.  Negative for chills and fever.  HENT: Negative.  Negative for congestion and sore throat.   Respiratory: Negative.  Negative for cough and shortness of breath.   Cardiovascular: Negative.  Negative  for chest pain and palpitations.  Gastrointestinal:  Negative for abdominal pain, diarrhea, nausea and vomiting.  Genitourinary: Negative.  Negative for dysuria and hematuria.  Skin: Negative.  Negative for rash.  Neurological: Negative.  Negative for dizziness and headaches.  All other systems reviewed and are negative.   Vitals:   06/10/24 1527  BP: 118/76  Pulse: 91  Temp: 98.5 F (36.9 C)  SpO2: 94%    Physical Exam Vitals reviewed.  Constitutional:      Appearance: Normal appearance.  HENT:     Head: Normocephalic.     Right Ear: Tympanic membrane, ear canal and external ear normal.     Left Ear: Tympanic membrane, ear canal and external ear normal.     Mouth/Throat:     Mouth: Mucous membranes are moist.     Pharynx: Oropharynx is clear.  Eyes:     Extraocular Movements: Extraocular movements intact.     Pupils: Pupils are equal, round, and reactive to light.  Neck:     Vascular: No carotid bruit.  Cardiovascular:     Rate and Rhythm: Normal rate and regular rhythm.     Pulses: Normal pulses.     Heart sounds: Murmur heard.  Pulmonary:     Effort: Pulmonary effort is normal.     Breath sounds: Normal breath sounds.  Abdominal:     Palpations: Abdomen is soft.     Tenderness: There is no abdominal tenderness.  Musculoskeletal:     Cervical back: Neck supple.      Right lower leg: No edema.     Left lower leg: No edema.  Lymphadenopathy:     Cervical: No cervical adenopathy.  Skin:    General: Skin is warm and dry.     Capillary Refill: Capillary refill takes less than 2 seconds.  Neurological:     General: No focal deficit present.     Mental Status: He is alert and oriented to person, place, and time.  Psychiatric:        Mood and Affect: Mood normal.        Behavior: Behavior normal.    Results for orders placed or performed in visit on 06/10/24 (from the past 24 hours)  POCT HgB A1C     Status: Abnormal   Collection Time: 06/10/24  4:02 PM  Result Value Ref Range   Hemoglobin A1C 7.4 (A) 4.0 - 5.6 %   HbA1c POC (<> result, manual entry)     HbA1c, POC (prediabetic range)     HbA1c, POC (controlled diabetic range)       ASSESSMENT & PLAN: Problem List Items Addressed This Visit       Cardiovascular and Mediastinum   Hypertension associated with diabetes (HCC)   BP Readings from Last 3 Encounters:  06/10/24 118/76  01/08/24 (!) 148/83  12/20/23 (!) 143/87   Lab Results  Component Value Date   HGBA1C 7.4 (A) 06/10/2024  Well-controlled hypertension Recommend to continue amlodipine  5 mg daily and lisinopril  20 mg daily Hemoglobin A1c higher than before 7.4 Cardiovascular risks associated with uncontrolled diabetes discussed Diet and nutrition discussed Continue glipizide  5 mg daily and metformin  500 mg daily Not taking Farxiga  due to cost. Follow-up in 6 months       Relevant Orders   Microalbumin / creatinine urine ratio   CBC with Differential/Platelet   Comprehensive metabolic panel with GFR   Lipid panel   Nonrheumatic aortic valve stenosis   Stable. Asymptomatic  Endocrine   Dyslipidemia associated with type 2 diabetes mellitus (HCC)   Chronic stable condition Continue rosuvastatin  5 mg daily Diet and nutrition discussed      Relevant Orders   POCT HgB A1C (Completed)   Microalbumin / creatinine  urine ratio   CBC with Differential/Platelet   Comprehensive metabolic panel with GFR   Lipid panel     Genitourinary   Stage 3a chronic kidney disease (HCC)   Advised to stay well-hydrated and avoid NSAIDs Will benefit from starting Farxiga  10 mg daily.  Not taking it due to cost      Other Visit Diagnoses       Encounter for general adult medical examination with abnormal findings    -  Primary   Relevant Orders   CBC with Differential/Platelet   Comprehensive metabolic panel with GFR     Screening for prostate cancer       Relevant Orders   PSA     Screening for deficiency anemia       Relevant Orders   CBC with Differential/Platelet     Screening for endocrine, metabolic and immunity disorder       Relevant Orders   Comprehensive metabolic panel with GFR      Modifiable risk factors discussed with patient. Anticipatory guidance according to age provided. The following topics were also discussed: Social Determinants of Health Smoking.  Non-smoker Diet and nutrition Benefits of exercise Cancer screening and review of colonoscopy report from 2024 Vaccinations review and recommendations Cardiovascular risk assessment The 10-year ASCVD risk score (Arnett DK, et al., 2019) is: 25.3%   Values used to calculate the score:     Age: 55 years     Clincally relevant sex: Male     Is Non-Hispanic African American: Yes     Diabetic: Yes     Tobacco smoker: No     Systolic Blood Pressure: 118 mmHg     Is BP treated: Yes     HDL Cholesterol: 52.7 mg/dL     Total Cholesterol: 138 mg/dL Review of multiple chronic medical conditions under management Review of all medications Mental health including depression and anxiety Fall and accident prevention  Patient Instructions  Health Maintenance After Age 11 After age 52, you are at a higher risk for certain long-term diseases and infections as well as injuries from falls. Falls are a major cause of broken bones and head  injuries in people who are older than age 4. Getting regular preventive care can help to keep you healthy and well. Preventive care includes getting regular testing and making lifestyle changes as recommended by your health care provider. Talk with your health care provider about: Which screenings and tests you should have. A screening is a test that checks for a disease when you have no symptoms. A diet and exercise plan that is right for you. What should I know about screenings and tests to prevent falls? Screening and testing are the best ways to find a health problem early. Early diagnosis and treatment give you the best chance of managing medical conditions that are common after age 38. Certain conditions and lifestyle choices may make you more likely to have a fall. Your health care provider may recommend: Regular vision checks. Poor vision and conditions such as cataracts can make you more likely to have a fall. If you wear glasses, make sure to get your prescription updated if your vision changes. Medicine review. Work with your health care provider to regularly review  all of the medicines you are taking, including over-the-counter medicines. Ask your health care provider about any side effects that may make you more likely to have a fall. Tell your health care provider if any medicines that you take make you feel dizzy or sleepy. Strength and balance checks. Your health care provider may recommend certain tests to check your strength and balance while standing, walking, or changing positions. Foot health exam. Foot pain and numbness, as well as not wearing proper footwear, can make you more likely to have a fall. Screenings, including: Osteoporosis screening. Osteoporosis is a condition that causes the bones to get weaker and break more easily. Blood pressure screening. Blood pressure changes and medicines to control blood pressure can make you feel dizzy. Depression screening. You may be more  likely to have a fall if you have a fear of falling, feel depressed, or feel unable to do activities that you used to do. Alcohol use screening. Using too much alcohol can affect your balance and may make you more likely to have a fall. Follow these instructions at home: Lifestyle Do not drink alcohol if: Your health care provider tells you not to drink. If you drink alcohol: Limit how much you have to: 0-1 drink a day for women. 0-2 drinks a day for men. Know how much alcohol is in your drink. In the [place] one drink equals one 12 oz bottle of beer (355 mL), one 5 oz glass of wine (148 mL), or one 1 oz glass of hard liquor (44 mL). Do not use any products that contain nicotine or tobacco. These products include cigarettes, chewing tobacco, and vaping devices, such as e-cigarettes. If you need help quitting, ask your health care provider. Activity  Follow a regular exercise program to stay fit. This will help you maintain your balance. Ask your health care provider what types of exercise are appropriate for you. If you need a cane or walker, use it as recommended by your health care provider. Wear supportive shoes that have nonskid soles. Safety  Remove any tripping hazards, such as rugs, cords, and clutter. Install safety equipment such as grab bars in bathrooms and safety rails on stairs. Keep rooms and walkways well-lit. General instructions Talk with your health care provider about your risks for falling. Tell your health care provider if: You fall. Be sure to tell your health care provider about all falls, even ones that seem minor. You feel dizzy, tiredness (fatigue), or off-balance. Take over-the-counter and prescription medicines only as told by your health care provider. These include supplements. Eat a healthy diet and maintain a healthy weight. A healthy diet includes low-fat dairy products, low-fat (lean) meats, and fiber from whole grains, beans, and lots of fruits and  vegetables. Stay current with your vaccines. Schedule regular health, dental, and eye exams. Summary Having a healthy lifestyle and getting preventive care can help to protect your health and wellness after age 51. Screening and testing are the best way to find a health problem early and help you avoid having a fall. Early diagnosis and treatment give you the best chance for managing medical conditions that are more common for people who are older than age 100. Falls are a major cause of broken bones and head injuries in people who are older than age 21. Take precautions to prevent a fall at home. Work with your health care provider to learn what changes you can make to improve your health and wellness and to prevent falls. This  information is not intended to replace advice given to you by your health care provider. Make sure you discuss any questions you have with your health care provider. Document Revised: 01/04/2021 Document Reviewed: 01/04/2021 Elsevier Patient Education  2024 Elsevier Inc.     Emil Schaumann, MD [facility]

## 2024-06-10 NOTE — Assessment & Plan Note (Signed)
 Chronic stable condition Continue rosuvastatin  5 mg daily Diet and nutrition discussed

## 2024-06-10 NOTE — Assessment & Plan Note (Signed)
 Advised to stay well-hydrated and avoid NSAIDs Will benefit from starting Farxiga  10 mg daily.  Not taking it due to cost

## 2024-06-10 NOTE — Assessment & Plan Note (Signed)
 BP Readings from Last 3 Encounters:  06/10/24 118/76  01/08/24 (!) 148/83  12/20/23 (!) 143/87   Lab Results  Component Value Date   HGBA1C 7.4 (A) 06/10/2024  Well-controlled hypertension Recommend to continue amlodipine  5 mg daily and lisinopril  20 mg daily Hemoglobin A1c higher than before 7.4 Cardiovascular risks associated with uncontrolled diabetes discussed Diet and nutrition discussed Continue glipizide  5 mg daily and metformin  500 mg daily Not taking Farxiga  due to cost. Follow-up in 6 months

## 2024-06-10 NOTE — Patient Instructions (Signed)
 Health Maintenance After Age 69 After age 27, you are at a higher risk for certain long-term diseases and infections as well as injuries from falls. Falls are a major cause of broken bones and head injuries in people who are older than age 73. Getting regular preventive care can help to keep you healthy and well. Preventive care includes getting regular testing and making lifestyle changes as recommended by your health care provider. Talk with your health care provider about: Which screenings and tests you should have. A screening is a test that checks for a disease when you have no symptoms. A diet and exercise plan that is right for you. What should I know about screenings and tests to prevent falls? Screening and testing are the best ways to find a health problem early. Early diagnosis and treatment give you the best chance of managing medical conditions that are common after age 90. Certain conditions and lifestyle choices may make you more likely to have a fall. Your health care provider may recommend: Regular vision checks. Poor vision and conditions such as cataracts can make you more likely to have a fall. If you wear glasses, make sure to get your prescription updated if your vision changes. Medicine review. Work with your health care provider to regularly review all of the medicines you are taking, including over-the-counter medicines. Ask your health care provider about any side effects that may make you more likely to have a fall. Tell your health care provider if any medicines that you take make you feel dizzy or sleepy. Strength and balance checks. Your health care provider may recommend certain tests to check your strength and balance while standing, walking, or changing positions. Foot health exam. Foot pain and numbness, as well as not wearing proper footwear, can make you more likely to have a fall. Screenings, including: Osteoporosis screening. Osteoporosis is a condition that causes  the bones to get weaker and break more easily. Blood pressure screening. Blood pressure changes and medicines to control blood pressure can make you feel dizzy. Depression screening. You may be more likely to have a fall if you have a fear of falling, feel depressed, or feel unable to do activities that you used to do. Alcohol  use screening. Using too much alcohol  can affect your balance and may make you more likely to have a fall. Follow these instructions at home: Lifestyle Do not drink alcohol  if: Your health care provider tells you not to drink. If you drink alcohol : Limit how much you have to: 0-1 drink a day for women. 0-2 drinks a day for men. Know how much alcohol  is in your drink. In the [place] one drink equals one 12 oz bottle of beer (355 mL), one 5 oz glass of wine (148 mL), or one 1 oz glass of hard liquor (44 mL). Do not use any products that contain nicotine or tobacco. These products include cigarettes, chewing tobacco, and vaping devices, such as e-cigarettes. If you need help quitting, ask your health care provider. Activity  Follow a regular exercise program to stay fit. This will help you maintain your balance. Ask your health care provider what types of exercise are appropriate for you. If you need a cane or walker, use it as recommended by your health care provider. Wear supportive shoes that have nonskid soles. Safety  Remove any tripping hazards, such as rugs, cords, and clutter. Install safety equipment such as grab bars in bathrooms and safety rails on stairs. Keep rooms and walkways  well-lit. General instructions Talk with your health care provider about your risks for falling. Tell your health care provider if: You fall. Be sure to tell your health care provider about all falls, even ones that seem minor. You feel dizzy, tiredness (fatigue), or off-balance. Take over-the-counter and prescription medicines only as told by your health care provider. These include  supplements. Eat a healthy diet and maintain a healthy weight. A healthy diet includes low-fat dairy products, low-fat (lean) meats, and fiber from whole grains, beans, and lots of fruits and vegetables. Stay current with your vaccines. Schedule regular health, dental, and eye exams. Summary Having a healthy lifestyle and getting preventive care can help to protect your health and wellness after age 15. Screening and testing are the best way to find a health problem early and help you avoid having a fall. Early diagnosis and treatment give you the best chance for managing medical conditions that are more common for people who are older than age 42. Falls are a major cause of broken bones and head injuries in people who are older than age 64. Take precautions to prevent a fall at home. Work with your health care provider to learn what changes you can make to improve your health and wellness and to prevent falls. This information is not intended to replace advice given to you by your health care provider. Make sure you discuss any questions you have with your health care provider. Document Revised: 01/04/2021 Document Reviewed: 01/04/2021 Elsevier Patient Education  2024 [employer].

## 2024-06-10 NOTE — Assessment & Plan Note (Signed)
 Stable. Asymptomatic.

## 2024-06-11 ENCOUNTER — Ambulatory Visit: Payer: Self-pay | Admitting: Emergency Medicine

## 2024-06-11 DIAGNOSIS — N1831 Chronic kidney disease, stage 3a: Secondary | ICD-10-CM

## 2024-06-11 DIAGNOSIS — E1159 Type 2 diabetes mellitus with other circulatory complications: Secondary | ICD-10-CM

## 2024-06-11 LAB — PSA: PSA: 1.63 ng/mL (ref 0.10–4.00)

## 2024-06-11 MED ORDER — EMPAGLIFLOZIN 10 MG PO TABS: 10.0000 mg | ORAL_TABLET | Freq: Every day | ORAL | 3 refills | Status: AC

## 2024-06-20 ENCOUNTER — Other Ambulatory Visit: Payer: Self-pay | Admitting: Emergency Medicine

## 2024-06-20 MED ORDER — GLIPIZIDE 5 MG PO TABS: ORAL_TABLET | ORAL | 0 refills | Status: DC

## 2024-06-20 NOTE — Telephone Encounter (Signed)
 Copied from CRM [id number]. Topic: Clinical - Medication Refill >> Jun 20, 2024  9:31 AM Burnard DEL wrote: Medication: glipiZIDE  (GLUCOTROL ) 5 MG tablet  Has the patient contacted their pharmacy? Yes (Agent: If no, request that the patient contact the pharmacy for the refill. If patient does not wish to contact the pharmacy document the reason why and proceed with request.) (Agent: If yes, when and what did the pharmacy advise?)  This is the patient's preferred pharmacy:  [facility] - [place], [place] - [address] [place] [address] Phone: [phone] Fax: [phone]   Is this the correct pharmacy for this prescription? Yes If no, delete pharmacy and type the correct one.   Has the prescription been filled recently? No  Is the patient out of the medication? Yes  Has the patient been seen for an appointment in the last year OR does the patient have an upcoming appointment? Yes  Can we respond through MyChart? Yes  Agent: Please be advised that Rx refills may take up to 3 business days. We ask that you follow-up with your pharmacy.

## 2024-07-01 ENCOUNTER — Telehealth: Payer: Self-pay

## 2024-07-01 NOTE — Telephone Encounter (Signed)
 Voicemail is full unable to leave one

## 2024-07-01 NOTE — Telephone Encounter (Signed)
 Copied from CRM [id number]. Topic: Clinical - Medication Question >> Jul 01, 2024  3:25 PM Deaijah H wrote: Reason for CRM: Patient would like to speak with Dr. Purcell nurse regarding a prescription. Please call [phone]

## 2024-07-02 NOTE — Telephone Encounter (Signed)
 I have called patient again and was unable to leave V.S

## 2024-07-18 ENCOUNTER — Other Ambulatory Visit: Payer: Self-pay | Admitting: Emergency Medicine

## 2024-07-18 DIAGNOSIS — E1159 Type 2 diabetes mellitus with other circulatory complications: Secondary | ICD-10-CM

## 2024-07-18 MED ORDER — GLIPIZIDE 5 MG PO TABS: ORAL_TABLET | ORAL | 0 refills | Status: AC

## 2024-07-18 MED ORDER — METFORMIN HCL ER 500 MG PO TB24: 500.0000 mg | ORAL_TABLET | Freq: Every day | ORAL | 2 refills | Status: AC

## 2024-07-18 NOTE — Telephone Encounter (Signed)
 Copied from CRM #[id number]. Topic: Clinical - Medication Refill >> Jul 18, 2024 11:30 AM Suzen RAMAN wrote: Medication: glipiZIDE  (GLUCOTROL ) 5 MG tablet metFORMIN  (GLUCOPHAGE -XR) 500 MG 24 hr tablet **100 day supply**  Has the patient contacted their pharmacy? Yes   This is the patient's preferred pharmacy:  [facility] - [place], [place] - [address] [place] [address] Phone: [phone] Fax: [phone]  Is this the correct pharmacy for this prescription? Yes If no, delete pharmacy and type the correct one.   Has the prescription been filled recently? No  Is the patient out of the medication? No  Has the patient been seen for an appointment in the last year OR does the patient have an upcoming appointment? Yes  Can we respond through MyChart? Yes  Agent: Please be advised that Rx refills may take up to 3 business days. We ask that you follow-up with your pharmacy.

## 2024-09-04 ENCOUNTER — Telehealth: Payer: Self-pay

## 2024-09-04 NOTE — Telephone Encounter (Signed)
 He needs to call pharmacy and his insurance to find out what are the covered alternative.

## 2024-09-10 ENCOUNTER — Telehealth: Payer: Self-pay

## 2024-09-10 NOTE — Telephone Encounter (Signed)
 Copied from CRM [id number]. Topic: General - Other >> Sep 10, 2024  3:37 PM Berneda FALCON wrote: Reason for CRM: Patient is requesting to speak to Dr. Lebron CMA about an appt. He did not wish to disclose more information, just wanted to speak to her to see if he should or should not schedule.  Please call him back at [phone] (home) [phone] (work)

## 2024-09-13 NOTE — Telephone Encounter (Signed)
 Called patient and we spoke in regards to his upcoming apt that he wants to keep the same. Also patient is still having troubles with getting his farxiga  and I advised him that if he cannot get a response or resolved with his insurance we can get him scheduled to speak with out pharmacist who can help or guide in the right direction about his farxiga  since the cost is to much

## 2024-09-24 ENCOUNTER — Other Ambulatory Visit: Payer: Self-pay | Admitting: Emergency Medicine

## 2024-09-24 DIAGNOSIS — I152 Hypertension secondary to endocrine disorders: Secondary | ICD-10-CM

## 2024-12-09 ENCOUNTER — Ambulatory Visit: Admitting: Emergency Medicine
